# Patient Record
Sex: Female | Born: 1950 | Race: White | Hispanic: No | Marital: Single | Smoking: Never smoker
Health system: Southern US, Community
[De-identification: ages and names within clinical notes are randomized; demographics above are authoritative.]

## PROBLEM LIST (undated history)

## (undated) DIAGNOSIS — G4451 Hemicrania continua: Secondary | ICD-10-CM

## (undated) DIAGNOSIS — N189 Chronic kidney disease, unspecified: Secondary | ICD-10-CM

## (undated) DIAGNOSIS — H269 Unspecified cataract: Secondary | ICD-10-CM

## (undated) DIAGNOSIS — E785 Hyperlipidemia, unspecified: Secondary | ICD-10-CM

## (undated) DIAGNOSIS — E119 Type 2 diabetes mellitus without complications: Secondary | ICD-10-CM

## (undated) DIAGNOSIS — E039 Hypothyroidism, unspecified: Secondary | ICD-10-CM

## (undated) DIAGNOSIS — M1990 Unspecified osteoarthritis, unspecified site: Secondary | ICD-10-CM

## (undated) DIAGNOSIS — Z9889 Other specified postprocedural states: Secondary | ICD-10-CM

## (undated) DIAGNOSIS — H3530 Unspecified macular degeneration: Secondary | ICD-10-CM

## (undated) DIAGNOSIS — R112 Nausea with vomiting, unspecified: Secondary | ICD-10-CM

## (undated) DIAGNOSIS — I509 Heart failure, unspecified: Secondary | ICD-10-CM

## (undated) DIAGNOSIS — E079 Disorder of thyroid, unspecified: Secondary | ICD-10-CM

## (undated) DIAGNOSIS — M4810 Ankylosing hyperostosis [Forestier], site unspecified: Secondary | ICD-10-CM

## (undated) DIAGNOSIS — K219 Gastro-esophageal reflux disease without esophagitis: Secondary | ICD-10-CM

## (undated) DIAGNOSIS — I10 Essential (primary) hypertension: Secondary | ICD-10-CM

## (undated) DIAGNOSIS — S42209A Unspecified fracture of upper end of unspecified humerus, initial encounter for closed fracture: Secondary | ICD-10-CM

## (undated) HISTORY — DX: Disorder of thyroid, unspecified: E07.9

## (undated) HISTORY — DX: Unspecified cataract: H26.9

## (undated) HISTORY — DX: Ankylosing hyperostosis (forestier), site unspecified: M48.10

## (undated) HISTORY — PX: OTHER SURGICAL HISTORY: SHX169

## (undated) HISTORY — PX: FRACTURE SURGERY: SHX138

## (undated) HISTORY — PX: APPENDECTOMY: SHX54

## (undated) HISTORY — PX: CHOLECYSTECTOMY: SHX55

## (undated) HISTORY — DX: Chronic kidney disease, unspecified: N18.9

## (undated) HISTORY — DX: Heart failure, unspecified: I50.9

## (undated) HISTORY — PX: BREAST BIOPSY: SHX20

## (undated) HISTORY — PX: BREAST SURGERY: SHX581

## (undated) HISTORY — DX: Hyperlipidemia, unspecified: E78.5

## (undated) HISTORY — PX: JOINT REPLACEMENT: SHX530

---

## 1993-12-16 DIAGNOSIS — K819 Cholecystitis, unspecified: Secondary | ICD-10-CM | POA: Insufficient documentation

## 1998-03-17 DIAGNOSIS — G4451 Hemicrania continua: Secondary | ICD-10-CM | POA: Insufficient documentation

## 2009-09-01 HISTORY — PX: COLONOSCOPY: SHX174

## 2010-08-01 HISTORY — PX: COLONOSCOPY: SHX174

## 2014-12-17 DIAGNOSIS — E669 Obesity, unspecified: Secondary | ICD-10-CM | POA: Insufficient documentation

## 2014-12-17 DIAGNOSIS — E1169 Type 2 diabetes mellitus with other specified complication: Secondary | ICD-10-CM | POA: Insufficient documentation

## 2015-09-02 HISTORY — PX: COLONOSCOPY: SHX174

## 2015-09-18 DIAGNOSIS — Z9889 Other specified postprocedural states: Secondary | ICD-10-CM | POA: Insufficient documentation

## 2015-12-17 DIAGNOSIS — K37 Unspecified appendicitis: Secondary | ICD-10-CM | POA: Insufficient documentation

## 2017-01-13 DIAGNOSIS — E782 Mixed hyperlipidemia: Secondary | ICD-10-CM | POA: Diagnosis not present

## 2017-01-13 DIAGNOSIS — N816 Rectocele: Secondary | ICD-10-CM | POA: Diagnosis not present

## 2017-01-13 DIAGNOSIS — N649 Disorder of breast, unspecified: Secondary | ICD-10-CM | POA: Diagnosis not present

## 2017-01-13 DIAGNOSIS — Z6839 Body mass index (BMI) 39.0-39.9, adult: Secondary | ICD-10-CM | POA: Diagnosis not present

## 2017-01-13 DIAGNOSIS — E119 Type 2 diabetes mellitus without complications: Secondary | ICD-10-CM | POA: Diagnosis not present

## 2017-01-13 DIAGNOSIS — G4451 Hemicrania continua: Secondary | ICD-10-CM | POA: Diagnosis not present

## 2017-01-13 DIAGNOSIS — E6609 Other obesity due to excess calories: Secondary | ICD-10-CM | POA: Diagnosis not present

## 2017-01-13 DIAGNOSIS — E039 Hypothyroidism, unspecified: Secondary | ICD-10-CM | POA: Diagnosis not present

## 2017-01-13 DIAGNOSIS — H3530 Unspecified macular degeneration: Secondary | ICD-10-CM | POA: Diagnosis not present

## 2017-01-13 DIAGNOSIS — K219 Gastro-esophageal reflux disease without esophagitis: Secondary | ICD-10-CM | POA: Diagnosis not present

## 2017-01-30 DIAGNOSIS — E782 Mixed hyperlipidemia: Secondary | ICD-10-CM | POA: Diagnosis not present

## 2017-01-30 DIAGNOSIS — E039 Hypothyroidism, unspecified: Secondary | ICD-10-CM | POA: Diagnosis not present

## 2017-01-30 DIAGNOSIS — E119 Type 2 diabetes mellitus without complications: Secondary | ICD-10-CM | POA: Diagnosis not present

## 2017-02-03 DIAGNOSIS — E039 Hypothyroidism, unspecified: Secondary | ICD-10-CM | POA: Diagnosis not present

## 2017-02-03 DIAGNOSIS — G4451 Hemicrania continua: Secondary | ICD-10-CM | POA: Diagnosis not present

## 2017-02-03 DIAGNOSIS — H3530 Unspecified macular degeneration: Secondary | ICD-10-CM | POA: Diagnosis not present

## 2017-02-03 DIAGNOSIS — N816 Rectocele: Secondary | ICD-10-CM | POA: Diagnosis not present

## 2017-02-03 DIAGNOSIS — E6609 Other obesity due to excess calories: Secondary | ICD-10-CM | POA: Diagnosis not present

## 2017-02-03 DIAGNOSIS — I10 Essential (primary) hypertension: Secondary | ICD-10-CM | POA: Diagnosis not present

## 2017-02-03 DIAGNOSIS — E782 Mixed hyperlipidemia: Secondary | ICD-10-CM | POA: Diagnosis not present

## 2017-02-03 DIAGNOSIS — K219 Gastro-esophageal reflux disease without esophagitis: Secondary | ICD-10-CM | POA: Diagnosis not present

## 2017-02-03 DIAGNOSIS — Z681 Body mass index (BMI) 19 or less, adult: Secondary | ICD-10-CM | POA: Diagnosis not present

## 2017-02-03 DIAGNOSIS — E119 Type 2 diabetes mellitus without complications: Secondary | ICD-10-CM | POA: Diagnosis not present

## 2017-04-18 DIAGNOSIS — M4810 Ankylosing hyperostosis [Forestier], site unspecified: Secondary | ICD-10-CM | POA: Insufficient documentation

## 2017-05-08 DIAGNOSIS — E039 Hypothyroidism, unspecified: Secondary | ICD-10-CM | POA: Diagnosis not present

## 2017-05-08 DIAGNOSIS — E119 Type 2 diabetes mellitus without complications: Secondary | ICD-10-CM | POA: Diagnosis not present

## 2017-05-12 DIAGNOSIS — G4451 Hemicrania continua: Secondary | ICD-10-CM | POA: Diagnosis not present

## 2017-05-12 DIAGNOSIS — K219 Gastro-esophageal reflux disease without esophagitis: Secondary | ICD-10-CM | POA: Diagnosis not present

## 2017-05-12 DIAGNOSIS — E039 Hypothyroidism, unspecified: Secondary | ICD-10-CM | POA: Diagnosis not present

## 2017-05-12 DIAGNOSIS — E119 Type 2 diabetes mellitus without complications: Secondary | ICD-10-CM | POA: Diagnosis not present

## 2017-05-12 DIAGNOSIS — E782 Mixed hyperlipidemia: Secondary | ICD-10-CM | POA: Diagnosis not present

## 2017-05-12 DIAGNOSIS — Z6841 Body Mass Index (BMI) 40.0 and over, adult: Secondary | ICD-10-CM | POA: Diagnosis not present

## 2017-05-12 DIAGNOSIS — I10 Essential (primary) hypertension: Secondary | ICD-10-CM | POA: Diagnosis not present

## 2017-05-12 DIAGNOSIS — N6489 Other specified disorders of breast: Secondary | ICD-10-CM | POA: Diagnosis not present

## 2017-05-12 DIAGNOSIS — N816 Rectocele: Secondary | ICD-10-CM | POA: Diagnosis not present

## 2017-05-12 DIAGNOSIS — Z23 Encounter for immunization: Secondary | ICD-10-CM | POA: Diagnosis not present

## 2017-05-12 DIAGNOSIS — E6609 Other obesity due to excess calories: Secondary | ICD-10-CM | POA: Diagnosis not present

## 2017-05-12 DIAGNOSIS — H3530 Unspecified macular degeneration: Secondary | ICD-10-CM | POA: Diagnosis not present

## 2017-08-28 ENCOUNTER — Other Ambulatory Visit: Payer: Self-pay

## 2017-09-08 ENCOUNTER — Other Ambulatory Visit: Payer: Self-pay | Admitting: Internal Medicine

## 2017-09-08 DIAGNOSIS — Z1231 Encounter for screening mammogram for malignant neoplasm of breast: Secondary | ICD-10-CM

## 2017-10-01 ENCOUNTER — Ambulatory Visit
Admission: RE | Admit: 2017-10-01 | Discharge: 2017-10-01 | Disposition: A | Discharge status: Home / Self Care | Payer: Medicare Other | Source: Ambulatory Visit | Attending: Internal Medicine | Admitting: Internal Medicine

## 2017-10-01 DIAGNOSIS — Z1231 Encounter for screening mammogram for malignant neoplasm of breast: Secondary | ICD-10-CM | POA: Diagnosis not present

## 2017-10-09 DIAGNOSIS — E039 Hypothyroidism, unspecified: Secondary | ICD-10-CM | POA: Diagnosis not present

## 2017-10-09 DIAGNOSIS — E119 Type 2 diabetes mellitus without complications: Secondary | ICD-10-CM | POA: Diagnosis not present

## 2017-10-09 DIAGNOSIS — E782 Mixed hyperlipidemia: Secondary | ICD-10-CM | POA: Diagnosis not present

## 2017-10-13 DIAGNOSIS — G4451 Hemicrania continua: Secondary | ICD-10-CM | POA: Diagnosis not present

## 2017-10-13 DIAGNOSIS — Z0001 Encounter for general adult medical examination with abnormal findings: Secondary | ICD-10-CM | POA: Diagnosis not present

## 2017-10-13 DIAGNOSIS — K219 Gastro-esophageal reflux disease without esophagitis: Secondary | ICD-10-CM | POA: Diagnosis not present

## 2017-10-13 DIAGNOSIS — E039 Hypothyroidism, unspecified: Secondary | ICD-10-CM | POA: Diagnosis not present

## 2017-10-13 DIAGNOSIS — M79605 Pain in left leg: Secondary | ICD-10-CM | POA: Diagnosis not present

## 2017-10-13 DIAGNOSIS — H3530 Unspecified macular degeneration: Secondary | ICD-10-CM | POA: Diagnosis not present

## 2017-10-13 DIAGNOSIS — E782 Mixed hyperlipidemia: Secondary | ICD-10-CM | POA: Diagnosis not present

## 2017-10-13 DIAGNOSIS — I10 Essential (primary) hypertension: Secondary | ICD-10-CM | POA: Diagnosis not present

## 2017-10-13 DIAGNOSIS — E119 Type 2 diabetes mellitus without complications: Secondary | ICD-10-CM | POA: Diagnosis not present

## 2017-10-23 DIAGNOSIS — I10 Essential (primary) hypertension: Secondary | ICD-10-CM | POA: Diagnosis not present

## 2017-11-06 DIAGNOSIS — H353112 Nonexudative age-related macular degeneration, right eye, intermediate dry stage: Secondary | ICD-10-CM | POA: Diagnosis not present

## 2017-11-24 DIAGNOSIS — M79671 Pain in right foot: Secondary | ICD-10-CM | POA: Diagnosis not present

## 2017-11-24 DIAGNOSIS — E118 Type 2 diabetes mellitus with unspecified complications: Secondary | ICD-10-CM | POA: Diagnosis not present

## 2017-11-24 DIAGNOSIS — L851 Acquired keratosis [keratoderma] palmaris et plantaris: Secondary | ICD-10-CM | POA: Diagnosis not present

## 2017-11-24 DIAGNOSIS — Q6689 Other  specified congenital deformities of feet: Secondary | ICD-10-CM | POA: Diagnosis not present

## 2017-12-15 DIAGNOSIS — E118 Type 2 diabetes mellitus with unspecified complications: Secondary | ICD-10-CM | POA: Diagnosis not present

## 2017-12-15 DIAGNOSIS — M79671 Pain in right foot: Secondary | ICD-10-CM | POA: Diagnosis not present

## 2017-12-15 DIAGNOSIS — Q6689 Other  specified congenital deformities of feet: Secondary | ICD-10-CM | POA: Diagnosis not present

## 2018-03-29 ENCOUNTER — Encounter (HOSPITAL_COMMUNITY): Payer: Self-pay

## 2018-03-29 ENCOUNTER — Emergency Department (HOSPITAL_COMMUNITY)
Admission: EM | Admit: 2018-03-29 | Discharge: 2018-03-29 | Disposition: A | Discharge status: Home / Self Care | Payer: Medicare Other | Attending: Emergency Medicine | Admitting: Emergency Medicine

## 2018-03-29 ENCOUNTER — Emergency Department (HOSPITAL_COMMUNITY): Payer: Medicare Other

## 2018-03-29 DIAGNOSIS — S42352A Displaced comminuted fracture of shaft of humerus, left arm, initial encounter for closed fracture: Secondary | ICD-10-CM | POA: Diagnosis not present

## 2018-03-29 DIAGNOSIS — M25519 Pain in unspecified shoulder: Secondary | ICD-10-CM | POA: Diagnosis not present

## 2018-03-29 DIAGNOSIS — Y999 Unspecified external cause status: Secondary | ICD-10-CM | POA: Insufficient documentation

## 2018-03-29 DIAGNOSIS — W108XXA Fall (on) (from) other stairs and steps, initial encounter: Secondary | ICD-10-CM | POA: Diagnosis not present

## 2018-03-29 DIAGNOSIS — Y939 Activity, unspecified: Secondary | ICD-10-CM | POA: Insufficient documentation

## 2018-03-29 DIAGNOSIS — S49092A Other physeal fracture of upper end of humerus, left arm, initial encounter for closed fracture: Secondary | ICD-10-CM | POA: Insufficient documentation

## 2018-03-29 DIAGNOSIS — I10 Essential (primary) hypertension: Secondary | ICD-10-CM | POA: Insufficient documentation

## 2018-03-29 DIAGNOSIS — R609 Edema, unspecified: Secondary | ICD-10-CM | POA: Diagnosis not present

## 2018-03-29 DIAGNOSIS — Y929 Unspecified place or not applicable: Secondary | ICD-10-CM | POA: Diagnosis not present

## 2018-03-29 DIAGNOSIS — E119 Type 2 diabetes mellitus without complications: Secondary | ICD-10-CM | POA: Diagnosis not present

## 2018-03-29 DIAGNOSIS — R531 Weakness: Secondary | ICD-10-CM | POA: Diagnosis not present

## 2018-03-29 DIAGNOSIS — S42202A Unspecified fracture of upper end of left humerus, initial encounter for closed fracture: Secondary | ICD-10-CM | POA: Diagnosis not present

## 2018-03-29 DIAGNOSIS — S4292XA Fracture of left shoulder girdle, part unspecified, initial encounter for closed fracture: Secondary | ICD-10-CM

## 2018-03-29 DIAGNOSIS — S4992XA Unspecified injury of left shoulder and upper arm, initial encounter: Secondary | ICD-10-CM | POA: Diagnosis present

## 2018-03-29 HISTORY — DX: Hemicrania continua: G44.51

## 2018-03-29 HISTORY — DX: Essential (primary) hypertension: I10

## 2018-03-29 HISTORY — DX: Unspecified macular degeneration: H35.30

## 2018-03-29 HISTORY — DX: Type 2 diabetes mellitus without complications: E11.9

## 2018-03-29 HISTORY — DX: Gastro-esophageal reflux disease without esophagitis: K21.9

## 2018-03-29 HISTORY — DX: Unspecified osteoarthritis, unspecified site: M19.90

## 2018-03-29 MED ORDER — BACITRACIN ZINC 500 UNIT/GM EX OINT
1.0000 "application " | TOPICAL_OINTMENT | Freq: Two times a day (BID) | CUTANEOUS | Status: DC
  Administered 2018-03-29: 1 via TOPICAL
  Filled 2018-03-29: qty 0.9

## 2018-03-29 MED ORDER — HYDROMORPHONE HCL 1 MG/ML IJ SOLN
0.5000 mg | Freq: Once | INTRAMUSCULAR | Status: AC
Start: 2018-03-29 — End: 2018-03-29
  Administered 2018-03-29: 0.5 mg via INTRAMUSCULAR
  Filled 2018-03-29: qty 1

## 2018-03-29 MED ORDER — HYDROMORPHONE HCL 1 MG/ML IJ SOLN
1.0000 mg | Freq: Once | INTRAMUSCULAR | Status: AC
  Administered 2018-03-29: 1 mg via INTRAMUSCULAR
  Filled 2018-03-29: qty 1

## 2018-03-29 MED ORDER — OXYCODONE-ACETAMINOPHEN 5-325 MG PO TABS: 1.0000 | ORAL_TABLET | ORAL | 0 refills | Status: DC | PRN

## 2018-03-29 MED ORDER — ONDANSETRON 8 MG PO TBDP
8.0000 mg | ORAL_TABLET | Freq: Once | ORAL | Status: AC
  Administered 2018-03-29: 8 mg via ORAL
  Filled 2018-03-29: qty 1

## 2018-03-29 MED ORDER — OXYCODONE-ACETAMINOPHEN 5-325 MG PO TABS
ORAL_TABLET | ORAL | Status: AC
  Administered 2018-03-29: 1 via ORAL
  Filled 2018-03-29: qty 1

## 2018-03-29 MED ORDER — ONDANSETRON HCL 4 MG PO TABS: 4.0000 mg | ORAL_TABLET | Freq: Four times a day (QID) | ORAL | 0 refills | Status: DC

## 2018-03-29 MED ORDER — OXYCODONE-ACETAMINOPHEN 5-325 MG PO TABS
1.0000 | ORAL_TABLET | ORAL | Status: AC | PRN
  Administered 2018-03-29 (×2): 1 via ORAL

## 2018-03-29 NOTE — Discharge Instructions (Addendum)
I have spoken with Dr. [name] and he will see you in his office on Wednesday.  You can call his office tomorrow to arrange an appt time.  Continue to apply ice packs on/off.  Keep your arm in the sling.  Return here for any worsening symptoms

## 2018-03-29 NOTE — ED Provider Notes (Signed)
[facility] EMERGENCY DEPARTMENT Provider Note   CSN: [id number] Arrival date & time: 03/29/18  1537     History   Chief Complaint Chief Complaint  Patient presents with  . Fall  . Shoulder Pain    HPI Julie Bean is a 67 y.o. female.  HPI   Julie Bean is a 67 y.o. female who presents to the Emergency Department complaining of left shoulder pain after a mechanical fall down several brick steps, landing on her left arm.  She complains of severe pain to her left shoulder with movement.  She also complains of abrasions to her left knee and elbow.  She denies chest or rib pain, shortness of breath, low back or neck pain, head injury or LOC.  She denies numbness of the extremity.  Last TD is up-to-date.  She denies taking anticoagulants.   Past Medical History:  Diagnosis Date  . Arthritis    knees, shoulder, neck and hip  . Diabetes mellitus without complication ([place])   . GERD (gastroesophageal reflux disease)   . Headache, hemicrania continua   . Hypertension   . Macular degeneration     There are no active problems to display for this patient.   Past Surgical History:  Procedure Laterality Date  . APPENDECTOMY    . BREAST BIOPSY Left      OB History   None      Home Medications    Prior to Admission medications   Not on File    Family History No family history on file.  Social History Social History   Tobacco Use  . Smoking status: Never Smoker  Substance Use Topics  . Alcohol use: Never    Frequency: Never  . Drug use: Never     Allergies   Patient has no known allergies.   Review of Systems Review of Systems  Constitutional: Negative for chills and fever.  Eyes: Negative for visual disturbance.  Respiratory: Negative for shortness of breath.   Cardiovascular: Negative for chest pain.  Gastrointestinal: Negative for abdominal pain, nausea and vomiting.  Genitourinary: Negative for dysuria.  Musculoskeletal: Positive for  arthralgias (Left shoulder pain). Negative for back pain, joint swelling and neck pain.  Skin: Positive for wound. Negative for color change.       Abrasions left elbow and knee  Neurological: Negative for dizziness, syncope, weakness, numbness and headaches.  All other systems reviewed and are negative.    Physical Exam Updated Vital Signs BP 119/62 (BP Location: Right Arm)   Pulse 81   Temp 98.2 F (36.8 C) (Oral)   Resp 18   Ht 5\' 9"  (1.753 m)   Wt 124.7 kg (275 lb)   SpO2 94%   BMI 40.61 kg/m   Physical Exam  Constitutional: She is oriented to person, place, and time. She appears well-developed. No distress.  HENT:  Head: Atraumatic.  Mouth/Throat: Oropharynx is clear and moist.  Eyes: Pupils are equal, round, and reactive to light. Conjunctivae and EOM are normal.  Neck: Normal range of motion, full passive range of motion without pain and phonation normal. Neck supple. No spinous process tenderness and no muscular tenderness present. Normal range of motion present.  Cardiovascular: Normal rate, regular rhythm and intact distal pulses.  Pulmonary/Chest: Effort normal and breath sounds normal. No respiratory distress. She exhibits no tenderness.  Abdominal: Soft. She exhibits no distension. There is no tenderness.  Musculoskeletal: She exhibits tenderness. She exhibits no edema or deformity.  diffuse ttp of the left  anterior and lateral shoulder.  No bony deformity.  No edema.  No open wounds.  Left wrist and elbow are non-tender.  Compartments are soft.  No spinal tenderness on exam  Neurological: She is alert and oriented to person, place, and time. She has normal strength. No sensory deficit.  CN II-XII grossly intact.  Speech clear.    Skin: Skin is warm. Capillary refill takes less than 2 seconds.  Abrasions to the left anterior knee and lateral left elbow.  No hematoma, bleeding or edema.    Psychiatric: She has a normal mood and affect.  Nursing note and vitals  reviewed.    ED Treatments / Results  Labs (all labs ordered are listed, but only abnormal results are displayed) Labs Reviewed - No data to display  EKG None  Radiology Dg Shoulder Left  Result Date: 03/29/2018 CLINICAL DATA:  67 year old female s/p fall landing on shoulder. Severe pain. EXAM: LEFT SHOULDER - 2+ VIEW COMPARISON:  None. FINDINGS: Severely comminuted and impacted proximal left humerus fracture involving the head and neck. Butterfly fragments of the head appear distracted from the left glenohumeral joint. The fractured neck appears mildly impacted on the inferior glenoid. The left scapula and clavicle appear to remain intact. IMPRESSION: Severely comminuted and impacted proximal left humerus fracture. Electronically Signed   By: Genevie Ann M.D.   On: 03/29/2018 16:18    Procedures Procedures (including critical care time)  Medications Ordered in ED Medications  HYDROmorphone (DILAUDID) injection 1 mg (has no administration in time range)  ondansetron (ZOFRAN-ODT) disintegrating tablet 8 mg (has no administration in time range)  bacitracin ointment 1 application (has no administration in time range)  oxyCODONE-acetaminophen (PERCOCET/ROXICET) 5-325 MG per tablet 1 tablet (1 tablet Oral Given by Other 03/29/18 1545)     Initial Impression / Assessment and Plan / ED Course  I have reviewed the triage vital signs and the nursing notes.  Pertinent labs & imaging results that were available during my care of the patient were reviewed by me and considered in my medical decision making (see chart for details).    Patient with closed fracture of the left proximal humerus secondary to mechanical fall.  Neurovascularly intact.  Compartments are soft.  Left wrist and elbow are nontender.  Abrasions cleaned and bandaged.   [name]. Julie Bean.  Recommended sling and will see pt in office on Wednesday of this week.  Patient agrees to plan and verbalized  understanding    Final Clinical Impressions(s) / ED Diagnoses   Final diagnoses:  Closed fracture of left shoulder, initial encounter    ED Discharge Orders    None       Bufford Lope 03/30/18 0117    Dorie Rank, MD 03/30/18 2224

## 2018-03-29 NOTE — ED Triage Notes (Signed)
Pt brought in by EMS. Pt reports falling down 4 brick steps landing on left shoulder and side. Complaining  Of left shoulder pain and abraison to left knee

## 2018-03-30 ENCOUNTER — Other Ambulatory Visit: Payer: Self-pay | Admitting: Orthopaedic Surgery

## 2018-03-30 DIAGNOSIS — M25512 Pain in left shoulder: Principal | ICD-10-CM

## 2018-03-30 DIAGNOSIS — G8929 Other chronic pain: Secondary | ICD-10-CM

## 2018-03-30 DIAGNOSIS — Z6841 Body Mass Index (BMI) 40.0 and over, adult: Secondary | ICD-10-CM | POA: Diagnosis not present

## 2018-03-30 DIAGNOSIS — S42295A Other nondisplaced fracture of upper end of left humerus, initial encounter for closed fracture: Secondary | ICD-10-CM | POA: Diagnosis not present

## 2018-04-01 ENCOUNTER — Other Ambulatory Visit: Payer: Self-pay

## 2018-04-01 NOTE — Pre-Procedure Instructions (Signed)
Julie Bean  04/01/2018      [address], [place] [address] Phone: [phone] Fax: [phone]    Your procedure is scheduled on Wed., Aug. 7, 2019 from 3:28PM-5:56PM  Report to [facility] Admitting Entrance "A" at 1:25PM  Call this number if you have problems the morning of surgery:  [phone]   Remember:  Do not eat or drink after midnight on Aug. 6th    Take these medicines the morning of surgery with A SIP OF WATER: Divalproex (DEPAKOTE), Levothyroxine (SYNTHROID, LEVOTHROID), Omeprazole (PRILOSEC), and Ondansetron (ZOFRAN)   If needed OxyCODONE-acetaminophen (PERCOCET/ROXICET)   As of today, stop taking all Other Aspirin Products, Vitamins, Fish oils, and Herbal medications. Also stop all NSAIDS i.e. Advil, Ibuprofen, Motrin, Aleve, Anaprox, Naproxen, BC, Goody Powders, and all Supplements.  How to Manage Your Diabetes Before and After Surgery  Why is it important to control my blood sugar before and after surgery? . Improving blood sugar levels before and after surgery helps healing and can limit problems. . A way of improving blood sugar control is eating a healthy diet by: o  Eating less sugar and carbohydrates o  Increasing activity/exercise o  Talking with your doctor about reaching your blood sugar goals . High blood sugars (greater than 180 mg/dL) can raise your risk of infections and slow your recovery, so you will need to focus on controlling your diabetes during the weeks before surgery. . Make sure that the doctor who takes care of your diabetes knows about your planned surgery including the date and location.  How do I manage my blood sugar before surgery? . Check your blood sugar at least 4 times a day, starting 2 days before surgery, to make sure that the level is not too high or low. o Check your blood sugar the morning of your surgery when you wake up and every 2 hours  until you get to the Short Stay unit. . If your blood sugar is less than 70 mg/dL, you will need to treat for low blood sugar: o Do not take insulin. o Treat a low blood sugar (less than 70 mg/dL) with  cup of clear juice (cranberry or apple), 4 glucose tablets, OR glucose gel. Recheck blood sugar in 15 minutes after treatment (to make sure it is greater than 70 mg/dL). If your blood sugar is not greater than 70 mg/dL on recheck, call [phone] o  for further instructions. . Report your blood sugar to the short stay nurse when you get to Short Stay. .  If your CBG is greater than 220 mg/dL, call the number above for further instructions.  . If you are admitted to the hospital after surgery: o Your blood sugar will be checked by the staff and you will probably be given insulin after surgery (instead of oral diabetes medicines) to make sure you have good blood sugar levels. o The goal for blood sugar control after surgery is 80-180 mg/dL.  WHAT DO I DO ABOUT MY DIABETES MEDICATION?  Marland Kitchen Do not take MetFORMIN (GLUCOPHAGE) the morning of surgery.  Reviewed and Endorsed by [facility] Patient Education Committee, August 2015   Do not wear jewelry, make-up or nail polish.  Do not wear lotions, powders, or perfumes, or deodorant.  Do not shave 48 hours prior to surgery.    Do not bring valuables to the hospital.  [facility] is not responsible for any  belongings or valuables.  Contacts, dentures or bridgework may not be worn into surgery.  Leave your suitcase in the car.  After surgery it may be brought to your room.  For patients admitted to the hospital, discharge time will be determined by your treatment team.  Patients discharged the day of surgery will not be allowed to drive home.   Special instructions:   [facility]- Preparing For Surgery  Before surgery, you can play an important role. Because skin is not sterile, your skin needs to be as free of germs as possible. You can  reduce the number of germs on your skin by washing with CHG (chlorahexidine gluconate) Soap before surgery.  CHG is an antiseptic cleaner which kills germs and bonds with the skin to continue killing germs even after washing.    Oral Hygiene is also important to reduce your risk of infection.  Remember - BRUSH YOUR TEETH THE MORNING OF SURGERY WITH YOUR REGULAR TOOTHPASTE  Please do not use if you have an allergy to CHG or antibacterial soaps. If your skin becomes reddened/irritated stop using the CHG.  Do not shave (including legs and underarms) for at least 48 hours prior to first CHG shower. It is OK to shave your face.  Please follow these instructions carefully.   1. Shower the NIGHT BEFORE SURGERY and the MORNING OF SURGERY with CHG.   2. If you chose to wash your hair, wash your hair first as usual with your normal shampoo.  3. After you shampoo, rinse your hair and body thoroughly to remove the shampoo.  4. Use CHG as you would any other liquid soap. You can apply CHG directly to the skin and wash gently with a scrungie or a clean washcloth.   5. Apply the CHG Soap to your body ONLY FROM THE NECK DOWN.  Do not use on open wounds or open sores. Avoid contact with your eyes, ears, mouth and genitals (private parts). Wash Face and genitals (private parts)  with your normal soap.  6. Wash thoroughly, paying special attention to the area where your surgery will be performed.  7. Thoroughly rinse your body with warm water from the neck down.  8. DO NOT shower/wash with your normal soap after using and rinsing off the CHG Soap.  9. Pat yourself dry with a CLEAN TOWEL.  10. Wear CLEAN PAJAMAS to bed the night before surgery, wear comfortable clothes the morning of surgery  11. Place CLEAN SHEETS on your bed the night of your first shower and DO NOT SLEEP WITH PETS.  Day of Surgery:  Do not apply any deodorants/lotions.  Please wear clean clothes to the hospital/surgery center.    Remember to brush your teeth WITH YOUR REGULAR TOOTHPASTE.  Please read over the following fact sheets that you were given. Pain Booklet, Coughing and Deep Breathing, MRSA Information and Surgical Site Infection Prevention

## 2018-04-02 ENCOUNTER — Other Ambulatory Visit: Payer: Self-pay

## 2018-04-02 ENCOUNTER — Encounter (HOSPITAL_COMMUNITY)
Admission: RE | Admit: 2018-04-02 | Discharge: 2018-04-02 | Disposition: A | Discharge status: Home / Self Care | Payer: Medicare Other | Source: Ambulatory Visit | Attending: Orthopaedic Surgery | Admitting: Orthopaedic Surgery

## 2018-04-02 ENCOUNTER — Encounter (HOSPITAL_COMMUNITY): Payer: Self-pay

## 2018-04-02 DIAGNOSIS — R9431 Abnormal electrocardiogram [ECG] [EKG]: Secondary | ICD-10-CM | POA: Diagnosis not present

## 2018-04-02 DIAGNOSIS — Z01818 Encounter for other preprocedural examination: Secondary | ICD-10-CM | POA: Diagnosis not present

## 2018-04-02 DIAGNOSIS — S4292XA Fracture of left shoulder girdle, part unspecified, initial encounter for closed fracture: Secondary | ICD-10-CM | POA: Insufficient documentation

## 2018-04-02 DIAGNOSIS — Z01810 Encounter for preprocedural cardiovascular examination: Secondary | ICD-10-CM | POA: Diagnosis not present

## 2018-04-02 DIAGNOSIS — X58XXXA Exposure to other specified factors, initial encounter: Secondary | ICD-10-CM | POA: Insufficient documentation

## 2018-04-02 HISTORY — DX: Other specified postprocedural states: Z98.890

## 2018-04-02 HISTORY — DX: Unspecified fracture of upper end of unspecified humerus, initial encounter for closed fracture: S42.209A

## 2018-04-02 HISTORY — DX: Hypothyroidism, unspecified: E03.9

## 2018-04-02 HISTORY — DX: Nausea with vomiting, unspecified: R11.2

## 2018-04-02 LAB — BASIC METABOLIC PANEL
Anion gap: 15 (ref 5–15)
BUN: 36 mg/dL — ABNORMAL HIGH (ref 8–23)
CO2: 23 mmol/L (ref 22–32)
Calcium: 8.5 mg/dL — ABNORMAL LOW (ref 8.9–10.3)
Chloride: 94 mmol/L — ABNORMAL LOW (ref 98–111)
Creatinine, Ser: 1.01 mg/dL — ABNORMAL HIGH (ref 0.44–1.00)
GFR calc Af Amer: >60 mL/min (ref >60)
GFR calc non Af Amer: 57 mL/min — ABNORMAL LOW (ref >60)
Glucose, Bld: 147 mg/dL — ABNORMAL HIGH (ref 70–99)
Potassium: 4.7 mmol/L (ref 3.5–5.1)
Sodium: 132 mmol/L — ABNORMAL LOW (ref 135–145)

## 2018-04-02 LAB — CBC
HCT: 29.5 % — ABNORMAL LOW (ref 36.0–46.0)
Hemoglobin: 9.3 g/dL — ABNORMAL LOW (ref 12.0–15.0)
MCH: 29.8 pg (ref 26.0–34.0)
MCHC: 31.5 g/dL (ref 30.0–36.0)
MCV: 94.6 fL (ref 78.0–100.0)
Platelets: 240 10*3/uL (ref 150–400)
RBC: 3.12 MIL/uL — ABNORMAL LOW (ref 3.87–5.11)
RDW: 14.3 % (ref 11.5–15.5)
WBC: 9.7 10*3/uL (ref 4.0–10.5)

## 2018-04-02 LAB — SURGICAL PCR SCREEN
MRSA, PCR: NEGATIVE
Staphylococcus aureus: NEGATIVE

## 2018-04-02 LAB — GLUCOSE, CAPILLARY: Glucose-Capillary: 138 mg/dL — ABNORMAL HIGH (ref 70–99)

## 2018-04-02 LAB — HEMOGLOBIN A1C
Hgb A1c MFr Bld: 7.1 % — ABNORMAL HIGH (ref 4.8–5.6)
Mean Plasma Glucose: 157.07 mg/dL

## 2018-04-02 NOTE — Progress Notes (Signed)
PCP - Dr. Meade Maw  Cardiologist - Denies  Chest x-ray - Denies  EKG - 04/02/18  Stress Test - 20 yrs- Neg  ECHO - Denies  Cardiac Cath - Denies  Sleep Study - Denies CPAP - None  LABS- 04/02/18: CBC, BMP, PCR  ASA- Denies  HA1C- 04/02/18 Fasting Blood Sugar - 115-130, Today, 138 Checks Blood Sugar __1-2___ times a week  Anesthesia- No  Pt denies having chest pain, sob, or fever at this time. All instructions explained to the pt, with a verbal understanding of the material. Pt agrees to go over the instructions while at home for a better understanding. The opportunity to ask questions was provided.

## 2018-04-06 ENCOUNTER — Ambulatory Visit
Admission: RE | Admit: 2018-04-06 | Discharge: 2018-04-06 | Disposition: A | Discharge status: Home / Self Care | Payer: Medicare Other | Source: Ambulatory Visit | Attending: Orthopaedic Surgery | Admitting: Orthopaedic Surgery

## 2018-04-06 DIAGNOSIS — G8929 Other chronic pain: Secondary | ICD-10-CM

## 2018-04-06 DIAGNOSIS — M25512 Pain in left shoulder: Principal | ICD-10-CM

## 2018-04-06 DIAGNOSIS — S42212A Unspecified displaced fracture of surgical neck of left humerus, initial encounter for closed fracture: Secondary | ICD-10-CM | POA: Diagnosis not present

## 2018-04-06 MED ORDER — DEXTROSE 5 % IV SOLN
3.0000 g | INTRAVENOUS | Status: AC
  Administered 2018-04-07: 3 g via INTRAVENOUS
  Filled 2018-04-06: qty 3

## 2018-04-06 MED ORDER — TRANEXAMIC ACID 1000 MG/10ML IV SOLN
1000.0000 mg | INTRAVENOUS | Status: AC
  Administered 2018-04-07: 1000 mg via INTRAVENOUS
  Filled 2018-04-06: qty 1100

## 2018-04-07 ENCOUNTER — Other Ambulatory Visit: Payer: Self-pay

## 2018-04-07 ENCOUNTER — Encounter (HOSPITAL_COMMUNITY): Payer: Self-pay | Admitting: General Practice

## 2018-04-07 ENCOUNTER — Inpatient Hospital Stay (HOSPITAL_COMMUNITY)
Admission: RE | Admit: 2018-04-07 | Discharge: 2018-04-08 | DRG: 483 | Disposition: A | Discharge status: Home / Self Care | Payer: Medicare Other | Source: Ambulatory Visit | Attending: Orthopaedic Surgery | Admitting: Orthopaedic Surgery

## 2018-04-07 ENCOUNTER — Inpatient Hospital Stay (HOSPITAL_COMMUNITY): Payer: Medicare Other

## 2018-04-07 ENCOUNTER — Inpatient Hospital Stay (HOSPITAL_COMMUNITY): Payer: Medicare Other | Admitting: Anesthesiology

## 2018-04-07 ENCOUNTER — Encounter (HOSPITAL_COMMUNITY)
Admission: RE | Disposition: A | Discharge status: Home / Self Care | Payer: Self-pay | Source: Ambulatory Visit | Attending: Orthopaedic Surgery

## 2018-04-07 DIAGNOSIS — Z79890 Hormone replacement therapy (postmenopausal): Secondary | ICD-10-CM

## 2018-04-07 DIAGNOSIS — Z79899 Other long term (current) drug therapy: Secondary | ICD-10-CM

## 2018-04-07 DIAGNOSIS — S42202A Unspecified fracture of upper end of left humerus, initial encounter for closed fracture: Secondary | ICD-10-CM | POA: Diagnosis not present

## 2018-04-07 DIAGNOSIS — K219 Gastro-esophageal reflux disease without esophagitis: Secondary | ICD-10-CM | POA: Diagnosis not present

## 2018-04-07 DIAGNOSIS — Z9049 Acquired absence of other specified parts of digestive tract: Secondary | ICD-10-CM | POA: Diagnosis not present

## 2018-04-07 DIAGNOSIS — Z96612 Presence of left artificial shoulder joint: Secondary | ICD-10-CM | POA: Diagnosis not present

## 2018-04-07 DIAGNOSIS — E119 Type 2 diabetes mellitus without complications: Secondary | ICD-10-CM | POA: Diagnosis not present

## 2018-04-07 DIAGNOSIS — M65812 Other synovitis and tenosynovitis, left shoulder: Secondary | ICD-10-CM | POA: Diagnosis not present

## 2018-04-07 DIAGNOSIS — S42292A Other displaced fracture of upper end of left humerus, initial encounter for closed fracture: Secondary | ICD-10-CM | POA: Diagnosis not present

## 2018-04-07 DIAGNOSIS — G8918 Other acute postprocedural pain: Secondary | ICD-10-CM | POA: Diagnosis not present

## 2018-04-07 DIAGNOSIS — E039 Hypothyroidism, unspecified: Secondary | ICD-10-CM | POA: Diagnosis present

## 2018-04-07 DIAGNOSIS — H3530 Unspecified macular degeneration: Secondary | ICD-10-CM | POA: Diagnosis not present

## 2018-04-07 DIAGNOSIS — Z7984 Long term (current) use of oral hypoglycemic drugs: Secondary | ICD-10-CM

## 2018-04-07 DIAGNOSIS — Z419 Encounter for procedure for purposes other than remedying health state, unspecified: Secondary | ICD-10-CM

## 2018-04-07 DIAGNOSIS — I10 Essential (primary) hypertension: Secondary | ICD-10-CM | POA: Diagnosis not present

## 2018-04-07 DIAGNOSIS — Z471 Aftercare following joint replacement surgery: Secondary | ICD-10-CM | POA: Diagnosis not present

## 2018-04-07 DIAGNOSIS — W19XXXA Unspecified fall, initial encounter: Secondary | ICD-10-CM | POA: Diagnosis present

## 2018-04-07 DIAGNOSIS — Z09 Encounter for follow-up examination after completed treatment for conditions other than malignant neoplasm: Secondary | ICD-10-CM

## 2018-04-07 HISTORY — PX: REVERSE SHOULDER ARTHROPLASTY: SHX5054

## 2018-04-07 HISTORY — DX: Unspecified fracture of upper end of left humerus, initial encounter for closed fracture: S42.202A

## 2018-04-07 LAB — GLUCOSE, CAPILLARY
Glucose-Capillary: 114 mg/dL — ABNORMAL HIGH (ref 70–99)
Glucose-Capillary: 114 mg/dL — ABNORMAL HIGH (ref 70–99)
Glucose-Capillary: 143 mg/dL — ABNORMAL HIGH (ref 70–99)

## 2018-04-07 SURGERY — ARTHROPLASTY, SHOULDER, TOTAL, REVERSE
Anesthesia: Regional | Site: Shoulder | Laterality: Left

## 2018-04-07 MED ORDER — ROCURONIUM BROMIDE 10 MG/ML (PF) SYRINGE
PREFILLED_SYRINGE | INTRAVENOUS | Status: DC | PRN
  Administered 2018-04-07: 60 mg via INTRAVENOUS
  Administered 2018-04-07: 20 mg via INTRAVENOUS

## 2018-04-07 MED ORDER — DEXAMETHASONE SODIUM PHOSPHATE 10 MG/ML IJ SOLN
INTRAMUSCULAR | Status: AC
  Filled 2018-04-07: qty 1

## 2018-04-07 MED ORDER — HYDROMORPHONE HCL 1 MG/ML IJ SOLN: 0.5000 mg | INTRAMUSCULAR | Status: DC | PRN

## 2018-04-07 MED ORDER — HYDROCODONE-ACETAMINOPHEN 7.5-325 MG PO TABS
1.0000 | ORAL_TABLET | Freq: Once | ORAL | Status: DC | PRN
Start: 2018-04-07 — End: 2018-04-07

## 2018-04-07 MED ORDER — GLYCOPYRROLATE PF 0.2 MG/ML IJ SOSY
PREFILLED_SYRINGE | INTRAMUSCULAR | Status: AC
  Filled 2018-04-07: qty 1

## 2018-04-07 MED ORDER — BUPIVACAINE HCL (PF) 0.25 % IJ SOLN
INTRAMUSCULAR | Status: AC
  Filled 2018-04-07: qty 30

## 2018-04-07 MED ORDER — ONDANSETRON HCL 4 MG/2ML IJ SOLN
4.0000 mg | Freq: Four times a day (QID) | INTRAMUSCULAR | Status: DC | PRN
  Administered 2018-04-08: 4 mg via INTRAVENOUS
  Filled 2018-04-07: qty 2

## 2018-04-07 MED ORDER — LIDOCAINE 2% (20 MG/ML) 5 ML SYRINGE
INTRAMUSCULAR | Status: AC
  Filled 2018-04-07: qty 5

## 2018-04-07 MED ORDER — LEVOTHYROXINE SODIUM 75 MCG PO TABS
150.0000 ug | ORAL_TABLET | Freq: Every day | ORAL | Status: DC
  Administered 2018-04-08: 150 ug via ORAL
  Filled 2018-04-07: qty 2

## 2018-04-07 MED ORDER — CLONIDINE HCL (ANALGESIA) 100 MCG/ML EP SOLN
EPIDURAL | Status: DC | PRN
  Administered 2018-04-07: 50 ug

## 2018-04-07 MED ORDER — HYDROMORPHONE HCL 1 MG/ML IJ SOLN: 0.2500 mg | INTRAMUSCULAR | Status: DC | PRN

## 2018-04-07 MED ORDER — ESMOLOL HCL 100 MG/10ML IV SOLN
INTRAVENOUS | Status: DC | PRN
  Administered 2018-04-07: 40 mg via INTRAVENOUS

## 2018-04-07 MED ORDER — MIDAZOLAM HCL 2 MG/2ML IJ SOLN
1.0000 mg | Freq: Once | INTRAMUSCULAR | Status: AC
  Administered 2018-04-07: 1 mg via INTRAVENOUS

## 2018-04-07 MED ORDER — ROPIVACAINE HCL 5 MG/ML IJ SOLN
INTRAMUSCULAR | Status: DC | PRN
  Administered 2018-04-07: 30 mL via PERINEURAL

## 2018-04-07 MED ORDER — ZOLPIDEM TARTRATE 5 MG PO TABS
5.0000 mg | ORAL_TABLET | Freq: Every evening | ORAL | Status: DC | PRN
  Administered 2018-04-07: 5 mg via ORAL
  Filled 2018-04-07: qty 1

## 2018-04-07 MED ORDER — FENTANYL CITRATE (PF) 250 MCG/5ML IJ SOLN
INTRAMUSCULAR | Status: AC
  Filled 2018-04-07: qty 5

## 2018-04-07 MED ORDER — ACETAMINOPHEN 10 MG/ML IV SOLN: 1000.0000 mg | Freq: Once | INTRAVENOUS | Status: DC | PRN

## 2018-04-07 MED ORDER — SUGAMMADEX SODIUM 200 MG/2ML IV SOLN
INTRAVENOUS | Status: DC | PRN
  Administered 2018-04-07: 250 mg via INTRAVENOUS

## 2018-04-07 MED ORDER — DIPHENHYDRAMINE HCL 12.5 MG/5ML PO ELIX: 12.5000 mg | ORAL_SOLUTION | ORAL | Status: DC | PRN

## 2018-04-07 MED ORDER — PROPOFOL 10 MG/ML IV BOLUS
INTRAVENOUS | Status: AC
  Filled 2018-04-07: qty 20

## 2018-04-07 MED ORDER — CHLORHEXIDINE GLUCONATE 4 % EX LIQD: 60.0000 mL | Freq: Once | CUTANEOUS | Status: DC

## 2018-04-07 MED ORDER — PHENYLEPHRINE 40 MCG/ML (10ML) SYRINGE FOR IV PUSH (FOR BLOOD PRESSURE SUPPORT)
PREFILLED_SYRINGE | INTRAVENOUS | Status: AC
  Filled 2018-04-07: qty 10

## 2018-04-07 MED ORDER — PANTOPRAZOLE SODIUM 40 MG PO TBEC
80.0000 mg | DELAYED_RELEASE_TABLET | Freq: Every day | ORAL | Status: DC
  Administered 2018-04-07 – 2018-04-08 (×2): 80 mg via ORAL
  Filled 2018-04-07 (×2): qty 2

## 2018-04-07 MED ORDER — SODIUM CHLORIDE 0.9 % IV SOLN
INTRAVENOUS | Status: DC | PRN
  Administered 2018-04-07: 40 ug/min via INTRAVENOUS

## 2018-04-07 MED ORDER — PRAVASTATIN SODIUM 40 MG PO TABS
40.0000 mg | ORAL_TABLET | Freq: Every day | ORAL | Status: DC
  Administered 2018-04-07 – 2018-04-08 (×2): 40 mg via ORAL
  Filled 2018-04-07 (×2): qty 1

## 2018-04-07 MED ORDER — ONDANSETRON HCL 4 MG/2ML IJ SOLN
4.0000 mg | Freq: Once | INTRAMUSCULAR | Status: DC | PRN
Start: 2018-04-07 — End: 2018-04-07

## 2018-04-07 MED ORDER — EPHEDRINE SULFATE-NACL 50-0.9 MG/10ML-% IV SOSY
PREFILLED_SYRINGE | INTRAVENOUS | Status: DC | PRN
  Administered 2018-04-07 (×3): 10 mg via INTRAVENOUS

## 2018-04-07 MED ORDER — SUGAMMADEX SODIUM 500 MG/5ML IV SOLN
INTRAVENOUS | Status: AC
  Filled 2018-04-07: qty 5

## 2018-04-07 MED ORDER — IRBESARTAN 150 MG PO TABS
150.0000 mg | ORAL_TABLET | Freq: Every day | ORAL | Status: DC
  Administered 2018-04-07 – 2018-04-08 (×2): 150 mg via ORAL
  Filled 2018-04-07 (×2): qty 1

## 2018-04-07 MED ORDER — ACETAMINOPHEN 500 MG PO TABS
1000.0000 mg | ORAL_TABLET | Freq: Three times a day (TID) | ORAL | Status: DC
  Administered 2018-04-07 – 2018-04-08 (×2): 1000 mg via ORAL
  Filled 2018-04-07 (×2): qty 2

## 2018-04-07 MED ORDER — METFORMIN HCL 500 MG PO TABS
500.0000 mg | ORAL_TABLET | Freq: Two times a day (BID) | ORAL | Status: DC
  Administered 2018-04-08: 500 mg via ORAL
  Filled 2018-04-07: qty 1

## 2018-04-07 MED ORDER — DIVALPROEX SODIUM 500 MG PO DR TAB
500.0000 mg | DELAYED_RELEASE_TABLET | Freq: Two times a day (BID) | ORAL | Status: DC
  Administered 2018-04-07 – 2018-04-08 (×2): 500 mg via ORAL
  Filled 2018-04-07 (×2): qty 1

## 2018-04-07 MED ORDER — FENTANYL CITRATE (PF) 100 MCG/2ML IJ SOLN
INTRAMUSCULAR | Status: AC
  Administered 2018-04-07: 50 ug via INTRAVENOUS
  Filled 2018-04-07: qty 2

## 2018-04-07 MED ORDER — PROPOFOL 10 MG/ML IV BOLUS
INTRAVENOUS | Status: DC | PRN
  Administered 2018-04-07: 60 mg via INTRAVENOUS
  Administered 2018-04-07: 600 mg via INTRAVENOUS
  Administered 2018-04-07: 140 mg via INTRAVENOUS

## 2018-04-07 MED ORDER — OXYCODONE HCL 5 MG PO TABS
5.0000 mg | ORAL_TABLET | ORAL | Status: DC | PRN
  Administered 2018-04-08: 10 mg via ORAL
  Administered 2018-04-08: 5 mg via ORAL
  Administered 2018-04-08: 10 mg via ORAL
  Filled 2018-04-07 (×2): qty 2

## 2018-04-07 MED ORDER — ROCURONIUM BROMIDE 10 MG/ML (PF) SYRINGE
PREFILLED_SYRINGE | INTRAVENOUS | Status: AC
  Filled 2018-04-07: qty 10

## 2018-04-07 MED ORDER — DEXAMETHASONE SODIUM PHOSPHATE 10 MG/ML IJ SOLN
INTRAMUSCULAR | Status: DC | PRN
  Administered 2018-04-07: 5 mg via INTRAVENOUS

## 2018-04-07 MED ORDER — EPHEDRINE 5 MG/ML INJ
INTRAVENOUS | Status: AC
  Filled 2018-04-07: qty 10

## 2018-04-07 MED ORDER — BUPIVACAINE HCL (PF) 0.25 % IJ SOLN
INTRAMUSCULAR | Status: DC | PRN
  Administered 2018-04-07: 10 mL

## 2018-04-07 MED ORDER — HYDROCHLOROTHIAZIDE 25 MG PO TABS
12.5000 mg | ORAL_TABLET | Freq: Every day | ORAL | Status: DC
  Administered 2018-04-07: 12.5 mg via ORAL
  Filled 2018-04-07: qty 1

## 2018-04-07 MED ORDER — ONDANSETRON HCL 4 MG PO TABS: 4.0000 mg | ORAL_TABLET | Freq: Four times a day (QID) | ORAL | Status: DC | PRN

## 2018-04-07 MED ORDER — LACTATED RINGERS IV SOLN
INTRAVENOUS | Status: DC
  Administered 2018-04-07 (×2): via INTRAVENOUS

## 2018-04-07 MED ORDER — ONDANSETRON HCL 4 MG/2ML IJ SOLN
INTRAMUSCULAR | Status: AC
  Filled 2018-04-07: qty 2

## 2018-04-07 MED ORDER — METOCLOPRAMIDE HCL 5 MG/ML IJ SOLN
5.0000 mg | Freq: Three times a day (TID) | INTRAMUSCULAR | Status: DC | PRN
  Administered 2018-04-08: 10 mg via INTRAVENOUS
  Filled 2018-04-07: qty 2

## 2018-04-07 MED ORDER — MIDAZOLAM HCL 2 MG/2ML IJ SOLN
INTRAMUSCULAR | Status: AC
  Administered 2018-04-07: 1 mg via INTRAVENOUS
  Filled 2018-04-07: qty 2

## 2018-04-07 MED ORDER — CEFAZOLIN SODIUM-DEXTROSE 1-4 GM/50ML-% IV SOLN
1.0000 g | Freq: Four times a day (QID) | INTRAVENOUS | Status: AC
  Administered 2018-04-07 – 2018-04-08 (×3): 1 g via INTRAVENOUS
  Filled 2018-04-07 (×3): qty 50

## 2018-04-07 MED ORDER — FENTANYL CITRATE (PF) 250 MCG/5ML IJ SOLN
INTRAMUSCULAR | Status: DC | PRN
  Administered 2018-04-07: 50 ug via INTRAVENOUS
  Administered 2018-04-07: 100 ug via INTRAVENOUS
  Administered 2018-04-07: 50 ug via INTRAVENOUS

## 2018-04-07 MED ORDER — FENTANYL CITRATE (PF) 100 MCG/2ML IJ SOLN
50.0000 ug | Freq: Once | INTRAMUSCULAR | Status: AC
  Administered 2018-04-07: 50 ug via INTRAVENOUS

## 2018-04-07 MED ORDER — 0.9 % SODIUM CHLORIDE (POUR BTL) OPTIME
TOPICAL | Status: DC | PRN
  Administered 2018-04-07: 1000 mL

## 2018-04-07 MED ORDER — ONDANSETRON HCL 4 MG/2ML IJ SOLN
INTRAMUSCULAR | Status: DC | PRN
  Administered 2018-04-07: 4 mg via INTRAVENOUS

## 2018-04-07 MED ORDER — LIDOCAINE 2% (20 MG/ML) 5 ML SYRINGE
INTRAMUSCULAR | Status: DC | PRN
  Administered 2018-04-07: 100 mg via INTRAVENOUS

## 2018-04-07 MED ORDER — PHENYLEPHRINE 40 MCG/ML (10ML) SYRINGE FOR IV PUSH (FOR BLOOD PRESSURE SUPPORT)
PREFILLED_SYRINGE | INTRAVENOUS | Status: DC | PRN
  Administered 2018-04-07: 120 ug via INTRAVENOUS
  Administered 2018-04-07: 80 ug via INTRAVENOUS
  Administered 2018-04-07 (×3): 120 ug via INTRAVENOUS

## 2018-04-07 MED ORDER — METOCLOPRAMIDE HCL 5 MG PO TABS: 5.0000 mg | ORAL_TABLET | Freq: Three times a day (TID) | ORAL | Status: DC | PRN

## 2018-04-07 MED ORDER — SODIUM CHLORIDE 0.9 % IR SOLN
Status: DC | PRN
  Administered 2018-04-07: 1000 mL

## 2018-04-07 MED ORDER — DOCUSATE SODIUM 100 MG PO CAPS
100.0000 mg | ORAL_CAPSULE | Freq: Two times a day (BID) | ORAL | Status: DC
  Administered 2018-04-08: 100 mg via ORAL
  Filled 2018-04-07 (×2): qty 1

## 2018-04-07 MED ORDER — INDOMETHACIN 25 MG PO CAPS
50.0000 mg | ORAL_CAPSULE | Freq: Two times a day (BID) | ORAL | Status: DC
  Administered 2018-04-08: 50 mg via ORAL
  Filled 2018-04-07 (×2): qty 2

## 2018-04-07 MED ORDER — MEPERIDINE HCL 50 MG/ML IJ SOLN: 6.2500 mg | INTRAMUSCULAR | Status: DC | PRN

## 2018-04-07 SURGICAL SUPPLY — 63 items
BASEPLATE GLENOSPHERE 25 STD (Miscellaneous) ×2 IMPLANT
BIT DRILL 3.2 PERIPHERAL SCREW (BIT) ×2 IMPLANT
BLADE SAW SAG 73X25 THK (BLADE) ×1
BLADE SAW SGTL 73X25 THK (BLADE) ×1 IMPLANT
BODY PROXIMAL PTC 13X132.5 (Joint) ×1 IMPLANT
CAP LOCKING COCR (Cap) ×2 IMPLANT
CHLORAPREP W/TINT 26ML (MISCELLANEOUS) ×4 IMPLANT
COVER SURGICAL LIGHT HANDLE (MISCELLANEOUS) ×2 IMPLANT
DRAPE C-ARM 42X72 X-RAY (DRAPES) IMPLANT
DRAPE HALF SHEET 40X57 (DRAPES) ×2 IMPLANT
DRAPE INCISE IOBAN 66X45 STRL (DRAPES) ×4 IMPLANT
DRAPE ORTHO SPLIT 77X108 STRL (DRAPES) ×2
DRAPE SURG ORHT 6 SPLT 77X108 (DRAPES) ×2 IMPLANT
DRAPE SWITCH (DRAPES) ×2 IMPLANT
DRAPE U-SHAPE 47X51 STRL (DRAPES) IMPLANT
DRSG AQUACEL AG ADV 3.5X 6 (GAUZE/BANDAGES/DRESSINGS) ×2 IMPLANT
ELECT REM PT RETURN 9FT ADLT (ELECTROSURGICAL) ×2
ELECTRODE REM PT RTRN 9FT ADLT (ELECTROSURGICAL) ×1 IMPLANT
GLENOSPHERE REV SHOULDER 36 (Joint) ×2 IMPLANT
GLOVE BIOGEL PI IND STRL 8 (GLOVE) ×1 IMPLANT
GLOVE BIOGEL PI INDICATOR 8 (GLOVE) ×1
GLOVE ECLIPSE 8.0 STRL XLNG CF (GLOVE) ×4 IMPLANT
GOWN STRL REUS W/ TWL LRG LVL3 (GOWN DISPOSABLE) ×1 IMPLANT
GOWN STRL REUS W/ TWL XL LVL3 (GOWN DISPOSABLE) ×1 IMPLANT
GOWN STRL REUS W/TWL LRG LVL3 (GOWN DISPOSABLE) ×1
GOWN STRL REUS W/TWL XL LVL3 (GOWN DISPOSABLE) ×1
GUIDEWIRE GLENOID 2.5X220 (WIRE) ×2 IMPLANT
HANDPIECE INTERPULSE COAX TIP (DISPOSABLE) ×1
IMPL REVERSE SHOULDER 0X3.5 (Shoulder) ×1 IMPLANT
IMPLANT REVERSE SHOULDER 0X3.5 (Shoulder) ×2 IMPLANT
INSERT HUMERAL 36X6MM 12.5DEG (Insert) ×2 IMPLANT
KIT BASIN OR (CUSTOM PROCEDURE TRAY) ×2 IMPLANT
KIT STABILIZATION SHOULDER (MISCELLANEOUS) ×2 IMPLANT
KIT TURNOVER KIT B (KITS) ×2 IMPLANT
MANIFOLD NEPTUNE II (INSTRUMENTS) ×2 IMPLANT
NEEDLE HYPO 25GX1X1/2 BEV (NEEDLE) IMPLANT
NEEDLE MAYO TROCAR (NEEDLE) IMPLANT
NS IRRIG 1000ML POUR BTL (IV SOLUTION) ×2 IMPLANT
PACK SHOULDER (CUSTOM PROCEDURE TRAY) ×2 IMPLANT
PAD ARMBOARD 7.5X6 YLW CONV (MISCELLANEOUS) ×4 IMPLANT
PROXIMAL BODY PTC 13X132.5 (Joint) ×2 IMPLANT
RESTRAINT HEAD UNIVERSAL NS (MISCELLANEOUS) ×2 IMPLANT
SCREW ASSEMBLY COCR TYPE 0 (Screw) ×2 IMPLANT
SCREW CENTRAL THREAD 6.5X45 (Screw) ×2 IMPLANT
SCREW PERIPHERAL 30 (Screw) ×2 IMPLANT
SCREW PERIPHERAL 5.0X46 (Screw) ×2 IMPLANT
SET HNDPC FAN SPRY TIP SCT (DISPOSABLE) ×1 IMPLANT
SLING ARM FOAM STRAP LRG (SOFTGOODS) ×2 IMPLANT
SPONGE LAP 18X18 X RAY DECT (DISPOSABLE) ×2 IMPLANT
SPRAY STEM-TI PLASMA 13X90 (Stem) ×2 IMPLANT
STRIP CLOSURE SKIN 1/2X4 (GAUZE/BANDAGES/DRESSINGS) ×2 IMPLANT
SUCTION FRAZIER HANDLE 10FR (MISCELLANEOUS)
SUCTION TUBE FRAZIER 10FR DISP (MISCELLANEOUS) IMPLANT
SUT ETHIBOND 2 V 37 (SUTURE) ×2 IMPLANT
SUT ETHIBOND NAB CT1 #1 30IN (SUTURE) ×2 IMPLANT
SUT FIBERWIRE #5 38 CONV NDL (SUTURE) ×8
SUT MNCRL AB 3-0 PS2 18 (SUTURE) ×2 IMPLANT
SUT VIC AB 2-0 CT1 27 (SUTURE) ×1
SUT VIC AB 2-0 CT1 TAPERPNT 27 (SUTURE) ×1 IMPLANT
SUTURE FIBERWR #5 38 CONV NDL (SUTURE) ×4 IMPLANT
TOWEL OR 17X26 10 PK STRL BLUE (TOWEL DISPOSABLE) ×2 IMPLANT
TRAY FOLEY W/BAG SLVR 14FR (SET/KITS/TRAYS/PACK) IMPLANT
WATER STERILE IRR 1000ML POUR (IV SOLUTION) ×2 IMPLANT

## 2018-04-07 NOTE — Anesthesia Procedure Notes (Addendum)
Anesthesia Regional Block: Interscalene brachial plexus block   Pre-Anesthetic Checklist: ,, timeout performed, Correct Patient, Correct Site, Correct Laterality, Correct Procedure, Correct Position, site marked, Risks and benefits discussed, at surgeon's request and post-op pain management  Laterality: Upper and Left  Prep: Betadine, chloraprep, alcohol swabs       Needles:  Injection technique: Single-shot  Needle Type: Stimulator Needle - 40      Needle Gauge: 22     Additional Needles:   Procedures:, nerve stimulator,,,,,,,  Narrative:  Start time: 04/07/2018 1:26 PM End time: 04/07/2018 1:40 PM  Performed by: Personally  Anesthesiologist: Barnet Glasgow, MD  Additional Notes: Block assessed prior to start of surgery

## 2018-04-07 NOTE — Anesthesia Procedure Notes (Signed)
Procedure Name: Intubation Date/Time: 04/07/2018 3:00 PM Performed by: Imagene Riches, CRNA Pre-anesthesia Checklist: Patient identified, Emergency Drugs available, Suction available and Patient being monitored Patient Re-evaluated:Patient Re-evaluated prior to induction Oxygen Delivery Method: Circle System Utilized Preoxygenation: Pre-oxygenation with 100% oxygen Induction Type: IV induction Ventilation: Two handed mask ventilation required and Oral airway inserted - appropriate to patient size Laryngoscope Size: Miller and 3 Grade View: Grade II Tube type: Oral Tube size: 7.0 mm Number of attempts: 1 Airway Equipment and Method: Stylet and Oral airway Placement Confirmation: ETT inserted through vocal cords under direct vision,  positive ETCO2 and breath sounds checked- equal and bilateral Secured at: 23 cm Tube secured with: Tape Dental Injury: Teeth and Oropharynx as per pre-operative assessment

## 2018-04-07 NOTE — Anesthesia Preprocedure Evaluation (Signed)
Anesthesia Evaluation  Patient identified by MRN, date of birth, ID band Patient awake    Reviewed: Allergy & Precautions, NPO status , Patient's Chart, lab work & pertinent test results  History of Anesthesia Complications (+) PONV  Airway Mallampati: III  TM Distance: <3 FB Neck ROM: Full   Comment: Short chin  Dental no notable dental hx. (+) Teeth Intact, Dental Advisory Given   Pulmonary neg pulmonary ROS,    Pulmonary exam normal breath sounds clear to auscultation       Cardiovascular hypertension, Pt. on medications Normal cardiovascular exam Rhythm:Regular Rate:Normal     Neuro/Psych  Headaches, negative psych ROS   GI/Hepatic GERD  ,  Endo/Other  diabetes, Well Controlled, Type 2Hypothyroidism Morbid obesity  Renal/GU      Musculoskeletal  (+) Arthritis ,   Abdominal (+) + obese,   Peds  Hematology   Anesthesia Other Findings   Reproductive/Obstetrics                             Lab Results  Component Value Date   WBC 9.7 04/02/2018   HGB 9.3 (L) 04/02/2018   HCT 29.5 (L) 04/02/2018   MCV 94.6 04/02/2018   PLT 240 04/02/2018     Anesthesia Physical Anesthesia Plan  ASA: III  Anesthesia Plan: General and Regional   Post-op Pain Management:  Regional for Post-op pain   Induction: Intravenous  PONV Risk Score and Plan: Treatment may vary due to age or medical condition, Ondansetron, Dexamethasone and Scopolamine patch - Pre-op  Airway Management Planned: Oral ETT  Additional Equipment:   Intra-op Plan:   Post-operative Plan: Extubation in OR  Informed Consent: I have reviewed the patients History and Physical, chart, labs and discussed the procedure including the risks, benefits and alternatives for the proposed anesthesia with the patient or authorized representative who has indicated his/her understanding and acceptance.   Dental advisory given  Plan  Discussed with: CRNA  Anesthesia Plan Comments: (w L interscalene block for post operative pain control)        Anesthesia Quick Evaluation

## 2018-04-07 NOTE — Transfer of Care (Signed)
Immediate Anesthesia Transfer of Care Note  Patient: Julie Bean  Procedure(s) Performed: REVERSE SHOULDER ARTHROPLASTY (Left Shoulder)  Patient Location: PACU  Anesthesia Type:GA combined with regional for post-op pain  Level of Consciousness: awake and alert   Airway & Oxygen Therapy: Patient Spontanous Breathing and Patient connected to nasal cannula oxygen  Post-op Assessment: Report given to RN and Post -op Vital signs reviewed and stable  Post vital signs: Reviewed and stable  Last Vitals:  Vitals Value Taken Time  BP 106/57 04/07/2018  5:41 PM  Temp    Pulse 87 04/07/2018  5:41 PM  Resp 20 04/07/2018  5:41 PM  SpO2 100 % 04/07/2018  5:41 PM  Vitals shown include unvalidated device data.  Last Pain:  Vitals:   04/07/18 1335  TempSrc:   PainSc: 3       Patients Stated Pain Goal: 3 (01/41/03 0131)  Complications: No apparent anesthesia complications

## 2018-04-07 NOTE — Anesthesia Postprocedure Evaluation (Signed)
Anesthesia Post Note  Patient: Julie Bean  Procedure(s) Performed: REVERSE SHOULDER ARTHROPLASTY (Left Shoulder)     Patient location during evaluation: PACU Anesthesia Type: Regional and General Level of consciousness: awake and alert Pain management: pain level controlled Vital Signs Assessment: post-procedure vital signs reviewed and stable Respiratory status: spontaneous breathing, nonlabored ventilation, respiratory function stable and patient connected to nasal cannula oxygen Cardiovascular status: blood pressure returned to baseline and stable Postop Assessment: no apparent nausea or vomiting Anesthetic complications: no    Last Vitals:  Vitals:   04/07/18 1325 04/07/18 1330  BP: (!) 88/54 (!) 112/57  Pulse: 80 76  Resp: 13 15  Temp:    SpO2: 96% 98%    Last Pain:  Vitals:   04/07/18 1335  TempSrc:   PainSc: 3                  Barnet Glasgow
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
## 2018-04-07 NOTE — Op Note (Signed)
Orthopaedic Surgery Operative Note (CSN: [id number])  Julie Bean  [birth year] Date of Surgery: 04/07/2018   Diagnoses:  left proximal humerus fracture with head split  Procedure: Left reverse total shoulder arthroplasty for fracture   Operative Finding Successful completion of planned procedure.  Head split fracture with multiple comminuted fragments of tuberosity noted.  Significant posterior shaft loss of bone precluded typical fixation and we require diaphyseal fixation with a long stem component.  There is a coracoid fracture that was minimally displaced and was left alone.  Significant capsular injury as well inferiorly.  Post-operative plan: The patient will be nonweightbearing and sling x4 weeks with delayed PT starting week 3-4.  The patient will be admitted overnight.  DVT prophylaxis indicated in the setting of upper extremity surgery and ambulatory patient.  Pain control with PRN pain medication preferring oral medicines.  Follow up plan will be scheduled in approximately 7 days for incision check and XR.  Post-Op Diagnosis: Same Surgeons:Primary: Hiram Gash, MD Assistants: Joya Gaskins, OPAC Location: [facility] OR ROOM [phone] Anesthesia: Choice Antibiotics: Ancef 2g preop Tourniquet time: * No tourniquets in log * Estimated Blood Loss: 993 Complications: None Specimens: None Implants: * No implants in log *  Indications for Surgery:   Julie Bean is a 67 y.o. female with fall resulting in a head split comminuted left proximal humerus fracture.  Talked about her options and she was not amenable based on her CT scan to ORIF.  Due to her age as well as her fracture pattern refill reverse social arthroplasty would be appropriate and nonoperative measures would predictably do poorly.  Benefits and risks of operative and nonoperative management were discussed prior to surgery with patient/guardian(s) and informed consent form was completed.  Specific risks including infection, need  for additional surgery, location axillary nerve injury and loss of motion.   Procedure:   The patient was identified in the preoperative holding area where the surgical site was marked. The patient was taken to the OR where a procedural timeout was called and the above noted anesthesia was induced.  The patient was positioned beachchair on [employer] table.  Preoperative antibiotics were dosed.  The patient's left shoulder was prepped and draped in the usual sterile fashion.  A second preoperative timeout was called.      Standard deltopectoral approach was performed with a #10 blade. We dissected down to the subcutaneous tissues and the cephalic vein was taken laterally with the deltoid. Clavipectoral fascia was incised in line with the incision. Deep retractors were placed.  Coracoid fracture was noted and will be treated nonoperatively.  The long of the biceps tendon was identified and there was significant tenosynovitis present.  Tenodesis was performed to the pectoralis tendon with #2 Ethibond. The remaining biceps was followed up into the rotator interval where it was released.    We used the bicipital groove as a landmark for the lesser and greater tuberosity fragments.  We were able to mobilize the lesser tuberosity fragment and placed stay sutures in the bone tendon junction to help with mobilization.  This point we were able to identify the  greater tuberosity fragment and 4 #5  FiberWire sutures were used to place into this for eventual repair of the tuberosities.  Once these were both mobilized we took care to identify the shaft fragment as well as the head fragment.  We carefully identified the head fragment were able to manually remove it.  There is a head split noted. At this point the  axillary nerve was found and palpated and with a tug test noted to be intact.  Protected throughout the remainder of the case with blunt retractors.    We then released the SGHL with bovie cautery prior to placing  a curved mayo at the junction of the anterior glenoid well above the axillary nerve and bluntly dissecting the subscapularis from the capsule.  We then carefully protected the axillary nerve as we gently released the inferior capsule to fully mobilize the subscapularis.  An anterior deltoid retractor was then placed as well as a small Hohmann retractor superiorly.   The glenoid was relatively preserved as we would expect in this fracture patient.  The remaining labrum was removed circumferentially taking great care not to disrupt the posterior capsule.    The glenoid drill guide was placed and used to drill a guide pin in the center, inferior position. The glenoid face was then reamed concentrically over the guide wire. The center hole was drilled over the guidepin in a near anatomic angle of version. Next the glenoid vault was drilled back to a depth of  85 mm.  We tapped and then placed a 25 mm size baseplate with 0 lateralization was selected with a 45 mm x 6.5 mm length central screw.  The base plate was screwed into the glenoid vault obtaining secure fixation. We next placed superior and inferior locking screws for additional fixation.  Next a 36 mm glenosphere was selected and impacted onto the baseplate. The center screw was tightened.   We then repositioned the arm to give access to the humeral shaft fragment.  Drill holes were placed and fiberwire sutures in the shaft for vertical fixation of the tuberosities.  We broached with the Revive stem implants  starting with a size 9 reamer and reaming up to 13 which obtained an appropriate fit.  The proximal body was sized separately and attached to trial and achieve a stable articulation.    We trialed with multiple size tray and polyethylene options and selected a 0 high which provided good stability and range of motion without excess soft tissue tension. The offset was dialed in to match the normal anatomy. The shoulder was trialed.  There was good ROM  in all planes and the shoulder was stable with no inferior translation.   We then mobilized her tuberosities again and placed the anterior deep limbs of the 4 #5 fiber wires around the stem.  1 of these was tied down fixing the greater tuberosity in place after bone graft harvest from the humeral head component was placed underneath.  A +0 high offset tray was selected and impacted onto the stem.   A 36+6 polyethylene liner was impacted onto the stem.  The joint was reduced and thoroughly irrigated with pulsatile lavage. The remaining sutures were then placed through the subscapularis and the bone tendon junction and the tuberosities were reduced after bone graft placed beneath as autograft at the subscap.  We horizontally secured the tuberosities before placing vertical fixation with the suture that was placed into the shaft.  Tuberosities moved as a unit were happy with her overall reduction.  This was checked on fluoroscopy confirming our position.  We irrigated copiously at this point.  Hemostasis was obtained. The deltopectoral interval was reapproximated with #1 Ethibond. The subcutaneous tissues were closed with 3-0 Vicryl and the skin was closed with running monocryl.     The wounds were cleaned and dried and an Aquacel dressing was placed. The drapes  taken down. The arm was placed into sling with abduction pillow. Patient was awakened, extubated, and transferred to the recovery room in stable condition. There were no intraoperative complications. The sponge, needle, and attention counts were correct at the end of the case.   Joya Gaskins, OPA-C, present and scrubbed throughout the case, critical for completion in a timely fashion, and for retraction, instrumentation, closure.

## 2018-04-08 ENCOUNTER — Other Ambulatory Visit: Payer: Self-pay

## 2018-04-08 MED ORDER — ONDANSETRON HCL 4 MG PO TABS: 4.0000 mg | ORAL_TABLET | Freq: Three times a day (TID) | ORAL | 1 refills | Status: AC | PRN

## 2018-04-08 MED ORDER — OXYCODONE HCL 5 MG PO TABS: ORAL_TABLET | ORAL | 0 refills | Status: AC

## 2018-04-08 MED ORDER — PROMETHAZINE HCL 50 MG PO TABS: 50.0000 mg | ORAL_TABLET | Freq: Four times a day (QID) | ORAL | 0 refills | Status: DC | PRN

## 2018-04-08 MED ORDER — ORAL CARE MOUTH RINSE
15.0000 mL | Freq: Two times a day (BID) | OROMUCOSAL | Status: DC
  Administered 2018-04-08: 15 mL via OROMUCOSAL

## 2018-04-08 MED ORDER — ACETAMINOPHEN 500 MG PO TABS: 1000.0000 mg | ORAL_TABLET | Freq: Three times a day (TID) | ORAL | 0 refills | Status: AC

## 2018-04-08 NOTE — Evaluation (Signed)
Occupational Therapy Evaluation Patient Details Name: Julie Bean MRN: [id number] DOB: 1950/11/18 Today's Date: 04/08/2018    History of Present Illness Pt is a 67 y/o female who sustained L proximal humerus fracture due to fall down stairs. Pt is now s/p reverse L TSA. PMHx includes DM, HTN   Clinical Impression   This 67 y/o female presents with the above. At baseline pt is independent with ADLs and functional mobility, was most recently receiving supervision/assist from sisters for ADLs and functional transfers given LUE functional limitations. Pt currently requiring modA for UB ADLs, min-modA for LB and toileting ADLs. Educated both pt and pt's sister regarding shoulder precautions, sling management, safety and compensatory strategies for completing ADLs and functional transfers while maintaining precautions with both pt/pt's sister verbalizing understanding. Pt plans to return home with available assist from her siblings for ADLs PRN. Questions answered throughout. Feel pt is safe to return home from OT standpoint once medically ready given available family assist and follow up as recommended per MD. No further acute OT needs identified at this time. Will sign off.     Follow Up Recommendations  Follow surgeon's recommendation for DC plan and follow-up therapies;Supervision/Assistance - 24 hour    Equipment Recommendations  None recommended by OT           Precautions / Restrictions Precautions Precautions: Shoulder Type of Shoulder Precautions: No ROM to operative shoulder; okay for AROM to e/w/h; NWB operative extremity  Shoulder Interventions: Shoulder sling/immobilizer;At all times;Off for dressing/bathing/exercises Precaution Booklet Issued: Yes (comment) Precaution Comments: issued and reviewed with pt/pt's sister  Required Braces or Orthoses: Sling Restrictions Weight Bearing Restrictions: Yes LUE Weight Bearing: Non weight bearing      Mobility Bed Mobility Overal  bed mobility: Needs Assistance Bed Mobility: Supine to Sit     Supine to sit: Supervision     General bed mobility comments: for general safety; pt able to bring LEs onto EOB without assist   Transfers Overall transfer level: Needs assistance Equipment used: None Transfers: Sit to/from [facility] Sit to Stand: Min guard Stand pivot transfers: Min guard       General transfer comment: minguard for safety, no physical assist required     Balance Overall balance assessment: Mild deficits observed, not formally tested                                         ADL either performed or assessed with clinical judgement   ADL Overall ADL's : Needs assistance/impaired Eating/Feeding: Independent;Sitting   Grooming: Set up;Sitting   Upper Body Bathing: Minimal assistance;Sitting   Lower Body Bathing: Minimal assistance;Sit to/from stand   Upper Body Dressing : Moderate assistance;Sitting Upper Body Dressing Details (indicate cue type and reason): reviewed strategies for UB dressing and sling management with pt/pt's sister  Lower Body Dressing: Minimal assistance;Moderate assistance;Sit to/from stand Lower Body Dressing Details (indicate cue type and reason): pt reports increased difficulty with advancing and pulling down underwear over hips Toilet Transfer: Min guard;Stand-pivot;BSC   Toileting- Clothing Manipulation and Hygiene: Moderate assistance;Sit to/from stand Toileting - Clothing Manipulation Details (indicate cue type and reason): assist for gown management and peri-care after voiding bladder    Tub/Shower Transfer Details (indicate cue type and reason): educated to have continued superivsion for transfers and during task completion  Functional mobility during ADLs: Min guard General ADL Comments: reviewed shoulder precautions, safety and  compensatory strategies for completing ADLs while maintaining precautions with pt/pt's sister  verbalizing understanding throughout; pt slow and guarded with room level ambulation, though no over LOB noted      Vision         Perception     Praxis      Pertinent Vitals/Pain Pain Assessment: Faces Faces Pain Scale: Hurts little more Pain Location: L UE  Pain Descriptors / Indicators: Guarding;Sore Pain Intervention(s): Monitored during session;Repositioned;Ice applied     Hand Dominance Right   Extremity/Trunk Assessment Upper Extremity Assessment Upper Extremity Assessment: LUE deficits/detail LUE Deficits / Details: s/p reverse L TSA, pt's UE is bruised and edemous  LUE: Unable to fully assess due to immobilization   Lower Extremity Assessment Lower Extremity Assessment: Overall WFL for tasks assessed       Communication Communication Communication: No difficulties   Cognition Arousal/Alertness: Awake/alert Behavior During Therapy: WFL for tasks assessed/performed Overall Cognitive Status: Within Functional Limits for tasks assessed                                     General Comments       Exercises Shoulder Exercises Elbow Flexion: AROM;10 reps;Left;Seated Elbow Extension: AROM;10 reps;Left;Seated Wrist Flexion: AROM;10 reps;Left;Seated Wrist Extension: AROM;10 reps;Left;Seated Digit Composite Flexion: AROM;10 reps;Seated;Left Composite Extension: AROM;10 reps;Left;Seated Neck Flexion: AROM;Seated Neck Extension: AROM;Seated Neck Lateral Flexion - Right: AROM;Seated Neck Lateral Flexion - Left: AROM;Seated Hand Exercises Forearm Supination: AROM;10 reps;Left;Seated Forearm Pronation: 10 reps;Left;AROM;Seated   Shoulder Instructions Shoulder Instructions Donning/doffing shirt without moving shoulder: Moderate assistance;Caregiver independent with task;Patient able to independently direct caregiver Method for sponge bathing under operated UE: Minimal assistance;Caregiver independent with task;Patient able to independently direct  caregiver Donning/doffing sling/immobilizer: Moderate assistance;Caregiver independent with task;Patient able to independently direct caregiver Correct positioning of sling/immobilizer: Moderate assistance;Caregiver independent with task;Patient able to independently direct caregiver ROM for elbow, wrist and digits of operated UE: Independent Sling wearing schedule (on at all times/off for ADL's): Independent Proper positioning of operated UE when showering: Independent Positioning of UE while sleeping: Minimal assistance;Caregiver independent with task;Patient able to independently direct caregiver    Home Living Family/patient expects to be discharged to:: Private residence Living Arrangements: Other relatives(sister ) Available Help at Discharge: Family;Available 24 hours/day Type of Home: House Home Access: Stairs to enter [employer] of Steps: 2-3 Entrance Stairs-Rails: Left Home Layout: One level     Bathroom Shower/Tub: Teacher, early years/pre: Standard     Home Equipment: Cane - single point          Prior Functioning/Environment Level of Independence: Independent        Comments: pt typically independent with ADLs and mobility; prior to pt's surgery (since fall) pt has been receiving assist for ADLs, for tub transfer and for supervision with stair navigation         OT Problem List: Decreased strength;Decreased range of motion;Impaired UE functional use;Pain;Decreased knowledge of precautions      OT Treatment/Interventions:      OT Goals(Current goals can be found in the care plan section) Acute Rehab OT Goals Patient Stated Goal: regain independence  OT Goal Formulation: With patient Time For Goal Achievement: 04/22/18 Potential to Achieve Goals: Good  OT Frequency:     Barriers to D/C:            Co-evaluation  AM-PAC PT "6 Clicks" Daily Activity     Outcome Measure Help from another person eating meals?:  None Help from another person taking care of personal grooming?: A Little Help from another person toileting, which includes using toliet, bedpan, or urinal?: A Little Help from another person bathing (including washing, rinsing, drying)?: A Little Help from another person to put on and taking off regular upper body clothing?: A Lot Help from another person to put on and taking off regular lower body clothing?: A Little 6 Click Score: 18   End of Session Equipment Utilized During Treatment: Other (comment)(sling ) Nurse Communication: Mobility status  Activity Tolerance: Patient tolerated treatment well Patient left: in bed;with call bell/phone within reach  OT Visit Diagnosis: Other abnormalities of gait and mobility (R26.89);Pain Pain - Right/Left: Left Pain - part of body: Shoulder                Time: 0677-0340 OT Time Calculation (min): 37 min Charges:  OT General Charges $OT Visit: 1 Visit OT Evaluation $OT Eval Low Complexity: 1 Low OT Treatments $Self Care/Home Management : 8-22 mins  Lou Cal, OT Pager [phone] 04/08/2018   Raymondo Band 04/08/2018, 1:00 PM

## 2018-04-08 NOTE — Discharge Summary (Signed)
Patient ID: Julie Bean MRN: [id number] DOB/AGE: 1951/03/26 67 y.o.  Admit date: 04/07/2018 Discharge date: 04/08/2018  Admission Diagnoses:L proximal humerus fracture  Discharge Diagnoses:  Active Problems:   Closed fracture of left proximal humerus   Past Medical History:  Diagnosis Date  . Arthritis    knees, shoulder, neck and hip  . Diabetes mellitus without complication ([place])    Type II  . GERD (gastroesophageal reflux disease)   . Headache, hemicrania continua   . Hypertension   . Hypothyroidism   . Macular degeneration   . PONV (postoperative nausea and vomiting)   . Proximal humerus fracture    Left     Procedures Performed: L reverse total shoulder arthroplasty  Discharged Condition: good  Hospital Course: Patient brought in as an outpatient for surgery.  Tolerated procedure well.  Was kept for monitoring overnight for pain control and medical monitoring postop and was found to be stable for DC home the morning after surgery.  Patient was instructed on specific activity restrictions and all questions were answered.   Consults: None  Significant Diagnostic Studies: No additional pertinent studies  Treatments: Surgery  Discharge Exam:  Dressing CDI and sling well fitting,  full and painless ROM throughout hand with DPC of 0. + Motor in  AIN, PIN, Ulnar distributions. Axillary nerve sensation preserved but somewhat altered with block still in place.  Sensation intact in medial, radial, and ulnar distributions. Well perfused digits.     Disposition: Discharge disposition: 01-Home or Self Care       Discharge Instructions    Call MD for:  persistant nausea and vomiting   Complete by:  As directed    Call MD for:  redness, tenderness, or signs of infection (pain, swelling, redness, odor or green/yellow discharge around incision site)   Complete by:  As directed    Call MD for:  severe uncontrolled pain   Complete by:  As directed    Diet - low  sodium heart healthy   Complete by:  As directed    Discharge instructions   Complete by:  As directed    Ophelia Charter MD, MPH [place]. [address], [address] [phone] (tel)   [phone] (fax)   Francis may leave the operative dressing in place until your follow-up appointment. KEEP THE INCISIONS CLEAN AND DRY. Use the Cryocuff, GameReady or Ice as often as possible for the first 3-4 days, then as needed for pain relief.  You may shower on Post-Op Day #2. The dressing is water resistant but do not scrub it as it may start to peel up.  You may remove the sling for showering, but keep a water resistant pillow under the arm to keep both the elbow and shoulder away from the body (mimicking the abduction sling). Gently pat the area dry. Do not soak the shoulder in water. Do not go swimming in the pool or ocean until your sutures are removed.  EXERCISES Wear the sling at all times except when doing your exercises. You may remove the sling for showering, but keep the arm across the chest or in a secondary sling.   Accidental/Purposeful External Rotation and shoulder flexion (reaching behind you) is to be avoided at all costs for the first month. Please perform the exercises:   Elbow / Hand / Wrist  Range of Motion Exercises POST-OP A multi-modal approach will be used to treat your pain.  Oxycodone - This is a strong narcotic, to be used only on an "as needed" basis for pain. Meloxicam- An anti-inflammatory medication Acetaminophen - A non-narcotic pain medicine.  Use 1000mg  three times a day for the first 14 days after surgery If you have any adverse effects with the medications, please call our office.  FOLLOW-UP If you develop a Fever (>101.5), Redness or Drainage from the surgical incision site, please call our office to arrange for an evaluation. Please call the office to schedule a  follow-up appointment for a wound check, 7-10 days post-operatively.    IF YOU HAVE ANY QUESTIONS, PLEASE FEEL FREE TO CALL OUR OFFICE.   HELPFUL INFORMATION  Your arm will be in a sling following surgery. You will be in this sling for the next 3-4 weeks.  I will let you know the exact duration at your follow-up visit.  You may be more comfortable sleeping in a semi-seated position the first few nights following surgery.  Keep a pillow propped under the elbow and forearm for comfort.  If you have a recliner type of chair it might be beneficial.  If not that is fine too, but it would be helpful to sleep propped up with pillows behind your operated shoulder as well under your elbow and forearm.  This will reduce pulling on the suture lines.  We suggest you use the pain medication the first night prior to going to bed, in order to ease any pain when the anesthesia wears off. You should avoid taking pain medications on an empty stomach as it will make you nauseous.  Do not drink alcoholic beverages or take illicit drugs when taking pain medications.  In most states it is against the law to drive while your arm is in a sling. And certainly against the law to drive while taking narcotics.  You may return to work/school in the next couple of days when you feel up to it. Desk work and typing in the sling is fine.  When dressing, put your operative arm in the sleeve first.  When getting undressed, take your operative arm out last.  Loose fitting, button-down shirts are recommended.  Pain medication may make you constipated.  Below are a few solutions to try in this order: Decrease the amount of pain medication if you aren't having pain. Drink lots of decaffeinated fluids. Drink prune juice and/or each dried prunes  If the first 3 don't work start with additional solutions Take Colace - an over-the-counter stool softener Take Senokot - an over-the-counter laxative Take Miralax - a stronger  over-the-counter laxative   Increase activity slowly   Complete by:  As directed      Allergies as of 04/08/2018   No Known Allergies     Medication List    STOP taking these medications   oxyCODONE-acetaminophen 5-325 MG tablet Commonly known as:  PERCOCET/ROXICET     TAKE these medications   acetaminophen 500 MG tablet Commonly known as:  TYLENOL Take 1,000 mg by mouth every 8 (eight) hours as needed for moderate pain. What changed:  Another medication with the same name was added. Make sure you understand how and when to take each.   acetaminophen 500 MG tablet Commonly known as:  TYLENOL Take 2 tablets (1,000 mg total) by mouth every 8 (eight) hours for 14 days. What changed:  You were already taking a medication with the same name, and this prescription was added. Make sure you understand how and when to take each.  divalproex 500 MG DR tablet Commonly known as:  DEPAKOTE Take 500 mg by mouth 2 (two) times daily.   hydrochlorothiazide 12.5 MG tablet Commonly known as:  HYDRODIURIL Take 12.5 mg by mouth daily.   indomethacin 50 MG capsule Commonly known as:  INDOCIN Take 50 mg by mouth 2 (two) times daily with a meal.   levothyroxine 150 MCG tablet Commonly known as:  SYNTHROID, LEVOTHROID Take 150 mcg by mouth daily before breakfast.   metFORMIN 500 MG tablet Commonly known as:  GLUCOPHAGE Take 500 mg by mouth 2 (two) times daily with a meal.   omeprazole 40 MG capsule Commonly known as:  PRILOSEC Take 40 mg by mouth daily.   ondansetron 4 MG tablet Commonly known as:  ZOFRAN Take 1 tablet (4 mg total) by mouth every 6 (six) hours. What changed:  Another medication with the same name was added. Make sure you understand how and when to take each.   ondansetron 4 MG tablet Commonly known as:  ZOFRAN Take 1 tablet (4 mg total) by mouth every 8 (eight) hours as needed for up to 7 days for nausea or vomiting. What changed:  You were already taking a  medication with the same name, and this prescription was added. Make sure you understand how and when to take each.   oxyCODONE 5 MG immediate release tablet Commonly known as:  Oxy IR/ROXICODONE Take 1-2 pills every 6 hrs as needed for pain   pravastatin 40 MG tablet Commonly known as:  PRAVACHOL Take 40 mg by mouth daily.   PRESERVISION AREDS 2+MULTI VIT PO Take 1 tablet by mouth 2 (two) times daily.   promethazine 50 MG tablet Commonly known as:  PHENERGAN Take 1 tablet (50 mg total) by mouth every 6 (six) hours as needed for nausea or vomiting.   telmisartan 40 MG tablet Commonly known as:  MICARDIS Take 40 mg by mouth daily.

## 2018-04-08 NOTE — Progress Notes (Signed)
Pt discharged to home in stable condition, all discharge instructions and Rx's reviewed with pt and sister.  Pt and belongings transported via wheelchair with sister and transporters X 1 at chairside.  [id number]

## 2018-04-08 NOTE — Plan of Care (Signed)
Patient with pain well controlled. No issues thus far. Will continue to monitor.

## 2018-04-12 ENCOUNTER — Encounter (HOSPITAL_COMMUNITY): Payer: Self-pay | Admitting: Orthopaedic Surgery

## 2018-04-13 DIAGNOSIS — S42202D Unspecified fracture of upper end of left humerus, subsequent encounter for fracture with routine healing: Secondary | ICD-10-CM | POA: Diagnosis not present

## 2018-04-23 ENCOUNTER — Encounter (HOSPITAL_COMMUNITY): Payer: Self-pay

## 2018-04-23 ENCOUNTER — Ambulatory Visit (HOSPITAL_COMMUNITY): Payer: Medicare Other | Attending: Orthopaedic Surgery

## 2018-04-23 ENCOUNTER — Other Ambulatory Visit: Payer: Self-pay

## 2018-04-23 DIAGNOSIS — M25612 Stiffness of left shoulder, not elsewhere classified: Secondary | ICD-10-CM | POA: Insufficient documentation

## 2018-04-23 DIAGNOSIS — M25512 Pain in left shoulder: Secondary | ICD-10-CM | POA: Insufficient documentation

## 2018-04-23 DIAGNOSIS — R29898 Other symptoms and signs involving the musculoskeletal system: Secondary | ICD-10-CM | POA: Insufficient documentation

## 2018-04-23 NOTE — Patient Instructions (Signed)
TOWEL SLIDES COMPLETE FOR 1-3 MINUTES, 3-5 TIMES PER DAY  SHOULDER: Flexion On Table   Place hands on table, elbows straight. Move hips away from body. Press hands down into table. Hold ___ seconds. ___ reps per set, ___ sets per day, ___ days per week  Abduction (Passive)   With arm out to side, resting on table, lower head toward arm, keeping trunk away from table. Hold ____ seconds. Repeat ____ times. Do ____ sessions per day.   AROM: Wrist Extension   With right palm down, bend wrist up. Repeat 10____ times per set. Do ____ sets per session. Do __3__ sessions per day.  Copyright  VHI. All rights reserved.   AROM: Wrist Flexion   With right palm up, bend wrist up. Repeat ___10_ times per set. Do ____ sets per session. Do __3__ sessions per day.  Copyright  VHI. All rights reserved.   AROM: Forearm Pronation / Supination   With right arm in handshake position, slowly rotate palm down until stretch is felt. Relax. Then rotate palm up until stretch is felt. Repeat __10__ times per set. Do ____ sets per session. Do __3__ sessions per day.  Copyright  VHI. All rights reserved.   AFlexion (Passive)   Use other hand to bend elbow, with thumb toward same shoulder. Do NOT force this motion. Hold ____ seconds. Repeat ____ times. Do ____ sessions per day.
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
## 2018-04-28 ENCOUNTER — Encounter (HOSPITAL_COMMUNITY): Payer: Self-pay | Admitting: Occupational Therapy

## 2018-04-28 ENCOUNTER — Ambulatory Visit (HOSPITAL_COMMUNITY): Payer: Medicare Other | Admitting: Occupational Therapy

## 2018-04-28 DIAGNOSIS — M25612 Stiffness of left shoulder, not elsewhere classified: Secondary | ICD-10-CM

## 2018-04-28 DIAGNOSIS — R29898 Other symptoms and signs involving the musculoskeletal system: Secondary | ICD-10-CM | POA: Diagnosis not present

## 2018-04-28 DIAGNOSIS — M25512 Pain in left shoulder: Secondary | ICD-10-CM | POA: Diagnosis not present
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
## 2018-04-29 ENCOUNTER — Encounter

## 2018-04-30 ENCOUNTER — Ambulatory Visit (HOSPITAL_COMMUNITY): Payer: Medicare Other | Admitting: Occupational Therapy

## 2018-04-30 ENCOUNTER — Encounter (HOSPITAL_COMMUNITY): Payer: Self-pay | Admitting: Occupational Therapy

## 2018-04-30 DIAGNOSIS — M25512 Pain in left shoulder: Secondary | ICD-10-CM

## 2018-04-30 DIAGNOSIS — M25612 Stiffness of left shoulder, not elsewhere classified: Secondary | ICD-10-CM | POA: Diagnosis not present

## 2018-04-30 DIAGNOSIS — R29898 Other symptoms and signs involving the musculoskeletal system: Secondary | ICD-10-CM
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
## 2018-05-04 ENCOUNTER — Encounter (HOSPITAL_COMMUNITY): Payer: Self-pay

## 2018-05-04 ENCOUNTER — Ambulatory Visit (HOSPITAL_COMMUNITY): Payer: Medicare Other | Attending: Orthopaedic Surgery

## 2018-05-04 DIAGNOSIS — R29898 Other symptoms and signs involving the musculoskeletal system: Secondary | ICD-10-CM | POA: Diagnosis not present

## 2018-05-04 DIAGNOSIS — M25612 Stiffness of left shoulder, not elsewhere classified: Secondary | ICD-10-CM

## 2018-05-04 DIAGNOSIS — S42202D Unspecified fracture of upper end of left humerus, subsequent encounter for fracture with routine healing: Secondary | ICD-10-CM | POA: Diagnosis not present

## 2018-05-04 DIAGNOSIS — M25512 Pain in left shoulder: Secondary | ICD-10-CM
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
## 2018-05-06 ENCOUNTER — Ambulatory Visit (HOSPITAL_COMMUNITY): Payer: Medicare Other

## 2018-05-06 ENCOUNTER — Encounter (HOSPITAL_COMMUNITY): Payer: Self-pay

## 2018-05-06 DIAGNOSIS — M25512 Pain in left shoulder: Secondary | ICD-10-CM | POA: Diagnosis not present

## 2018-05-06 DIAGNOSIS — R29898 Other symptoms and signs involving the musculoskeletal system: Secondary | ICD-10-CM | POA: Diagnosis not present

## 2018-05-06 DIAGNOSIS — M25612 Stiffness of left shoulder, not elsewhere classified: Secondary | ICD-10-CM | POA: Diagnosis not present
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
## 2018-05-07 DIAGNOSIS — G4451 Hemicrania continua: Secondary | ICD-10-CM | POA: Diagnosis not present

## 2018-05-07 DIAGNOSIS — H3530 Unspecified macular degeneration: Secondary | ICD-10-CM | POA: Diagnosis not present

## 2018-05-07 DIAGNOSIS — E119 Type 2 diabetes mellitus without complications: Secondary | ICD-10-CM | POA: Diagnosis not present

## 2018-05-07 DIAGNOSIS — I10 Essential (primary) hypertension: Secondary | ICD-10-CM | POA: Diagnosis not present

## 2018-05-07 DIAGNOSIS — E782 Mixed hyperlipidemia: Secondary | ICD-10-CM | POA: Diagnosis not present

## 2018-05-07 DIAGNOSIS — K219 Gastro-esophageal reflux disease without esophagitis: Secondary | ICD-10-CM | POA: Diagnosis not present

## 2018-05-07 DIAGNOSIS — Z0001 Encounter for general adult medical examination with abnormal findings: Secondary | ICD-10-CM | POA: Diagnosis not present

## 2018-05-07 DIAGNOSIS — E039 Hypothyroidism, unspecified: Secondary | ICD-10-CM | POA: Diagnosis not present

## 2018-05-07 DIAGNOSIS — M79605 Pain in left leg: Secondary | ICD-10-CM | POA: Diagnosis not present

## 2018-05-11 DIAGNOSIS — Z6841 Body Mass Index (BMI) 40.0 and over, adult: Secondary | ICD-10-CM | POA: Diagnosis not present

## 2018-05-11 DIAGNOSIS — I10 Essential (primary) hypertension: Secondary | ICD-10-CM | POA: Diagnosis not present

## 2018-05-11 DIAGNOSIS — G4451 Hemicrania continua: Secondary | ICD-10-CM | POA: Diagnosis not present

## 2018-05-11 DIAGNOSIS — E782 Mixed hyperlipidemia: Secondary | ICD-10-CM | POA: Diagnosis not present

## 2018-05-11 DIAGNOSIS — K219 Gastro-esophageal reflux disease without esophagitis: Secondary | ICD-10-CM | POA: Diagnosis not present

## 2018-05-11 DIAGNOSIS — M25512 Pain in left shoulder: Secondary | ICD-10-CM | POA: Diagnosis not present

## 2018-05-11 DIAGNOSIS — D508 Other iron deficiency anemias: Secondary | ICD-10-CM | POA: Diagnosis not present

## 2018-05-11 DIAGNOSIS — E119 Type 2 diabetes mellitus without complications: Secondary | ICD-10-CM | POA: Diagnosis not present

## 2018-05-11 DIAGNOSIS — E039 Hypothyroidism, unspecified: Secondary | ICD-10-CM | POA: Diagnosis not present

## 2018-05-11 DIAGNOSIS — H3530 Unspecified macular degeneration: Secondary | ICD-10-CM | POA: Diagnosis not present

## 2018-05-12 ENCOUNTER — Ambulatory Visit (HOSPITAL_COMMUNITY): Payer: Medicare Other

## 2018-05-12 ENCOUNTER — Encounter (HOSPITAL_COMMUNITY): Payer: Self-pay

## 2018-05-12 DIAGNOSIS — R29898 Other symptoms and signs involving the musculoskeletal system: Secondary | ICD-10-CM

## 2018-05-12 DIAGNOSIS — M25512 Pain in left shoulder: Secondary | ICD-10-CM | POA: Diagnosis not present

## 2018-05-12 DIAGNOSIS — M25612 Stiffness of left shoulder, not elsewhere classified: Secondary | ICD-10-CM
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
## 2018-05-14 ENCOUNTER — Encounter (HOSPITAL_COMMUNITY): Payer: Self-pay

## 2018-05-14 ENCOUNTER — Ambulatory Visit (HOSPITAL_COMMUNITY): Payer: Medicare Other

## 2018-05-14 DIAGNOSIS — M25512 Pain in left shoulder: Secondary | ICD-10-CM | POA: Diagnosis not present

## 2018-05-14 DIAGNOSIS — R29898 Other symptoms and signs involving the musculoskeletal system: Secondary | ICD-10-CM

## 2018-05-14 DIAGNOSIS — M25612 Stiffness of left shoulder, not elsewhere classified: Secondary | ICD-10-CM | POA: Diagnosis not present
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
## 2018-05-14 NOTE — Patient Instructions (Addendum)
Home Exercises Program Theraputty Exercises  Do the following exercises 3-5 times a day using your affected hand.  PUTTY GRIP  Place putty in your hand and squeeze it firmly and slowly. Reshape it and repeat. 15-20 times.   PUTTY [place]  Roll up some putty to create a small tubular section. Next, pinch the putty and repeat down the section.   15-20 times    Lateral Pinch Resistance Putty Exercise   Squeeze therpautty between thumb and side of index finger. The positon of the hand should be in neutral as if you were turning a key. Squeeze until putty is thin between thumb and index finger.   15-20 times

## 2018-05-18 ENCOUNTER — Encounter (HOSPITAL_COMMUNITY): Payer: Self-pay

## 2018-05-18 ENCOUNTER — Ambulatory Visit (HOSPITAL_COMMUNITY): Payer: Medicare Other

## 2018-05-18 DIAGNOSIS — M25612 Stiffness of left shoulder, not elsewhere classified: Secondary | ICD-10-CM

## 2018-05-18 DIAGNOSIS — R29898 Other symptoms and signs involving the musculoskeletal system: Secondary | ICD-10-CM | POA: Diagnosis not present

## 2018-05-18 DIAGNOSIS — M25512 Pain in left shoulder: Secondary | ICD-10-CM | POA: Diagnosis not present
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
## 2018-05-21 ENCOUNTER — Ambulatory Visit (HOSPITAL_COMMUNITY): Payer: Medicare Other

## 2018-05-21 DIAGNOSIS — M25612 Stiffness of left shoulder, not elsewhere classified: Secondary | ICD-10-CM

## 2018-05-21 DIAGNOSIS — M25512 Pain in left shoulder: Secondary | ICD-10-CM

## 2018-05-21 DIAGNOSIS — R29898 Other symptoms and signs involving the musculoskeletal system: Secondary | ICD-10-CM
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
## 2018-05-25 ENCOUNTER — Encounter (HOSPITAL_COMMUNITY): Payer: Self-pay

## 2018-05-25 ENCOUNTER — Ambulatory Visit (HOSPITAL_COMMUNITY): Payer: Medicare Other

## 2018-05-25 DIAGNOSIS — M25612 Stiffness of left shoulder, not elsewhere classified: Secondary | ICD-10-CM | POA: Diagnosis not present

## 2018-05-25 DIAGNOSIS — R29898 Other symptoms and signs involving the musculoskeletal system: Secondary | ICD-10-CM | POA: Diagnosis not present

## 2018-05-25 DIAGNOSIS — M25512 Pain in left shoulder: Secondary | ICD-10-CM
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
## 2018-05-27 ENCOUNTER — Encounter (HOSPITAL_COMMUNITY): Payer: Self-pay

## 2018-05-27 ENCOUNTER — Ambulatory Visit (HOSPITAL_COMMUNITY): Payer: Medicare Other

## 2018-05-27 DIAGNOSIS — M25612 Stiffness of left shoulder, not elsewhere classified: Secondary | ICD-10-CM | POA: Diagnosis not present

## 2018-05-27 DIAGNOSIS — R29898 Other symptoms and signs involving the musculoskeletal system: Secondary | ICD-10-CM | POA: Diagnosis not present

## 2018-05-27 DIAGNOSIS — M25512 Pain in left shoulder: Secondary | ICD-10-CM | POA: Diagnosis not present
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
## 2018-05-27 NOTE — Patient Instructions (Signed)
Perform each exercise ____10____ reps. 2-3x days. -Laying down  Protraction -  Start by holding a wand or cane at chest height.  Next, slowly push the wand outwards in front of your body so that your elbows become fully straightened. Then, return to the original position.     Shoulder FLEXION  - PALMS down  In the standing position, hold a wand/cane with both arms, palms down on both sides. Raise up the wand/cane allowing your unaffected arm to perform most of the effort. Your affected arm should be partially relaxed.      Internal/External ROTATION   In the standing position, hold a wand/cane with both hands keeping your elbows bent. Move your arms and wand/cane to one side.  Your affected arm should be partially relaxed while your unaffected arm performs most of the effort.

## 2018-06-01 ENCOUNTER — Encounter (HOSPITAL_COMMUNITY): Payer: Self-pay

## 2018-06-01 ENCOUNTER — Ambulatory Visit (HOSPITAL_COMMUNITY): Payer: Medicare Other | Attending: Orthopaedic Surgery

## 2018-06-01 DIAGNOSIS — R29898 Other symptoms and signs involving the musculoskeletal system: Secondary | ICD-10-CM | POA: Insufficient documentation

## 2018-06-01 DIAGNOSIS — M25512 Pain in left shoulder: Secondary | ICD-10-CM | POA: Insufficient documentation

## 2018-06-01 DIAGNOSIS — M25612 Stiffness of left shoulder, not elsewhere classified: Secondary | ICD-10-CM | POA: Insufficient documentation
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
## 2018-06-03 ENCOUNTER — Ambulatory Visit (HOSPITAL_COMMUNITY): Payer: Medicare Other

## 2018-06-03 ENCOUNTER — Encounter (HOSPITAL_COMMUNITY): Payer: Self-pay

## 2018-06-03 DIAGNOSIS — M25612 Stiffness of left shoulder, not elsewhere classified: Secondary | ICD-10-CM

## 2018-06-03 DIAGNOSIS — M25512 Pain in left shoulder: Secondary | ICD-10-CM

## 2018-06-03 DIAGNOSIS — R29898 Other symptoms and signs involving the musculoskeletal system: Secondary | ICD-10-CM
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
## 2018-06-08 ENCOUNTER — Encounter (HOSPITAL_COMMUNITY): Payer: Self-pay

## 2018-06-08 ENCOUNTER — Ambulatory Visit (HOSPITAL_COMMUNITY): Payer: Medicare Other

## 2018-06-08 DIAGNOSIS — M25512 Pain in left shoulder: Secondary | ICD-10-CM | POA: Diagnosis not present

## 2018-06-08 DIAGNOSIS — R29898 Other symptoms and signs involving the musculoskeletal system: Secondary | ICD-10-CM | POA: Diagnosis not present

## 2018-06-08 DIAGNOSIS — M25612 Stiffness of left shoulder, not elsewhere classified: Secondary | ICD-10-CM
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
## 2018-06-10 ENCOUNTER — Ambulatory Visit (HOSPITAL_COMMUNITY): Payer: Medicare Other

## 2018-06-10 ENCOUNTER — Encounter (HOSPITAL_COMMUNITY): Payer: Self-pay

## 2018-06-10 DIAGNOSIS — M25612 Stiffness of left shoulder, not elsewhere classified: Secondary | ICD-10-CM | POA: Diagnosis not present

## 2018-06-10 DIAGNOSIS — M25512 Pain in left shoulder: Secondary | ICD-10-CM

## 2018-06-10 DIAGNOSIS — R29898 Other symptoms and signs involving the musculoskeletal system: Secondary | ICD-10-CM

## 2018-06-10 NOTE — Patient Instructions (Signed)
Perform each exercise ___10-12_____ reps. 2-3x days.   Protraction - STANDING  Start by holding a wand or cane at chest height.  Next, slowly push the wand outwards in front of your body so that your elbows become fully straightened. Then, return to the original position.     Shoulder FLEXION - STANDING - PALMS UP  In the standing position, hold a wand/cane with both arms, palms up on both sides. Raise up the wand/cane allowing your unaffected arm to perform most of the effort. Your affected arm should be partially relaxed.      Internal/External ROTATION - STANDING  In the standing position, hold a wand/cane with both hands keeping your elbows bent. Move your arms and wand/cane to one side.  Your affected arm should be partially relaxed while your unaffected arm performs most of the effort.       Shoulder ABDUCTION - STANDING  While holding a wand/cane palm face up on the injured side and palm face down on the uninjured side, slowly raise up your injured arm to the side.                     Horizontal Abduction/Adduction      Straight arms holding cane at shoulder height, bring cane to right, center, left. Repeat starting to left.   Copyright  VHI. All rights reserved.       
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
## 2018-06-22 ENCOUNTER — Encounter (HOSPITAL_COMMUNITY): Payer: Self-pay

## 2018-06-22 ENCOUNTER — Ambulatory Visit (HOSPITAL_COMMUNITY): Payer: Medicare Other

## 2018-06-22 DIAGNOSIS — M25512 Pain in left shoulder: Secondary | ICD-10-CM

## 2018-06-22 DIAGNOSIS — M25612 Stiffness of left shoulder, not elsewhere classified: Secondary | ICD-10-CM

## 2018-06-22 DIAGNOSIS — R29898 Other symptoms and signs involving the musculoskeletal system: Secondary | ICD-10-CM

## 2018-06-22 DIAGNOSIS — Z23 Encounter for immunization: Secondary | ICD-10-CM | POA: Diagnosis not present

## 2018-06-23 ENCOUNTER — Encounter (HOSPITAL_COMMUNITY): Payer: Self-pay
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
## 2018-06-24 ENCOUNTER — Ambulatory Visit (HOSPITAL_COMMUNITY): Payer: Medicare Other

## 2018-06-24 ENCOUNTER — Encounter (HOSPITAL_COMMUNITY): Payer: Self-pay

## 2018-06-24 DIAGNOSIS — R29898 Other symptoms and signs involving the musculoskeletal system: Secondary | ICD-10-CM | POA: Diagnosis not present

## 2018-06-24 DIAGNOSIS — M25612 Stiffness of left shoulder, not elsewhere classified: Secondary | ICD-10-CM | POA: Diagnosis not present

## 2018-06-24 DIAGNOSIS — M25512 Pain in left shoulder: Secondary | ICD-10-CM
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
## 2018-06-24 NOTE — Patient Instructions (Signed)
   Don't apply any weight into arm. Hold stretch for 30 seconds. Complete 2 times. Work up to a minute hold.

## 2018-06-29 ENCOUNTER — Encounter (HOSPITAL_COMMUNITY): Payer: Self-pay

## 2018-06-29 ENCOUNTER — Ambulatory Visit (HOSPITAL_COMMUNITY): Payer: Medicare Other

## 2018-06-29 DIAGNOSIS — M25612 Stiffness of left shoulder, not elsewhere classified: Secondary | ICD-10-CM

## 2018-06-29 DIAGNOSIS — R29898 Other symptoms and signs involving the musculoskeletal system: Secondary | ICD-10-CM

## 2018-06-29 DIAGNOSIS — E118 Type 2 diabetes mellitus with unspecified complications: Secondary | ICD-10-CM | POA: Diagnosis not present

## 2018-06-29 DIAGNOSIS — L851 Acquired keratosis [keratoderma] palmaris et plantaris: Secondary | ICD-10-CM | POA: Diagnosis not present

## 2018-06-29 DIAGNOSIS — M25512 Pain in left shoulder: Secondary | ICD-10-CM | POA: Diagnosis not present

## 2018-06-29 DIAGNOSIS — Q6689 Other  specified congenital deformities of feet: Secondary | ICD-10-CM | POA: Diagnosis not present
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
## 2018-06-30 DIAGNOSIS — H353112 Nonexudative age-related macular degeneration, right eye, intermediate dry stage: Secondary | ICD-10-CM | POA: Diagnosis not present

## 2018-07-01 ENCOUNTER — Encounter (HOSPITAL_COMMUNITY): Payer: Self-pay

## 2018-07-01 ENCOUNTER — Ambulatory Visit (HOSPITAL_COMMUNITY): Payer: Medicare Other

## 2018-07-01 DIAGNOSIS — M25612 Stiffness of left shoulder, not elsewhere classified: Secondary | ICD-10-CM | POA: Diagnosis not present

## 2018-07-01 DIAGNOSIS — R29898 Other symptoms and signs involving the musculoskeletal system: Secondary | ICD-10-CM

## 2018-07-01 DIAGNOSIS — M25512 Pain in left shoulder: Secondary | ICD-10-CM
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
## 2018-07-06 ENCOUNTER — Ambulatory Visit (HOSPITAL_COMMUNITY): Payer: Medicare Other | Attending: Orthopaedic Surgery

## 2018-07-06 DIAGNOSIS — M25512 Pain in left shoulder: Secondary | ICD-10-CM | POA: Diagnosis not present

## 2018-07-06 DIAGNOSIS — M25612 Stiffness of left shoulder, not elsewhere classified: Secondary | ICD-10-CM | POA: Diagnosis not present

## 2018-07-06 DIAGNOSIS — S42202D Unspecified fracture of upper end of left humerus, subsequent encounter for fracture with routine healing: Secondary | ICD-10-CM | POA: Diagnosis not present

## 2018-07-06 DIAGNOSIS — R29898 Other symptoms and signs involving the musculoskeletal system: Secondary | ICD-10-CM | POA: Diagnosis not present

## 2018-07-06 NOTE — Patient Instructions (Signed)
Shoulder Flexion Wall Stretch  Standing in front of the wall, walk your fingers up the wall until your arm is raised above your hand and you feel a slight stretch in the shoulder.  Use both hands to help slide up the wall. Hold for 15 seconds. Complete 2 times.    Seated Passive Shoulder Flexion  Place both arms up on table/desk/counter with elbows straight and thumbs up as pictured. While seated on rolling stool or office chair, scoot back away from the table, allowing your shoulder to passively move into flexion.   Go to a point where you feel a good comfortable stretch, do not stretch to the point of pain.  Stand or sit. Use chair or table/counter. Hold for 15 seconds. Complete 2 times.
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
## 2018-07-08 ENCOUNTER — Ambulatory Visit (HOSPITAL_COMMUNITY): Payer: Medicare Other

## 2018-07-08 ENCOUNTER — Encounter (HOSPITAL_COMMUNITY): Payer: Self-pay

## 2018-07-08 DIAGNOSIS — M25612 Stiffness of left shoulder, not elsewhere classified: Secondary | ICD-10-CM | POA: Diagnosis not present

## 2018-07-08 DIAGNOSIS — R29898 Other symptoms and signs involving the musculoskeletal system: Secondary | ICD-10-CM

## 2018-07-08 DIAGNOSIS — M25512 Pain in left shoulder: Secondary | ICD-10-CM
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
## 2018-07-13 ENCOUNTER — Ambulatory Visit (HOSPITAL_COMMUNITY): Payer: Medicare Other

## 2018-07-13 DIAGNOSIS — R29898 Other symptoms and signs involving the musculoskeletal system: Secondary | ICD-10-CM | POA: Diagnosis not present

## 2018-07-13 DIAGNOSIS — M25512 Pain in left shoulder: Secondary | ICD-10-CM

## 2018-07-13 DIAGNOSIS — M25612 Stiffness of left shoulder, not elsewhere classified: Secondary | ICD-10-CM | POA: Diagnosis not present
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
## 2018-07-15 ENCOUNTER — Encounter (HOSPITAL_COMMUNITY): Payer: Self-pay

## 2018-07-15 ENCOUNTER — Ambulatory Visit (HOSPITAL_COMMUNITY): Payer: Medicare Other

## 2018-07-15 DIAGNOSIS — M25512 Pain in left shoulder: Secondary | ICD-10-CM | POA: Diagnosis not present

## 2018-07-15 DIAGNOSIS — M25612 Stiffness of left shoulder, not elsewhere classified: Secondary | ICD-10-CM | POA: Diagnosis not present

## 2018-07-15 DIAGNOSIS — R29898 Other symptoms and signs involving the musculoskeletal system: Secondary | ICD-10-CM | POA: Diagnosis not present
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
## 2018-07-20 ENCOUNTER — Encounter (HOSPITAL_COMMUNITY): Payer: Self-pay

## 2018-07-20 ENCOUNTER — Ambulatory Visit (HOSPITAL_COMMUNITY): Payer: Medicare Other

## 2018-07-20 DIAGNOSIS — R29898 Other symptoms and signs involving the musculoskeletal system: Secondary | ICD-10-CM | POA: Diagnosis not present

## 2018-07-20 DIAGNOSIS — M25512 Pain in left shoulder: Secondary | ICD-10-CM

## 2018-07-20 DIAGNOSIS — M25612 Stiffness of left shoulder, not elsewhere classified: Secondary | ICD-10-CM | POA: Diagnosis not present

## 2018-07-21 ENCOUNTER — Encounter (HOSPITAL_COMMUNITY): Payer: Self-pay
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
## 2018-07-22 ENCOUNTER — Ambulatory Visit (HOSPITAL_COMMUNITY): Payer: Medicare Other

## 2018-07-22 ENCOUNTER — Encounter (HOSPITAL_COMMUNITY): Payer: Self-pay

## 2018-07-22 DIAGNOSIS — M25612 Stiffness of left shoulder, not elsewhere classified: Secondary | ICD-10-CM | POA: Diagnosis not present

## 2018-07-22 DIAGNOSIS — R29898 Other symptoms and signs involving the musculoskeletal system: Secondary | ICD-10-CM

## 2018-07-22 DIAGNOSIS — M25512 Pain in left shoulder: Secondary | ICD-10-CM | POA: Diagnosis not present

## 2018-07-22 NOTE — Patient Instructions (Signed)
Complete the following exercises 2-3 times a day.  Doorway Stretch  Place each hand opposite each other on the doorway. (You can change where you feel the stretch by moving arms higher or lower.) Step through with one foot and bend front knee until a stretch is felt and hold. Step through with the opposite foot on the next rep. Hold for __15-30___ seconds. Repeat __2__times.      Internal Rotation Across Back  Grab the end of a towel with your affected side, palm facing backwards. Grab the towel with your unaffected side and pull your affected hand across your back until you feel a stretch in the front of your shoulder. If you feel pain, pull just to the pain, do not pull through the pain. Hold. Return your affected arm to your side. Try to keep your hand/arm close to your body during the entire movement.     Hold for 15-30 seconds. Complete 2 times.        Posterior Capsule Stretch   Stand or sit, one arm across body so hand rests over opposite shoulder. Gently push on crossed elbow with other hand until stretch is felt in shoulder of crossed arm. Hold _10-15__ seconds.  Repeat _2__ times per session. Do ___ sessions per day.   Slide your arm up the wall or door frame until a stretch is felt in your shoulder . Hold for 15-30 seconds. Complete 2 times
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
## 2018-07-27 ENCOUNTER — Ambulatory Visit (HOSPITAL_COMMUNITY): Payer: Medicare Other

## 2018-07-27 ENCOUNTER — Encounter (HOSPITAL_COMMUNITY): Payer: Self-pay

## 2018-07-27 DIAGNOSIS — R29898 Other symptoms and signs involving the musculoskeletal system: Secondary | ICD-10-CM

## 2018-07-27 DIAGNOSIS — M25612 Stiffness of left shoulder, not elsewhere classified: Secondary | ICD-10-CM | POA: Diagnosis not present

## 2018-07-27 DIAGNOSIS — M25512 Pain in left shoulder: Secondary | ICD-10-CM
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
## 2018-07-27 NOTE — Patient Instructions (Signed)

## 2018-08-03 ENCOUNTER — Ambulatory Visit (HOSPITAL_COMMUNITY): Payer: Medicare Other | Attending: Orthopaedic Surgery

## 2018-08-03 DIAGNOSIS — R29898 Other symptoms and signs involving the musculoskeletal system: Secondary | ICD-10-CM | POA: Diagnosis not present

## 2018-08-03 DIAGNOSIS — M25612 Stiffness of left shoulder, not elsewhere classified: Secondary | ICD-10-CM | POA: Insufficient documentation

## 2018-08-03 DIAGNOSIS — M25512 Pain in left shoulder: Secondary | ICD-10-CM | POA: Insufficient documentation
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
## 2018-08-05 ENCOUNTER — Ambulatory Visit (HOSPITAL_COMMUNITY): Payer: Medicare Other

## 2018-08-05 ENCOUNTER — Encounter (HOSPITAL_COMMUNITY): Payer: Self-pay

## 2018-08-05 DIAGNOSIS — M25612 Stiffness of left shoulder, not elsewhere classified: Secondary | ICD-10-CM | POA: Diagnosis not present

## 2018-08-05 DIAGNOSIS — M25512 Pain in left shoulder: Secondary | ICD-10-CM | POA: Diagnosis not present

## 2018-08-05 DIAGNOSIS — R29898 Other symptoms and signs involving the musculoskeletal system: Secondary | ICD-10-CM | POA: Diagnosis not present
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
## 2018-08-10 ENCOUNTER — Encounter (HOSPITAL_COMMUNITY): Payer: Self-pay

## 2018-08-10 ENCOUNTER — Ambulatory Visit (HOSPITAL_COMMUNITY): Payer: Medicare Other

## 2018-08-10 DIAGNOSIS — R29898 Other symptoms and signs involving the musculoskeletal system: Secondary | ICD-10-CM

## 2018-08-10 DIAGNOSIS — M25612 Stiffness of left shoulder, not elsewhere classified: Secondary | ICD-10-CM | POA: Diagnosis not present

## 2018-08-10 DIAGNOSIS — M25512 Pain in left shoulder: Secondary | ICD-10-CM | POA: Diagnosis not present
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
## 2018-08-12 ENCOUNTER — Encounter (HOSPITAL_COMMUNITY): Payer: Self-pay

## 2018-08-12 ENCOUNTER — Ambulatory Visit (HOSPITAL_COMMUNITY): Payer: Medicare Other

## 2018-08-12 DIAGNOSIS — R29898 Other symptoms and signs involving the musculoskeletal system: Secondary | ICD-10-CM | POA: Diagnosis not present

## 2018-08-12 DIAGNOSIS — M25512 Pain in left shoulder: Secondary | ICD-10-CM | POA: Diagnosis not present

## 2018-08-12 DIAGNOSIS — M25612 Stiffness of left shoulder, not elsewhere classified: Secondary | ICD-10-CM | POA: Diagnosis not present
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
## 2018-08-17 ENCOUNTER — Ambulatory Visit (HOSPITAL_COMMUNITY): Payer: Medicare Other

## 2018-08-17 ENCOUNTER — Encounter (HOSPITAL_COMMUNITY): Payer: Self-pay

## 2018-08-17 DIAGNOSIS — R29898 Other symptoms and signs involving the musculoskeletal system: Secondary | ICD-10-CM

## 2018-08-17 DIAGNOSIS — M25612 Stiffness of left shoulder, not elsewhere classified: Secondary | ICD-10-CM | POA: Diagnosis not present

## 2018-08-17 DIAGNOSIS — M25512 Pain in left shoulder: Secondary | ICD-10-CM
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
## 2018-08-19 ENCOUNTER — Ambulatory Visit (HOSPITAL_COMMUNITY): Payer: Medicare Other

## 2018-08-19 ENCOUNTER — Encounter (HOSPITAL_COMMUNITY): Payer: Self-pay

## 2018-08-19 DIAGNOSIS — M25612 Stiffness of left shoulder, not elsewhere classified: Secondary | ICD-10-CM

## 2018-08-19 DIAGNOSIS — R29898 Other symptoms and signs involving the musculoskeletal system: Secondary | ICD-10-CM | POA: Diagnosis not present

## 2018-08-19 DIAGNOSIS — M25512 Pain in left shoulder: Secondary | ICD-10-CM | POA: Diagnosis not present
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
## 2018-08-24 ENCOUNTER — Ambulatory Visit (HOSPITAL_COMMUNITY): Payer: Medicare Other

## 2018-08-24 ENCOUNTER — Encounter (HOSPITAL_COMMUNITY): Payer: Self-pay

## 2018-08-24 DIAGNOSIS — M25512 Pain in left shoulder: Secondary | ICD-10-CM

## 2018-08-24 DIAGNOSIS — R29898 Other symptoms and signs involving the musculoskeletal system: Secondary | ICD-10-CM | POA: Diagnosis not present

## 2018-08-24 DIAGNOSIS — M25612 Stiffness of left shoulder, not elsewhere classified: Secondary | ICD-10-CM
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
## 2018-08-26 ENCOUNTER — Encounter (HOSPITAL_COMMUNITY): Payer: Self-pay

## 2018-08-26 ENCOUNTER — Ambulatory Visit (HOSPITAL_COMMUNITY): Payer: Medicare Other

## 2018-08-26 DIAGNOSIS — M25612 Stiffness of left shoulder, not elsewhere classified: Secondary | ICD-10-CM | POA: Diagnosis not present

## 2018-08-26 DIAGNOSIS — R29898 Other symptoms and signs involving the musculoskeletal system: Secondary | ICD-10-CM

## 2018-08-26 DIAGNOSIS — M25512 Pain in left shoulder: Secondary | ICD-10-CM | POA: Diagnosis not present
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
## 2018-08-31 ENCOUNTER — Ambulatory Visit (HOSPITAL_COMMUNITY): Payer: Medicare Other

## 2018-08-31 DIAGNOSIS — M25612 Stiffness of left shoulder, not elsewhere classified: Secondary | ICD-10-CM | POA: Diagnosis not present

## 2018-08-31 DIAGNOSIS — M25512 Pain in left shoulder: Secondary | ICD-10-CM | POA: Diagnosis not present

## 2018-08-31 DIAGNOSIS — R29898 Other symptoms and signs involving the musculoskeletal system: Secondary | ICD-10-CM | POA: Diagnosis not present
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
## 2018-09-02 ENCOUNTER — Encounter (HOSPITAL_COMMUNITY): Payer: Self-pay

## 2018-09-02 ENCOUNTER — Ambulatory Visit (HOSPITAL_COMMUNITY): Payer: Medicare Other | Attending: Orthopaedic Surgery

## 2018-09-02 DIAGNOSIS — M25512 Pain in left shoulder: Secondary | ICD-10-CM | POA: Insufficient documentation

## 2018-09-02 DIAGNOSIS — M25612 Stiffness of left shoulder, not elsewhere classified: Secondary | ICD-10-CM | POA: Diagnosis not present

## 2018-09-02 DIAGNOSIS — R29898 Other symptoms and signs involving the musculoskeletal system: Secondary | ICD-10-CM | POA: Insufficient documentation

## 2018-09-02 NOTE — Patient Instructions (Signed)
Repeat all exercises 10 times, 1-2 times per day. Use 1lb hand weight, or water bottle, or can of soup.  1) Shoulder Protraction    Begin with elbows by your side, slowly "punch" straight out in front of you.      2) Shoulder Flexion (raise to shoulder level)  Standing:         Begin with arms at your side with thumbs pointed up, slowly raise both arms up and forward towards overhead.           3) Horizontal abduction/adduction (try 1lb or only use arm weight only)    Standing:           Begin with arms straight out in front of you, bring out to the side in at "T" shape. Keep arms straight entire time.          4) Internal & External Rotation    *No band* -Stand with elbows at the side and elbows bent 90 degrees. Move your forearms away from your body, then bring back inward toward the body.     5) Shoulder Abduction  Standing: (try to get to shoulder level)      Begin with your arms flat on the table next to your side. Slowly move your arms out to the side so that they go overhead, in a jumping jack or snow angel movement.
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
## 2018-09-07 ENCOUNTER — Encounter (HOSPITAL_COMMUNITY): Payer: Self-pay

## 2018-09-07 ENCOUNTER — Ambulatory Visit (HOSPITAL_COMMUNITY): Payer: Medicare Other

## 2018-09-07 DIAGNOSIS — M25512 Pain in left shoulder: Secondary | ICD-10-CM

## 2018-09-07 DIAGNOSIS — M25612 Stiffness of left shoulder, not elsewhere classified: Secondary | ICD-10-CM

## 2018-09-07 DIAGNOSIS — R29898 Other symptoms and signs involving the musculoskeletal system: Secondary | ICD-10-CM
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
## 2018-09-09 ENCOUNTER — Ambulatory Visit (HOSPITAL_COMMUNITY): Payer: Medicare Other

## 2018-09-09 ENCOUNTER — Encounter (HOSPITAL_COMMUNITY): Payer: Self-pay

## 2018-09-09 DIAGNOSIS — R29898 Other symptoms and signs involving the musculoskeletal system: Secondary | ICD-10-CM

## 2018-09-09 DIAGNOSIS — M25512 Pain in left shoulder: Secondary | ICD-10-CM | POA: Diagnosis not present

## 2018-09-09 DIAGNOSIS — M25612 Stiffness of left shoulder, not elsewhere classified: Secondary | ICD-10-CM | POA: Diagnosis not present
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
## 2018-09-14 ENCOUNTER — Encounter (HOSPITAL_COMMUNITY): Payer: Self-pay

## 2018-09-14 ENCOUNTER — Ambulatory Visit (HOSPITAL_COMMUNITY): Payer: Medicare Other

## 2018-09-14 DIAGNOSIS — R29898 Other symptoms and signs involving the musculoskeletal system: Secondary | ICD-10-CM | POA: Diagnosis not present

## 2018-09-14 DIAGNOSIS — M25512 Pain in left shoulder: Secondary | ICD-10-CM | POA: Diagnosis not present

## 2018-09-14 DIAGNOSIS — M25612 Stiffness of left shoulder, not elsewhere classified: Secondary | ICD-10-CM
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
## 2018-09-16 ENCOUNTER — Ambulatory Visit (HOSPITAL_COMMUNITY): Payer: Medicare Other

## 2018-09-16 ENCOUNTER — Encounter (HOSPITAL_COMMUNITY): Payer: Self-pay

## 2018-09-16 DIAGNOSIS — E782 Mixed hyperlipidemia: Secondary | ICD-10-CM | POA: Diagnosis not present

## 2018-09-16 DIAGNOSIS — M25512 Pain in left shoulder: Secondary | ICD-10-CM | POA: Diagnosis not present

## 2018-09-16 DIAGNOSIS — E039 Hypothyroidism, unspecified: Secondary | ICD-10-CM | POA: Diagnosis not present

## 2018-09-16 DIAGNOSIS — I10 Essential (primary) hypertension: Secondary | ICD-10-CM | POA: Diagnosis not present

## 2018-09-16 DIAGNOSIS — Z6841 Body Mass Index (BMI) 40.0 and over, adult: Secondary | ICD-10-CM | POA: Diagnosis not present

## 2018-09-16 DIAGNOSIS — R29898 Other symptoms and signs involving the musculoskeletal system: Secondary | ICD-10-CM

## 2018-09-16 DIAGNOSIS — M25612 Stiffness of left shoulder, not elsewhere classified: Secondary | ICD-10-CM | POA: Diagnosis not present

## 2018-09-16 DIAGNOSIS — D508 Other iron deficiency anemias: Secondary | ICD-10-CM | POA: Diagnosis not present

## 2018-09-16 DIAGNOSIS — E119 Type 2 diabetes mellitus without complications: Secondary | ICD-10-CM | POA: Diagnosis not present
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
## 2018-09-17 DIAGNOSIS — I152 Hypertension secondary to endocrine disorders: Secondary | ICD-10-CM | POA: Insufficient documentation

## 2018-09-17 DIAGNOSIS — I10 Essential (primary) hypertension: Secondary | ICD-10-CM | POA: Insufficient documentation

## 2018-09-21 ENCOUNTER — Encounter (HOSPITAL_COMMUNITY): Payer: Self-pay | Admitting: Occupational Therapy

## 2018-09-21 ENCOUNTER — Ambulatory Visit (HOSPITAL_COMMUNITY): Payer: Medicare Other | Admitting: Occupational Therapy

## 2018-09-21 DIAGNOSIS — M25612 Stiffness of left shoulder, not elsewhere classified: Secondary | ICD-10-CM

## 2018-09-21 DIAGNOSIS — M25512 Pain in left shoulder: Secondary | ICD-10-CM

## 2018-09-21 DIAGNOSIS — E782 Mixed hyperlipidemia: Secondary | ICD-10-CM | POA: Diagnosis not present

## 2018-09-21 DIAGNOSIS — I10 Essential (primary) hypertension: Secondary | ICD-10-CM | POA: Diagnosis not present

## 2018-09-21 DIAGNOSIS — R29898 Other symptoms and signs involving the musculoskeletal system: Secondary | ICD-10-CM

## 2018-09-21 DIAGNOSIS — E119 Type 2 diabetes mellitus without complications: Secondary | ICD-10-CM | POA: Diagnosis not present

## 2018-09-21 DIAGNOSIS — H353131 Nonexudative age-related macular degeneration, bilateral, early dry stage: Secondary | ICD-10-CM | POA: Diagnosis not present

## 2018-09-21 DIAGNOSIS — E039 Hypothyroidism, unspecified: Secondary | ICD-10-CM | POA: Diagnosis not present

## 2018-09-21 DIAGNOSIS — D649 Anemia, unspecified: Secondary | ICD-10-CM | POA: Diagnosis not present

## 2018-09-21 DIAGNOSIS — K219 Gastro-esophageal reflux disease without esophagitis: Secondary | ICD-10-CM | POA: Diagnosis not present
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
## 2018-09-23 ENCOUNTER — Ambulatory Visit (HOSPITAL_COMMUNITY): Payer: Medicare Other

## 2018-09-23 DIAGNOSIS — M25612 Stiffness of left shoulder, not elsewhere classified: Secondary | ICD-10-CM

## 2018-09-23 DIAGNOSIS — M25512 Pain in left shoulder: Secondary | ICD-10-CM

## 2018-09-23 DIAGNOSIS — R29898 Other symptoms and signs involving the musculoskeletal system: Secondary | ICD-10-CM
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
## 2018-09-28 ENCOUNTER — Ambulatory Visit (HOSPITAL_COMMUNITY): Payer: Medicare Other

## 2018-09-28 ENCOUNTER — Encounter (HOSPITAL_COMMUNITY): Payer: Self-pay

## 2018-09-28 DIAGNOSIS — M25612 Stiffness of left shoulder, not elsewhere classified: Secondary | ICD-10-CM

## 2018-09-28 DIAGNOSIS — R29898 Other symptoms and signs involving the musculoskeletal system: Secondary | ICD-10-CM

## 2018-09-28 DIAGNOSIS — M25512 Pain in left shoulder: Secondary | ICD-10-CM | POA: Diagnosis not present
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
## 2018-09-30 ENCOUNTER — Encounter (HOSPITAL_COMMUNITY): Payer: Self-pay | Admitting: Specialist

## 2018-09-30 ENCOUNTER — Ambulatory Visit (HOSPITAL_COMMUNITY): Payer: Medicare Other | Admitting: Specialist

## 2018-09-30 DIAGNOSIS — M25512 Pain in left shoulder: Secondary | ICD-10-CM

## 2018-09-30 DIAGNOSIS — M25612 Stiffness of left shoulder, not elsewhere classified: Secondary | ICD-10-CM | POA: Diagnosis not present

## 2018-09-30 DIAGNOSIS — R29898 Other symptoms and signs involving the musculoskeletal system: Secondary | ICD-10-CM | POA: Diagnosis not present
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
## 2018-10-07 DIAGNOSIS — S42202D Unspecified fracture of upper end of left humerus, subsequent encounter for fracture with routine healing: Secondary | ICD-10-CM | POA: Diagnosis not present

## 2018-10-07 DIAGNOSIS — Z6841 Body Mass Index (BMI) 40.0 and over, adult: Secondary | ICD-10-CM | POA: Diagnosis not present

## 2018-10-12 ENCOUNTER — Encounter (HOSPITAL_COMMUNITY): Payer: Medicare Other

## 2018-10-12 DIAGNOSIS — E119 Type 2 diabetes mellitus without complications: Secondary | ICD-10-CM | POA: Diagnosis not present

## 2018-10-12 DIAGNOSIS — E349 Endocrine disorder, unspecified: Secondary | ICD-10-CM | POA: Diagnosis not present

## 2018-10-12 DIAGNOSIS — Z780 Asymptomatic menopausal state: Secondary | ICD-10-CM | POA: Diagnosis not present

## 2018-10-12 DIAGNOSIS — M810 Age-related osteoporosis without current pathological fracture: Secondary | ICD-10-CM | POA: Diagnosis not present

## 2018-10-12 DIAGNOSIS — R29890 Loss of height: Secondary | ICD-10-CM | POA: Diagnosis not present

## 2018-10-12 DIAGNOSIS — Z8262 Family history of osteoporosis: Secondary | ICD-10-CM | POA: Diagnosis not present

## 2018-10-12 DIAGNOSIS — R5383 Other fatigue: Secondary | ICD-10-CM | POA: Diagnosis not present

## 2018-10-12 DIAGNOSIS — E559 Vitamin D deficiency, unspecified: Secondary | ICD-10-CM | POA: Diagnosis not present

## 2018-10-14 ENCOUNTER — Encounter (HOSPITAL_COMMUNITY): Payer: Medicare Other

## 2018-10-15 ENCOUNTER — Other Ambulatory Visit: Payer: Self-pay

## 2018-10-15 ENCOUNTER — Encounter (HOSPITAL_COMMUNITY): Payer: Self-pay

## 2018-10-15 ENCOUNTER — Ambulatory Visit (HOSPITAL_COMMUNITY): Payer: Medicare Other | Attending: Orthopaedic Surgery

## 2018-10-15 DIAGNOSIS — R29898 Other symptoms and signs involving the musculoskeletal system: Secondary | ICD-10-CM | POA: Diagnosis not present

## 2018-10-15 DIAGNOSIS — M62838 Other muscle spasm: Secondary | ICD-10-CM | POA: Diagnosis not present

## 2018-10-15 DIAGNOSIS — M25612 Stiffness of left shoulder, not elsewhere classified: Secondary | ICD-10-CM
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
## 2018-10-19 ENCOUNTER — Encounter (HOSPITAL_COMMUNITY): Payer: Self-pay

## 2018-10-19 ENCOUNTER — Ambulatory Visit (HOSPITAL_COMMUNITY): Payer: Medicare Other

## 2018-10-19 ENCOUNTER — Encounter (HOSPITAL_COMMUNITY): Payer: Medicare Other | Admitting: Occupational Therapy

## 2018-10-19 DIAGNOSIS — M25612 Stiffness of left shoulder, not elsewhere classified: Secondary | ICD-10-CM

## 2018-10-19 DIAGNOSIS — R29898 Other symptoms and signs involving the musculoskeletal system: Secondary | ICD-10-CM

## 2018-10-19 DIAGNOSIS — M62838 Other muscle spasm: Secondary | ICD-10-CM | POA: Diagnosis not present

## 2018-10-19 NOTE — Patient Instructions (Signed)
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
## 2018-10-21 ENCOUNTER — Encounter (HOSPITAL_COMMUNITY): Payer: Medicare Other

## 2018-10-21 DIAGNOSIS — M545 Low back pain: Secondary | ICD-10-CM | POA: Diagnosis not present

## 2018-10-21 DIAGNOSIS — M25551 Pain in right hip: Secondary | ICD-10-CM | POA: Diagnosis not present

## 2018-10-21 DIAGNOSIS — M546 Pain in thoracic spine: Secondary | ICD-10-CM | POA: Diagnosis not present

## 2018-10-21 DIAGNOSIS — R51 Headache: Secondary | ICD-10-CM | POA: Diagnosis not present

## 2018-10-22 ENCOUNTER — Encounter (HOSPITAL_COMMUNITY): Payer: Self-pay

## 2018-10-22 ENCOUNTER — Ambulatory Visit (HOSPITAL_COMMUNITY): Payer: Medicare Other

## 2018-10-22 DIAGNOSIS — M25612 Stiffness of left shoulder, not elsewhere classified: Secondary | ICD-10-CM | POA: Diagnosis not present

## 2018-10-22 DIAGNOSIS — M62838 Other muscle spasm: Secondary | ICD-10-CM | POA: Diagnosis not present

## 2018-10-22 DIAGNOSIS — R29898 Other symptoms and signs involving the musculoskeletal system: Secondary | ICD-10-CM
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
## 2018-10-26 ENCOUNTER — Ambulatory Visit (HOSPITAL_COMMUNITY): Payer: Medicare Other

## 2018-10-26 ENCOUNTER — Encounter (HOSPITAL_COMMUNITY): Payer: Self-pay

## 2018-10-26 ENCOUNTER — Encounter (HOSPITAL_COMMUNITY): Payer: Medicare Other | Admitting: Occupational Therapy

## 2018-10-26 DIAGNOSIS — R29898 Other symptoms and signs involving the musculoskeletal system: Secondary | ICD-10-CM

## 2018-10-26 DIAGNOSIS — M25612 Stiffness of left shoulder, not elsewhere classified: Secondary | ICD-10-CM

## 2018-10-26 DIAGNOSIS — M62838 Other muscle spasm: Secondary | ICD-10-CM
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
## 2018-10-28 ENCOUNTER — Ambulatory Visit (HOSPITAL_COMMUNITY): Payer: Medicare Other

## 2018-11-02 ENCOUNTER — Encounter (HOSPITAL_COMMUNITY): Payer: Self-pay

## 2018-11-02 ENCOUNTER — Ambulatory Visit (HOSPITAL_COMMUNITY): Payer: Medicare Other | Attending: Orthopaedic Surgery

## 2018-11-02 DIAGNOSIS — R29898 Other symptoms and signs involving the musculoskeletal system: Secondary | ICD-10-CM | POA: Diagnosis not present

## 2018-11-02 DIAGNOSIS — M62838 Other muscle spasm: Secondary | ICD-10-CM

## 2018-11-02 DIAGNOSIS — M25612 Stiffness of left shoulder, not elsewhere classified: Secondary | ICD-10-CM | POA: Insufficient documentation
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
## 2018-11-03 ENCOUNTER — Other Ambulatory Visit: Payer: Self-pay | Admitting: Internal Medicine

## 2018-11-03 DIAGNOSIS — Z1231 Encounter for screening mammogram for malignant neoplasm of breast: Secondary | ICD-10-CM

## 2018-11-04 ENCOUNTER — Encounter (HOSPITAL_COMMUNITY): Payer: Self-pay

## 2018-11-04 ENCOUNTER — Ambulatory Visit (HOSPITAL_COMMUNITY): Payer: Medicare Other

## 2018-11-04 DIAGNOSIS — R29898 Other symptoms and signs involving the musculoskeletal system: Secondary | ICD-10-CM

## 2018-11-04 DIAGNOSIS — M62838 Other muscle spasm: Secondary | ICD-10-CM | POA: Diagnosis not present

## 2018-11-04 DIAGNOSIS — M25612 Stiffness of left shoulder, not elsewhere classified: Secondary | ICD-10-CM | POA: Diagnosis not present
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
## 2018-11-19 ENCOUNTER — Encounter (HOSPITAL_COMMUNITY): Payer: Self-pay

## 2018-11-19 ENCOUNTER — Telehealth (HOSPITAL_COMMUNITY): Payer: Self-pay

## 2018-11-19 NOTE — Telephone Encounter (Signed)
Called and informed pt that our clinic would be closed for 2 weeks due to COVID-19. Asked if she wanted to reschedule her appt on 4/2 or be d/c and she wished to be d/c at this time due to being pleased with her functional status currently.   Geraldine Solar PT, DPT

## 2018-11-19 NOTE — Therapy (Signed)
[place], [place], [address] Phone: [phone]   Fax:  [phone]  Patient Details  Name: Julie Bean MRN: [id number] Date of Birth: [birth year] Referring Provider:  No ref. provider found  Encounter Date: 11/19/2018  PHYSICAL THERAPY DISCHARGE SUMMARY  Visits from Start of Care: 6  Current functional level related to goals / functional outcomes: See last note   Remaining deficits: See last note   Education / Equipment: n/a  Plan: Patient agrees to discharge.  Patient goals were partially met. Patient is being discharged due to being pleased with the current functional level.  ?????     Geraldine Solar PT, [place] [address] [place], [place], [address] Phone: [phone]   Fax:  [phone]

## 2018-12-01 ENCOUNTER — Ambulatory Visit: Payer: Medicare Other

## 2018-12-02 ENCOUNTER — Encounter (HOSPITAL_COMMUNITY): Payer: Medicare Other

## 2019-01-19 ENCOUNTER — Ambulatory Visit: Payer: Medicare Other

## 2019-02-01 DIAGNOSIS — E118 Type 2 diabetes mellitus with unspecified complications: Secondary | ICD-10-CM | POA: Diagnosis not present

## 2019-02-01 DIAGNOSIS — Q6689 Other  specified congenital deformities of feet: Secondary | ICD-10-CM | POA: Diagnosis not present

## 2019-02-01 DIAGNOSIS — L851 Acquired keratosis [keratoderma] palmaris et plantaris: Secondary | ICD-10-CM | POA: Diagnosis not present

## 2019-02-08 DIAGNOSIS — E782 Mixed hyperlipidemia: Secondary | ICD-10-CM | POA: Diagnosis not present

## 2019-02-08 DIAGNOSIS — D508 Other iron deficiency anemias: Secondary | ICD-10-CM | POA: Diagnosis not present

## 2019-02-08 DIAGNOSIS — I10 Essential (primary) hypertension: Secondary | ICD-10-CM | POA: Diagnosis not present

## 2019-02-08 DIAGNOSIS — E039 Hypothyroidism, unspecified: Secondary | ICD-10-CM | POA: Diagnosis not present

## 2019-02-08 DIAGNOSIS — E119 Type 2 diabetes mellitus without complications: Secondary | ICD-10-CM | POA: Diagnosis not present

## 2019-02-15 DIAGNOSIS — H3530 Unspecified macular degeneration: Secondary | ICD-10-CM | POA: Diagnosis not present

## 2019-02-15 DIAGNOSIS — E039 Hypothyroidism, unspecified: Secondary | ICD-10-CM | POA: Diagnosis not present

## 2019-02-15 DIAGNOSIS — E119 Type 2 diabetes mellitus without complications: Secondary | ICD-10-CM | POA: Diagnosis not present

## 2019-02-15 DIAGNOSIS — K219 Gastro-esophageal reflux disease without esophagitis: Secondary | ICD-10-CM | POA: Diagnosis not present

## 2019-02-15 DIAGNOSIS — E1165 Type 2 diabetes mellitus with hyperglycemia: Secondary | ICD-10-CM | POA: Diagnosis not present

## 2019-02-15 DIAGNOSIS — H353131 Nonexudative age-related macular degeneration, bilateral, early dry stage: Secondary | ICD-10-CM | POA: Diagnosis not present

## 2019-02-15 DIAGNOSIS — Z0000 Encounter for general adult medical examination without abnormal findings: Secondary | ICD-10-CM | POA: Diagnosis not present

## 2019-02-15 DIAGNOSIS — D649 Anemia, unspecified: Secondary | ICD-10-CM | POA: Diagnosis not present

## 2019-02-15 DIAGNOSIS — G4451 Hemicrania continua: Secondary | ICD-10-CM | POA: Diagnosis not present

## 2019-02-15 DIAGNOSIS — I10 Essential (primary) hypertension: Secondary | ICD-10-CM | POA: Diagnosis not present

## 2019-02-15 DIAGNOSIS — E782 Mixed hyperlipidemia: Secondary | ICD-10-CM | POA: Diagnosis not present

## 2019-02-15 DIAGNOSIS — Z124 Encounter for screening for malignant neoplasm of cervix: Secondary | ICD-10-CM | POA: Diagnosis not present

## 2019-02-15 DIAGNOSIS — G44001 Cluster headache syndrome, unspecified, intractable: Secondary | ICD-10-CM | POA: Diagnosis not present

## 2019-02-15 DIAGNOSIS — M79605 Pain in left leg: Secondary | ICD-10-CM | POA: Diagnosis not present

## 2019-02-23 DIAGNOSIS — M9902 Segmental and somatic dysfunction of thoracic region: Secondary | ICD-10-CM | POA: Diagnosis not present

## 2019-02-23 DIAGNOSIS — M9903 Segmental and somatic dysfunction of lumbar region: Secondary | ICD-10-CM | POA: Diagnosis not present

## 2019-02-23 DIAGNOSIS — M25551 Pain in right hip: Secondary | ICD-10-CM | POA: Diagnosis not present

## 2019-02-23 DIAGNOSIS — M545 Low back pain: Secondary | ICD-10-CM | POA: Diagnosis not present

## 2019-02-23 DIAGNOSIS — M9905 Segmental and somatic dysfunction of pelvic region: Secondary | ICD-10-CM | POA: Diagnosis not present

## 2019-02-25 DIAGNOSIS — M25551 Pain in right hip: Secondary | ICD-10-CM | POA: Diagnosis not present

## 2019-02-25 DIAGNOSIS — M9902 Segmental and somatic dysfunction of thoracic region: Secondary | ICD-10-CM | POA: Diagnosis not present

## 2019-02-25 DIAGNOSIS — M9903 Segmental and somatic dysfunction of lumbar region: Secondary | ICD-10-CM | POA: Diagnosis not present

## 2019-02-25 DIAGNOSIS — M545 Low back pain: Secondary | ICD-10-CM | POA: Diagnosis not present

## 2019-02-25 DIAGNOSIS — M9905 Segmental and somatic dysfunction of pelvic region: Secondary | ICD-10-CM | POA: Diagnosis not present

## 2019-02-28 DIAGNOSIS — M9903 Segmental and somatic dysfunction of lumbar region: Secondary | ICD-10-CM | POA: Diagnosis not present

## 2019-02-28 DIAGNOSIS — M25551 Pain in right hip: Secondary | ICD-10-CM | POA: Diagnosis not present

## 2019-02-28 DIAGNOSIS — M9902 Segmental and somatic dysfunction of thoracic region: Secondary | ICD-10-CM | POA: Diagnosis not present

## 2019-02-28 DIAGNOSIS — M9905 Segmental and somatic dysfunction of pelvic region: Secondary | ICD-10-CM | POA: Diagnosis not present

## 2019-02-28 DIAGNOSIS — M545 Low back pain: Secondary | ICD-10-CM | POA: Diagnosis not present

## 2019-03-02 ENCOUNTER — Other Ambulatory Visit: Payer: Self-pay

## 2019-03-02 ENCOUNTER — Ambulatory Visit
Admission: RE | Admit: 2019-03-02 | Discharge: 2019-03-02 | Disposition: A | Discharge status: Home / Self Care | Payer: Medicare Other | Source: Ambulatory Visit | Attending: Internal Medicine | Admitting: Internal Medicine

## 2019-03-02 DIAGNOSIS — Z1231 Encounter for screening mammogram for malignant neoplasm of breast: Secondary | ICD-10-CM | POA: Diagnosis not present

## 2019-03-03 DIAGNOSIS — M545 Low back pain: Secondary | ICD-10-CM | POA: Diagnosis not present

## 2019-03-03 DIAGNOSIS — M9905 Segmental and somatic dysfunction of pelvic region: Secondary | ICD-10-CM | POA: Diagnosis not present

## 2019-03-03 DIAGNOSIS — M9903 Segmental and somatic dysfunction of lumbar region: Secondary | ICD-10-CM | POA: Diagnosis not present

## 2019-03-03 DIAGNOSIS — M9902 Segmental and somatic dysfunction of thoracic region: Secondary | ICD-10-CM | POA: Diagnosis not present

## 2019-03-03 DIAGNOSIS — M25551 Pain in right hip: Secondary | ICD-10-CM | POA: Diagnosis not present

## 2019-03-07 DIAGNOSIS — M545 Low back pain: Secondary | ICD-10-CM | POA: Diagnosis not present

## 2019-03-07 DIAGNOSIS — M25551 Pain in right hip: Secondary | ICD-10-CM | POA: Diagnosis not present

## 2019-03-07 DIAGNOSIS — M9903 Segmental and somatic dysfunction of lumbar region: Secondary | ICD-10-CM | POA: Diagnosis not present

## 2019-03-07 DIAGNOSIS — M9905 Segmental and somatic dysfunction of pelvic region: Secondary | ICD-10-CM | POA: Diagnosis not present

## 2019-03-07 DIAGNOSIS — M9902 Segmental and somatic dysfunction of thoracic region: Secondary | ICD-10-CM | POA: Diagnosis not present

## 2019-03-11 DIAGNOSIS — M545 Low back pain: Secondary | ICD-10-CM | POA: Diagnosis not present

## 2019-03-11 DIAGNOSIS — M9903 Segmental and somatic dysfunction of lumbar region: Secondary | ICD-10-CM | POA: Diagnosis not present

## 2019-03-11 DIAGNOSIS — M9905 Segmental and somatic dysfunction of pelvic region: Secondary | ICD-10-CM | POA: Diagnosis not present

## 2019-03-11 DIAGNOSIS — M9902 Segmental and somatic dysfunction of thoracic region: Secondary | ICD-10-CM | POA: Diagnosis not present

## 2019-03-11 DIAGNOSIS — M25551 Pain in right hip: Secondary | ICD-10-CM | POA: Diagnosis not present

## 2019-03-14 DIAGNOSIS — M9902 Segmental and somatic dysfunction of thoracic region: Secondary | ICD-10-CM | POA: Diagnosis not present

## 2019-03-14 DIAGNOSIS — M9905 Segmental and somatic dysfunction of pelvic region: Secondary | ICD-10-CM | POA: Diagnosis not present

## 2019-03-14 DIAGNOSIS — M545 Low back pain: Secondary | ICD-10-CM | POA: Diagnosis not present

## 2019-03-14 DIAGNOSIS — M9903 Segmental and somatic dysfunction of lumbar region: Secondary | ICD-10-CM | POA: Diagnosis not present

## 2019-03-14 DIAGNOSIS — M25551 Pain in right hip: Secondary | ICD-10-CM | POA: Diagnosis not present

## 2019-03-16 DIAGNOSIS — M9903 Segmental and somatic dysfunction of lumbar region: Secondary | ICD-10-CM | POA: Diagnosis not present

## 2019-03-16 DIAGNOSIS — M9905 Segmental and somatic dysfunction of pelvic region: Secondary | ICD-10-CM | POA: Diagnosis not present

## 2019-03-16 DIAGNOSIS — M25551 Pain in right hip: Secondary | ICD-10-CM | POA: Diagnosis not present

## 2019-03-16 DIAGNOSIS — M545 Low back pain: Secondary | ICD-10-CM | POA: Diagnosis not present

## 2019-03-16 DIAGNOSIS — M9902 Segmental and somatic dysfunction of thoracic region: Secondary | ICD-10-CM | POA: Diagnosis not present

## 2019-03-21 DIAGNOSIS — M9905 Segmental and somatic dysfunction of pelvic region: Secondary | ICD-10-CM | POA: Diagnosis not present

## 2019-03-21 DIAGNOSIS — M545 Low back pain: Secondary | ICD-10-CM | POA: Diagnosis not present

## 2019-03-21 DIAGNOSIS — M9903 Segmental and somatic dysfunction of lumbar region: Secondary | ICD-10-CM | POA: Diagnosis not present

## 2019-03-21 DIAGNOSIS — M9902 Segmental and somatic dysfunction of thoracic region: Secondary | ICD-10-CM | POA: Diagnosis not present

## 2019-03-21 DIAGNOSIS — M25551 Pain in right hip: Secondary | ICD-10-CM | POA: Diagnosis not present

## 2019-03-23 DIAGNOSIS — M545 Low back pain: Secondary | ICD-10-CM | POA: Diagnosis not present

## 2019-03-23 DIAGNOSIS — M9903 Segmental and somatic dysfunction of lumbar region: Secondary | ICD-10-CM | POA: Diagnosis not present

## 2019-03-23 DIAGNOSIS — M9905 Segmental and somatic dysfunction of pelvic region: Secondary | ICD-10-CM | POA: Diagnosis not present

## 2019-03-23 DIAGNOSIS — M9902 Segmental and somatic dysfunction of thoracic region: Secondary | ICD-10-CM | POA: Diagnosis not present

## 2019-03-23 DIAGNOSIS — M25551 Pain in right hip: Secondary | ICD-10-CM | POA: Diagnosis not present

## 2019-03-28 DIAGNOSIS — M9905 Segmental and somatic dysfunction of pelvic region: Secondary | ICD-10-CM | POA: Diagnosis not present

## 2019-03-28 DIAGNOSIS — M9902 Segmental and somatic dysfunction of thoracic region: Secondary | ICD-10-CM | POA: Diagnosis not present

## 2019-03-28 DIAGNOSIS — M9903 Segmental and somatic dysfunction of lumbar region: Secondary | ICD-10-CM | POA: Diagnosis not present

## 2019-03-28 DIAGNOSIS — M25551 Pain in right hip: Secondary | ICD-10-CM | POA: Diagnosis not present

## 2019-03-28 DIAGNOSIS — M545 Low back pain: Secondary | ICD-10-CM | POA: Diagnosis not present

## 2019-03-30 DIAGNOSIS — M545 Low back pain: Secondary | ICD-10-CM | POA: Diagnosis not present

## 2019-03-30 DIAGNOSIS — M9905 Segmental and somatic dysfunction of pelvic region: Secondary | ICD-10-CM | POA: Diagnosis not present

## 2019-03-30 DIAGNOSIS — M25551 Pain in right hip: Secondary | ICD-10-CM | POA: Diagnosis not present

## 2019-03-30 DIAGNOSIS — M9902 Segmental and somatic dysfunction of thoracic region: Secondary | ICD-10-CM | POA: Diagnosis not present

## 2019-03-30 DIAGNOSIS — M9903 Segmental and somatic dysfunction of lumbar region: Secondary | ICD-10-CM | POA: Diagnosis not present

## 2019-04-01 ENCOUNTER — Other Ambulatory Visit: Payer: Self-pay

## 2019-04-04 DIAGNOSIS — M545 Low back pain: Secondary | ICD-10-CM | POA: Diagnosis not present

## 2019-04-04 DIAGNOSIS — M9905 Segmental and somatic dysfunction of pelvic region: Secondary | ICD-10-CM | POA: Diagnosis not present

## 2019-04-04 DIAGNOSIS — M9902 Segmental and somatic dysfunction of thoracic region: Secondary | ICD-10-CM | POA: Diagnosis not present

## 2019-04-04 DIAGNOSIS — M25551 Pain in right hip: Secondary | ICD-10-CM | POA: Diagnosis not present

## 2019-04-04 DIAGNOSIS — M9903 Segmental and somatic dysfunction of lumbar region: Secondary | ICD-10-CM | POA: Diagnosis not present

## 2019-04-05 DIAGNOSIS — M25512 Pain in left shoulder: Secondary | ICD-10-CM | POA: Diagnosis not present

## 2019-04-05 DIAGNOSIS — Z6841 Body Mass Index (BMI) 40.0 and over, adult: Secondary | ICD-10-CM | POA: Diagnosis not present

## 2019-04-06 DIAGNOSIS — M9903 Segmental and somatic dysfunction of lumbar region: Secondary | ICD-10-CM | POA: Diagnosis not present

## 2019-04-06 DIAGNOSIS — M9905 Segmental and somatic dysfunction of pelvic region: Secondary | ICD-10-CM | POA: Diagnosis not present

## 2019-04-06 DIAGNOSIS — M25551 Pain in right hip: Secondary | ICD-10-CM | POA: Diagnosis not present

## 2019-04-06 DIAGNOSIS — M545 Low back pain: Secondary | ICD-10-CM | POA: Diagnosis not present

## 2019-04-06 DIAGNOSIS — M9902 Segmental and somatic dysfunction of thoracic region: Secondary | ICD-10-CM | POA: Diagnosis not present

## 2019-04-13 DIAGNOSIS — M9903 Segmental and somatic dysfunction of lumbar region: Secondary | ICD-10-CM | POA: Diagnosis not present

## 2019-04-13 DIAGNOSIS — M9905 Segmental and somatic dysfunction of pelvic region: Secondary | ICD-10-CM | POA: Diagnosis not present

## 2019-04-13 DIAGNOSIS — M545 Low back pain: Secondary | ICD-10-CM | POA: Diagnosis not present

## 2019-04-13 DIAGNOSIS — M25551 Pain in right hip: Secondary | ICD-10-CM | POA: Diagnosis not present

## 2019-04-13 DIAGNOSIS — M9902 Segmental and somatic dysfunction of thoracic region: Secondary | ICD-10-CM | POA: Diagnosis not present

## 2019-04-20 DIAGNOSIS — M545 Low back pain: Secondary | ICD-10-CM | POA: Diagnosis not present

## 2019-04-20 DIAGNOSIS — M9905 Segmental and somatic dysfunction of pelvic region: Secondary | ICD-10-CM | POA: Diagnosis not present

## 2019-04-20 DIAGNOSIS — M25551 Pain in right hip: Secondary | ICD-10-CM | POA: Diagnosis not present

## 2019-04-20 DIAGNOSIS — M9902 Segmental and somatic dysfunction of thoracic region: Secondary | ICD-10-CM | POA: Diagnosis not present

## 2019-04-20 DIAGNOSIS — M9903 Segmental and somatic dysfunction of lumbar region: Secondary | ICD-10-CM | POA: Diagnosis not present

## 2019-05-04 DIAGNOSIS — M9902 Segmental and somatic dysfunction of thoracic region: Secondary | ICD-10-CM | POA: Diagnosis not present

## 2019-05-04 DIAGNOSIS — M25551 Pain in right hip: Secondary | ICD-10-CM | POA: Diagnosis not present

## 2019-05-04 DIAGNOSIS — M9903 Segmental and somatic dysfunction of lumbar region: Secondary | ICD-10-CM | POA: Diagnosis not present

## 2019-05-04 DIAGNOSIS — M9905 Segmental and somatic dysfunction of pelvic region: Secondary | ICD-10-CM | POA: Diagnosis not present

## 2019-05-04 DIAGNOSIS — M545 Low back pain: Secondary | ICD-10-CM | POA: Diagnosis not present

## 2019-05-20 DIAGNOSIS — E118 Type 2 diabetes mellitus with unspecified complications: Secondary | ICD-10-CM | POA: Diagnosis not present

## 2019-05-20 DIAGNOSIS — Q6689 Other  specified congenital deformities of feet: Secondary | ICD-10-CM | POA: Diagnosis not present

## 2019-05-20 DIAGNOSIS — L851 Acquired keratosis [keratoderma] palmaris et plantaris: Secondary | ICD-10-CM | POA: Diagnosis not present

## 2019-06-21 DIAGNOSIS — E782 Mixed hyperlipidemia: Secondary | ICD-10-CM | POA: Diagnosis not present

## 2019-06-21 DIAGNOSIS — E039 Hypothyroidism, unspecified: Secondary | ICD-10-CM | POA: Diagnosis not present

## 2019-06-21 DIAGNOSIS — E119 Type 2 diabetes mellitus without complications: Secondary | ICD-10-CM | POA: Diagnosis not present

## 2019-06-21 DIAGNOSIS — D649 Anemia, unspecified: Secondary | ICD-10-CM | POA: Diagnosis not present

## 2019-06-21 DIAGNOSIS — I10 Essential (primary) hypertension: Secondary | ICD-10-CM | POA: Diagnosis not present

## 2019-06-21 DIAGNOSIS — E1165 Type 2 diabetes mellitus with hyperglycemia: Secondary | ICD-10-CM | POA: Diagnosis not present

## 2019-06-24 DIAGNOSIS — Z23 Encounter for immunization: Secondary | ICD-10-CM | POA: Diagnosis not present

## 2019-06-28 DIAGNOSIS — I10 Essential (primary) hypertension: Secondary | ICD-10-CM | POA: Diagnosis not present

## 2019-06-28 DIAGNOSIS — H3530 Unspecified macular degeneration: Secondary | ICD-10-CM | POA: Diagnosis not present

## 2019-06-28 DIAGNOSIS — E039 Hypothyroidism, unspecified: Secondary | ICD-10-CM | POA: Diagnosis not present

## 2019-06-28 DIAGNOSIS — E1165 Type 2 diabetes mellitus with hyperglycemia: Secondary | ICD-10-CM | POA: Diagnosis not present

## 2019-06-28 DIAGNOSIS — E782 Mixed hyperlipidemia: Secondary | ICD-10-CM | POA: Diagnosis not present

## 2019-06-28 DIAGNOSIS — K219 Gastro-esophageal reflux disease without esophagitis: Secondary | ICD-10-CM | POA: Diagnosis not present

## 2019-06-28 DIAGNOSIS — G44001 Cluster headache syndrome, unspecified, intractable: Secondary | ICD-10-CM | POA: Diagnosis not present

## 2019-06-28 DIAGNOSIS — E871 Hypo-osmolality and hyponatremia: Secondary | ICD-10-CM | POA: Diagnosis not present

## 2019-07-05 DIAGNOSIS — R296 Repeated falls: Secondary | ICD-10-CM | POA: Diagnosis not present

## 2019-07-06 DIAGNOSIS — H353112 Nonexudative age-related macular degeneration, right eye, intermediate dry stage: Secondary | ICD-10-CM | POA: Diagnosis not present

## 2019-08-02 DIAGNOSIS — Q6689 Other  specified congenital deformities of feet: Secondary | ICD-10-CM | POA: Diagnosis not present

## 2019-08-02 DIAGNOSIS — L851 Acquired keratosis [keratoderma] palmaris et plantaris: Secondary | ICD-10-CM | POA: Diagnosis not present

## 2019-08-02 DIAGNOSIS — E118 Type 2 diabetes mellitus with unspecified complications: Secondary | ICD-10-CM | POA: Diagnosis not present

## 2019-08-11 DIAGNOSIS — H43813 Vitreous degeneration, bilateral: Secondary | ICD-10-CM | POA: Diagnosis not present

## 2019-09-14 DIAGNOSIS — M71341 Other bursal cyst, right hand: Secondary | ICD-10-CM | POA: Diagnosis not present

## 2019-09-16 DIAGNOSIS — M71341 Other bursal cyst, right hand: Secondary | ICD-10-CM | POA: Diagnosis not present

## 2019-10-07 DIAGNOSIS — Z23 Encounter for immunization: Secondary | ICD-10-CM | POA: Diagnosis not present

## 2019-11-01 DIAGNOSIS — Z124 Encounter for screening for malignant neoplasm of cervix: Secondary | ICD-10-CM | POA: Diagnosis not present

## 2019-11-01 DIAGNOSIS — K219 Gastro-esophageal reflux disease without esophagitis: Secondary | ICD-10-CM | POA: Diagnosis not present

## 2019-11-01 DIAGNOSIS — E871 Hypo-osmolality and hyponatremia: Secondary | ICD-10-CM | POA: Diagnosis not present

## 2019-11-01 DIAGNOSIS — I10 Essential (primary) hypertension: Secondary | ICD-10-CM | POA: Diagnosis not present

## 2019-11-01 DIAGNOSIS — G4451 Hemicrania continua: Secondary | ICD-10-CM | POA: Diagnosis not present

## 2019-11-01 DIAGNOSIS — H3530 Unspecified macular degeneration: Secondary | ICD-10-CM | POA: Diagnosis not present

## 2019-11-01 DIAGNOSIS — E782 Mixed hyperlipidemia: Secondary | ICD-10-CM | POA: Diagnosis not present

## 2019-11-01 DIAGNOSIS — R296 Repeated falls: Secondary | ICD-10-CM | POA: Diagnosis not present

## 2019-11-01 DIAGNOSIS — E039 Hypothyroidism, unspecified: Secondary | ICD-10-CM | POA: Diagnosis not present

## 2019-11-01 DIAGNOSIS — M79605 Pain in left leg: Secondary | ICD-10-CM | POA: Diagnosis not present

## 2019-11-01 DIAGNOSIS — E119 Type 2 diabetes mellitus without complications: Secondary | ICD-10-CM | POA: Diagnosis not present

## 2019-11-01 DIAGNOSIS — E1165 Type 2 diabetes mellitus with hyperglycemia: Secondary | ICD-10-CM | POA: Diagnosis not present

## 2019-11-05 DIAGNOSIS — Z23 Encounter for immunization: Secondary | ICD-10-CM | POA: Diagnosis not present

## 2019-11-09 DIAGNOSIS — H3530 Unspecified macular degeneration: Secondary | ICD-10-CM | POA: Diagnosis not present

## 2019-11-09 DIAGNOSIS — I10 Essential (primary) hypertension: Secondary | ICD-10-CM | POA: Diagnosis not present

## 2019-11-09 DIAGNOSIS — E782 Mixed hyperlipidemia: Secondary | ICD-10-CM | POA: Diagnosis not present

## 2019-11-09 DIAGNOSIS — E039 Hypothyroidism, unspecified: Secondary | ICD-10-CM | POA: Diagnosis not present

## 2019-11-09 DIAGNOSIS — E1165 Type 2 diabetes mellitus with hyperglycemia: Secondary | ICD-10-CM | POA: Diagnosis not present

## 2019-11-09 DIAGNOSIS — M71341 Other bursal cyst, right hand: Secondary | ICD-10-CM | POA: Diagnosis not present

## 2019-11-09 DIAGNOSIS — E871 Hypo-osmolality and hyponatremia: Secondary | ICD-10-CM | POA: Diagnosis not present

## 2019-11-09 DIAGNOSIS — K219 Gastro-esophageal reflux disease without esophagitis: Secondary | ICD-10-CM | POA: Diagnosis not present

## 2019-11-09 DIAGNOSIS — G44001 Cluster headache syndrome, unspecified, intractable: Secondary | ICD-10-CM | POA: Diagnosis not present

## 2019-11-15 DIAGNOSIS — M67441 Ganglion, right hand: Secondary | ICD-10-CM | POA: Diagnosis not present

## 2019-12-09 DIAGNOSIS — M67441 Ganglion, right hand: Secondary | ICD-10-CM | POA: Diagnosis not present

## 2019-12-20 DIAGNOSIS — M67441 Ganglion, right hand: Secondary | ICD-10-CM | POA: Diagnosis not present

## 2020-02-07 DIAGNOSIS — E118 Type 2 diabetes mellitus with unspecified complications: Secondary | ICD-10-CM | POA: Diagnosis not present

## 2020-02-07 DIAGNOSIS — Q6689 Other  specified congenital deformities of feet: Secondary | ICD-10-CM | POA: Diagnosis not present

## 2020-02-07 DIAGNOSIS — L851 Acquired keratosis [keratoderma] palmaris et plantaris: Secondary | ICD-10-CM | POA: Diagnosis not present

## 2020-02-16 ENCOUNTER — Other Ambulatory Visit (HOSPITAL_COMMUNITY): Payer: Self-pay | Admitting: Internal Medicine

## 2020-02-16 DIAGNOSIS — Z1231 Encounter for screening mammogram for malignant neoplasm of breast: Secondary | ICD-10-CM

## 2020-03-07 ENCOUNTER — Ambulatory Visit (HOSPITAL_COMMUNITY)
Admission: RE | Admit: 2020-03-07 | Discharge: 2020-03-07 | Disposition: A | Discharge status: Home / Self Care | Payer: Medicare Other | Source: Ambulatory Visit | Attending: Internal Medicine | Admitting: Internal Medicine

## 2020-03-07 DIAGNOSIS — Z1231 Encounter for screening mammogram for malignant neoplasm of breast: Secondary | ICD-10-CM | POA: Diagnosis not present

## 2020-03-28 DIAGNOSIS — I10 Essential (primary) hypertension: Secondary | ICD-10-CM | POA: Diagnosis not present

## 2020-04-03 DIAGNOSIS — M67441 Ganglion, right hand: Secondary | ICD-10-CM | POA: Diagnosis not present

## 2020-05-21 DIAGNOSIS — R2240 Localized swelling, mass and lump, unspecified lower limb: Secondary | ICD-10-CM | POA: Diagnosis not present

## 2020-06-08 IMAGING — CT CT SHOULDER*L* W/O CM
2 of 5 series · 12 of 27 positions shown, 14 images · non-contrast
Comparison: None.

CLINICAL DATA: Left shoulder injury.  Chronic left shoulder pain.

EXAM:
CT OF THE UPPER LEFT EXTREMITY WITHOUT CONTRAST
TECHNIQUE: Multidetector CT imaging of the upper left extremity was performed
according to the standard protocol.

[Series 16: shoulder 0.60 br60 s3 thins · axial · 0.57mm/px · z∈[-1057,-897]mm · 7 of 357 slices shown, 9 images]
[im 45/357  soft-tissue]
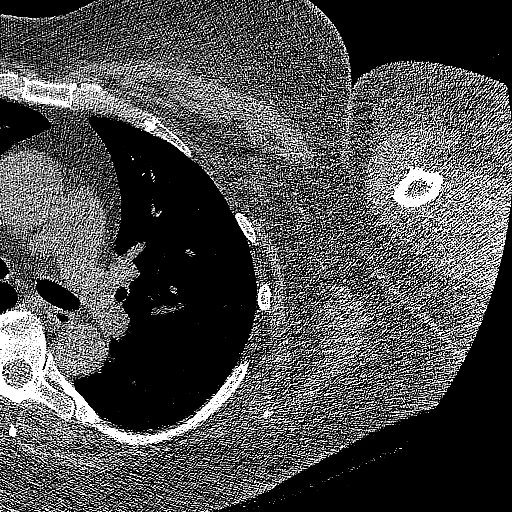
[im 45/357  bone]
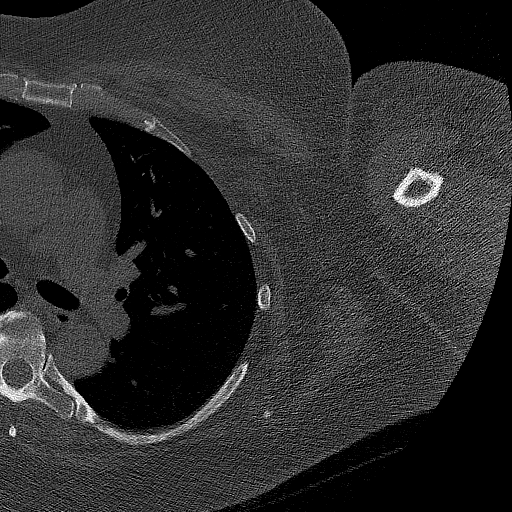
[im 90/357  bone]
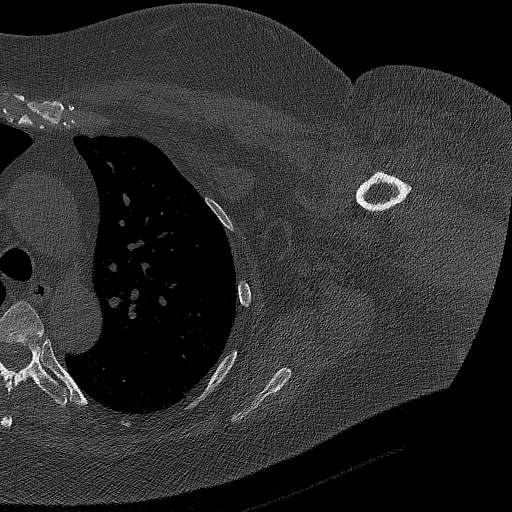
[im 134/357  bone]
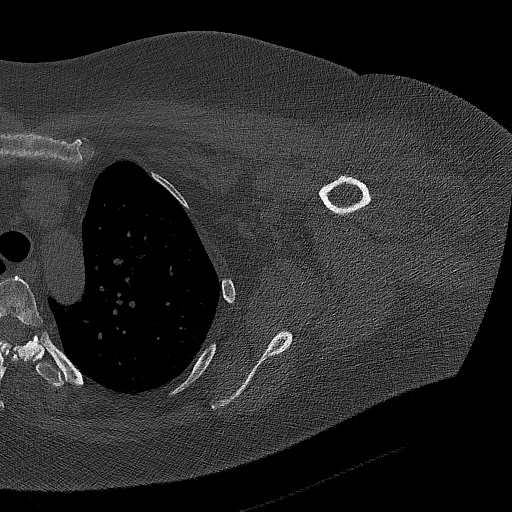
[im 179/357  bone]
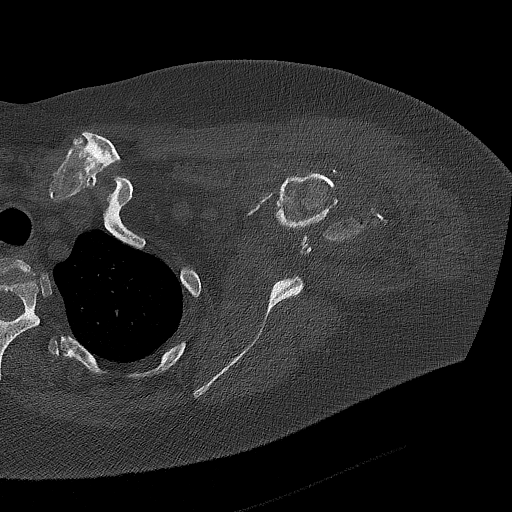
[im 223/357  soft-tissue]
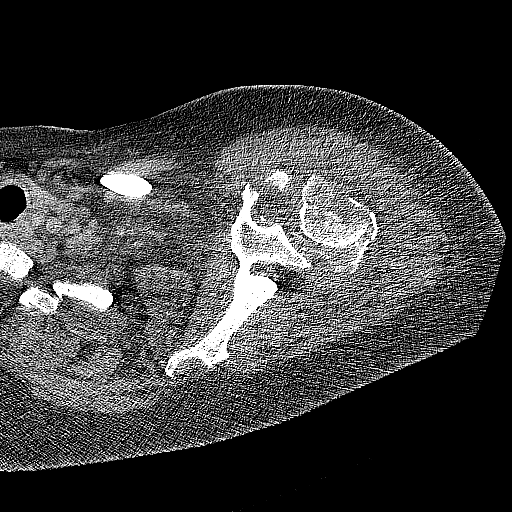
[im 223/357  bone]
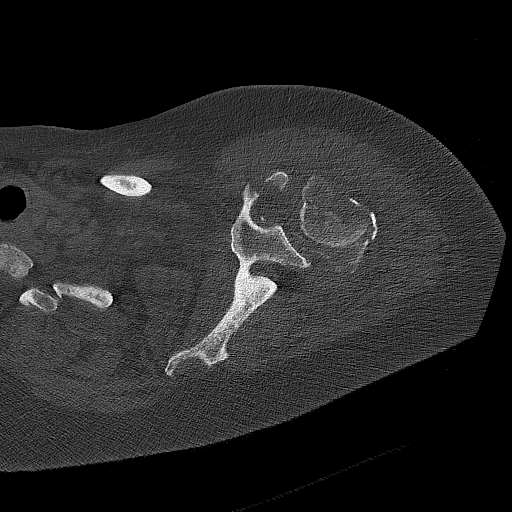
[im 268/357  bone]
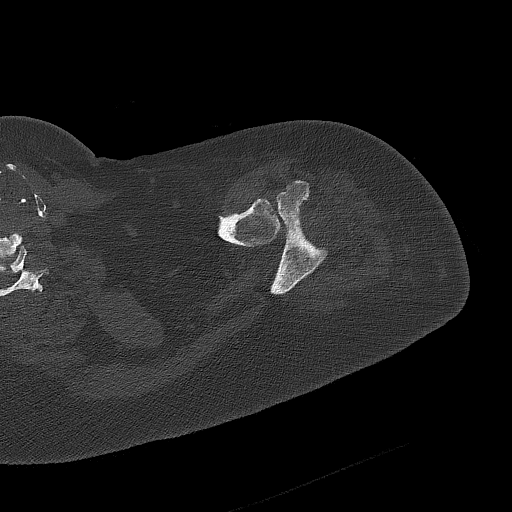
[im 312/357  bone]
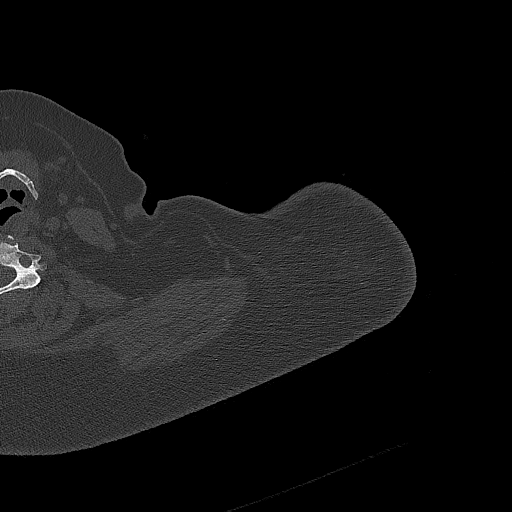

[Series 17: shoulder 2.00 br40 s3 sag · sagittal · 0.42mm/px · 5 of 125 slices shown]
[im 21/125  bone]
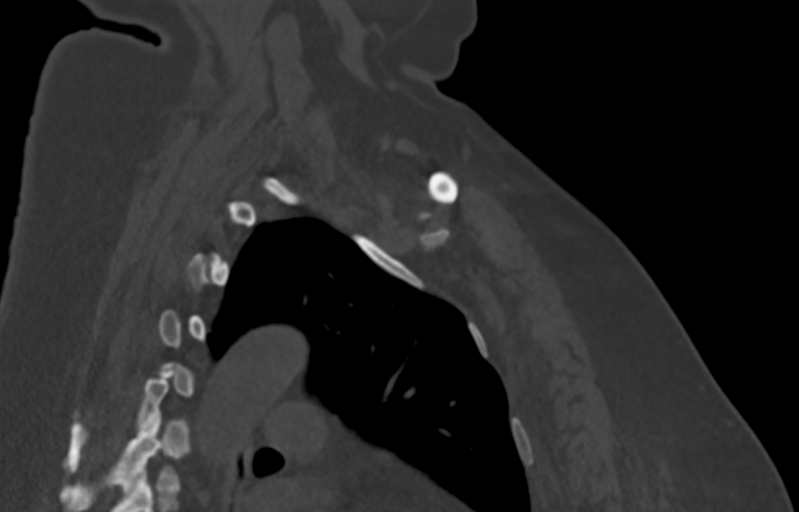
[im 42/125  bone]
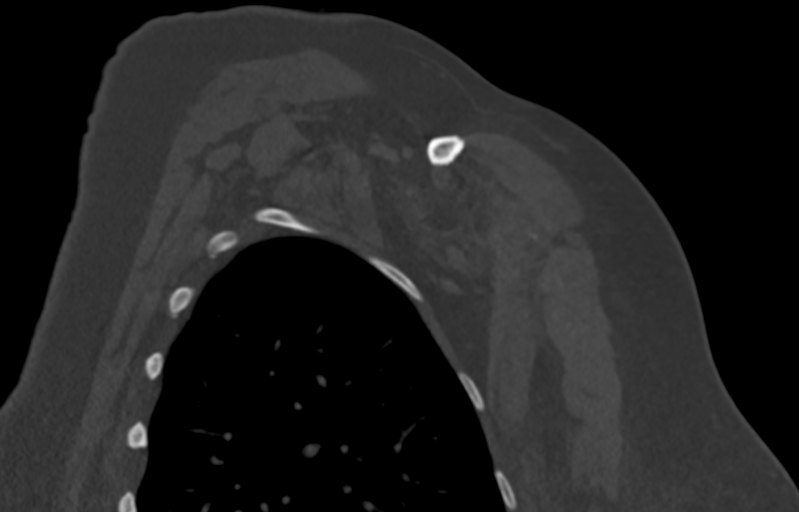
[im 63/125  bone]
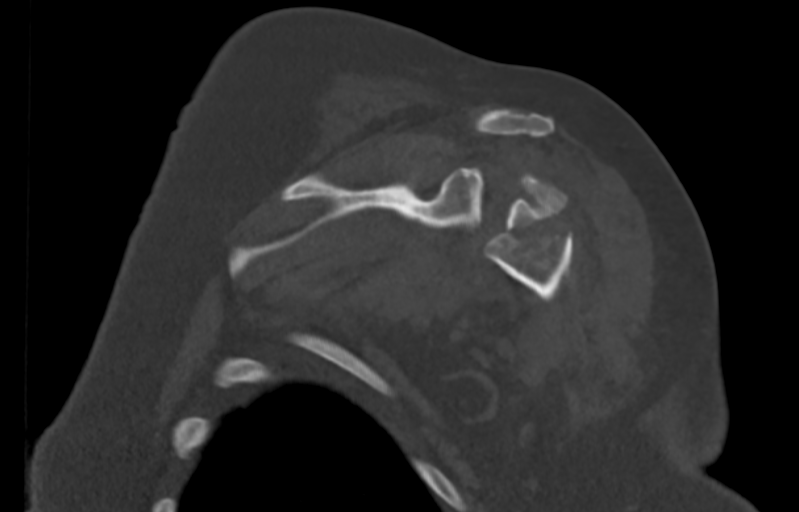
[im 83/125  bone]
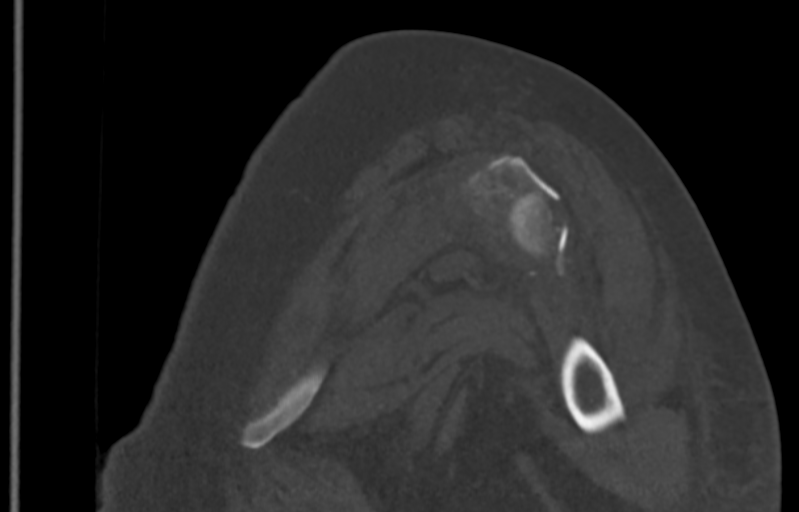
[im 104/125  bone]
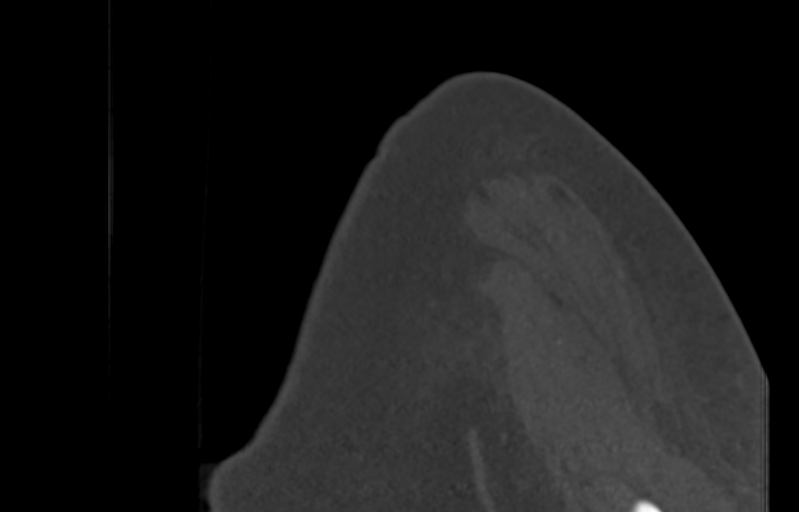

[12 of 27 positions shown; findings below may reference images not displayed]

FINDINGS: Bones/Joint/Cartilage

Severely comminuted fracture of the surgical neck of the left
proximal humerus with 2.3 cm of medial displacement and 1.6 cm of
anterior displacement. Fracture involves the greater tuberosity
which is displaced posteriorly by 2.3 cm. Small fracture fragments
in the glenohumeral joint with the largest measuring 9 mm.

Fracture of the coracoid process with 12 mm of displacement.

Mild arthropathy of the acromioclavicular joint. Small joint
effusion.

Ligaments

Ligaments are suboptimally evaluated by CT.

Muscles and Tendons
Muscles are normal.  No muscle atrophy.

Soft tissue
No fluid collection or hematoma. No soft tissue mass. Visualized
left lung is clear.
IMPRESSION: 1. Severely comminuted fracture of the surgical neck of the left
proximal humerus with 2.3 cm of medial displacement. Fracture
involves the greater tuberosity which is displaced posteriorly by
2.3 cm. Small fracture fragments in the glenohumeral joint with the
largest measuring 9 mm.
2. Fracture of the coracoid process with 12 mm of displacement.

## 2020-06-09 IMAGING — DX DG SHOULDER 1V*L*
1 series · 1 of 1 positions shown · non-contrast
Comparison: Intraoperative LEFT shoulder x-ray earlier same day at
[DATE] p.m.. LEFT shoulder x-rays 03/29/2018.

CLINICAL DATA: Postop day 0 LEFT shoulder arthroplasty for repair
of a severely comminuted LEFT humeral head and neck fracture.

EXAM:
Portable LEFT SHOULDER - 1 VIEW [DATE] p.m.:

[shoulder ap]
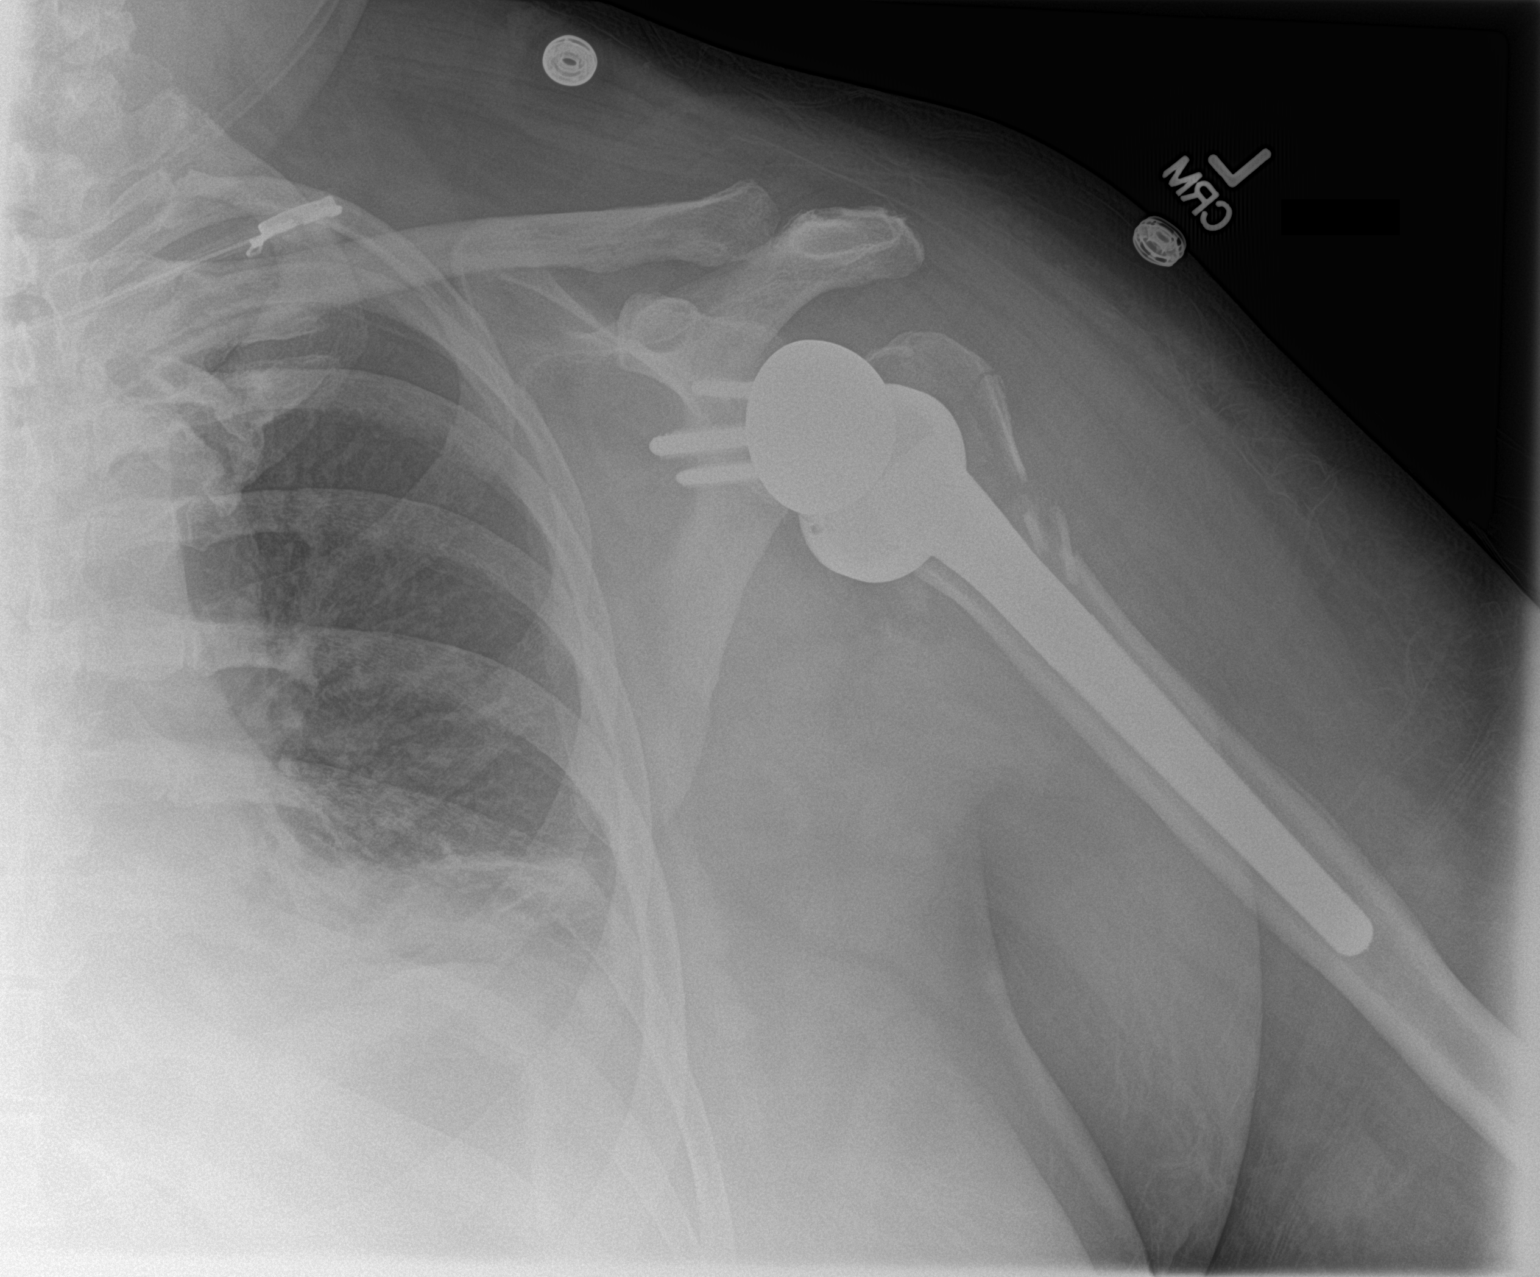

[1 of 1 positions shown; findings below may reference images not displayed]

FINDINGS: Anatomic alignment post reverse LEFT shoulder arthroplasty. No
complicating features. Fracture fragments from the prior severely
comminuted humeral head and neck fracture are present adjacent to
the articulating portion of the prosthesis, though the stem extends
well below these fracture fragments.
IMPRESSION: Anatomic alignment post reverse LEFT shoulder arthroplasty without
acute complicating features.

## 2020-06-18 DIAGNOSIS — I10 Essential (primary) hypertension: Secondary | ICD-10-CM | POA: Diagnosis not present

## 2020-06-18 DIAGNOSIS — R2240 Localized swelling, mass and lump, unspecified lower limb: Secondary | ICD-10-CM | POA: Diagnosis not present

## 2020-06-28 DIAGNOSIS — Z23 Encounter for immunization: Secondary | ICD-10-CM | POA: Diagnosis not present

## 2020-07-06 DIAGNOSIS — H353112 Nonexudative age-related macular degeneration, right eye, intermediate dry stage: Secondary | ICD-10-CM | POA: Diagnosis not present

## 2020-07-06 DIAGNOSIS — M25551 Pain in right hip: Secondary | ICD-10-CM | POA: Diagnosis not present

## 2020-07-06 DIAGNOSIS — H353121 Nonexudative age-related macular degeneration, left eye, early dry stage: Secondary | ICD-10-CM | POA: Diagnosis not present

## 2020-07-06 DIAGNOSIS — H2513 Age-related nuclear cataract, bilateral: Secondary | ICD-10-CM | POA: Diagnosis not present

## 2020-07-06 DIAGNOSIS — M545 Low back pain, unspecified: Secondary | ICD-10-CM | POA: Diagnosis not present

## 2020-07-06 DIAGNOSIS — H04123 Dry eye syndrome of bilateral lacrimal glands: Secondary | ICD-10-CM | POA: Diagnosis not present

## 2020-07-06 DIAGNOSIS — L989 Disorder of the skin and subcutaneous tissue, unspecified: Secondary | ICD-10-CM | POA: Diagnosis not present

## 2020-07-06 DIAGNOSIS — M25552 Pain in left hip: Secondary | ICD-10-CM | POA: Diagnosis not present

## 2020-07-11 DIAGNOSIS — M2550 Pain in unspecified joint: Secondary | ICD-10-CM | POA: Diagnosis not present

## 2020-07-11 DIAGNOSIS — R5383 Other fatigue: Secondary | ICD-10-CM | POA: Diagnosis not present

## 2020-07-11 DIAGNOSIS — M8580 Other specified disorders of bone density and structure, unspecified site: Secondary | ICD-10-CM | POA: Diagnosis not present

## 2020-07-11 DIAGNOSIS — M545 Low back pain, unspecified: Secondary | ICD-10-CM | POA: Diagnosis not present

## 2020-07-11 DIAGNOSIS — E559 Vitamin D deficiency, unspecified: Secondary | ICD-10-CM | POA: Diagnosis not present

## 2020-07-11 DIAGNOSIS — M1611 Unilateral primary osteoarthritis, right hip: Secondary | ICD-10-CM | POA: Diagnosis not present

## 2020-07-17 DIAGNOSIS — M1611 Unilateral primary osteoarthritis, right hip: Secondary | ICD-10-CM | POA: Diagnosis not present

## 2020-07-18 DIAGNOSIS — L821 Other seborrheic keratosis: Secondary | ICD-10-CM | POA: Diagnosis not present

## 2020-07-18 DIAGNOSIS — D225 Melanocytic nevi of trunk: Secondary | ICD-10-CM | POA: Diagnosis not present

## 2020-08-01 DIAGNOSIS — H3530 Unspecified macular degeneration: Secondary | ICD-10-CM | POA: Diagnosis not present

## 2020-08-01 DIAGNOSIS — R296 Repeated falls: Secondary | ICD-10-CM | POA: Diagnosis not present

## 2020-08-01 DIAGNOSIS — I10 Essential (primary) hypertension: Secondary | ICD-10-CM | POA: Diagnosis not present

## 2020-08-01 DIAGNOSIS — M79605 Pain in left leg: Secondary | ICD-10-CM | POA: Diagnosis not present

## 2020-08-01 DIAGNOSIS — Z124 Encounter for screening for malignant neoplasm of cervix: Secondary | ICD-10-CM | POA: Diagnosis not present

## 2020-08-01 DIAGNOSIS — E782 Mixed hyperlipidemia: Secondary | ICD-10-CM | POA: Diagnosis not present

## 2020-08-01 DIAGNOSIS — E039 Hypothyroidism, unspecified: Secondary | ICD-10-CM | POA: Diagnosis not present

## 2020-08-01 DIAGNOSIS — E871 Hypo-osmolality and hyponatremia: Secondary | ICD-10-CM | POA: Diagnosis not present

## 2020-08-01 DIAGNOSIS — E119 Type 2 diabetes mellitus without complications: Secondary | ICD-10-CM | POA: Diagnosis not present

## 2020-08-01 DIAGNOSIS — E1165 Type 2 diabetes mellitus with hyperglycemia: Secondary | ICD-10-CM | POA: Diagnosis not present

## 2020-08-01 DIAGNOSIS — K219 Gastro-esophageal reflux disease without esophagitis: Secondary | ICD-10-CM | POA: Diagnosis not present

## 2020-08-01 DIAGNOSIS — G4451 Hemicrania continua: Secondary | ICD-10-CM | POA: Diagnosis not present

## 2020-08-06 DIAGNOSIS — G44001 Cluster headache syndrome, unspecified, intractable: Secondary | ICD-10-CM | POA: Diagnosis not present

## 2020-08-06 DIAGNOSIS — H3530 Unspecified macular degeneration: Secondary | ICD-10-CM | POA: Diagnosis not present

## 2020-08-06 DIAGNOSIS — E871 Hypo-osmolality and hyponatremia: Secondary | ICD-10-CM | POA: Diagnosis not present

## 2020-08-06 DIAGNOSIS — E039 Hypothyroidism, unspecified: Secondary | ICD-10-CM | POA: Diagnosis not present

## 2020-08-06 DIAGNOSIS — I10 Essential (primary) hypertension: Secondary | ICD-10-CM | POA: Diagnosis not present

## 2020-08-06 DIAGNOSIS — R760 Raised antibody titer: Secondary | ICD-10-CM | POA: Diagnosis not present

## 2020-08-06 DIAGNOSIS — D649 Anemia, unspecified: Secondary | ICD-10-CM | POA: Diagnosis not present

## 2020-08-06 DIAGNOSIS — Z6841 Body Mass Index (BMI) 40.0 and over, adult: Secondary | ICD-10-CM | POA: Diagnosis not present

## 2020-08-06 DIAGNOSIS — K219 Gastro-esophageal reflux disease without esophagitis: Secondary | ICD-10-CM | POA: Diagnosis not present

## 2020-08-06 DIAGNOSIS — E1165 Type 2 diabetes mellitus with hyperglycemia: Secondary | ICD-10-CM | POA: Diagnosis not present

## 2020-08-06 DIAGNOSIS — E782 Mixed hyperlipidemia: Secondary | ICD-10-CM | POA: Diagnosis not present

## 2020-08-07 DIAGNOSIS — E118 Type 2 diabetes mellitus with unspecified complications: Secondary | ICD-10-CM | POA: Diagnosis not present

## 2020-08-07 DIAGNOSIS — Q6689 Other  specified congenital deformities of feet: Secondary | ICD-10-CM | POA: Diagnosis not present

## 2020-09-13 DIAGNOSIS — M160 Bilateral primary osteoarthritis of hip: Secondary | ICD-10-CM | POA: Diagnosis not present

## 2020-09-13 DIAGNOSIS — M47816 Spondylosis without myelopathy or radiculopathy, lumbar region: Secondary | ICD-10-CM | POA: Diagnosis not present

## 2020-09-19 ENCOUNTER — Other Ambulatory Visit: Payer: Self-pay

## 2020-09-19 ENCOUNTER — Encounter: Payer: Self-pay | Admitting: Nurse Practitioner

## 2020-09-19 ENCOUNTER — Ambulatory Visit (INDEPENDENT_AMBULATORY_CARE_PROVIDER_SITE_OTHER): Payer: Medicare Other | Admitting: Nurse Practitioner

## 2020-09-19 VITALS — BP 155/85 | HR 73 | Resp 15 | Ht 68.0 in | Wt 264.8 lb

## 2020-09-19 DIAGNOSIS — E785 Hyperlipidemia, unspecified: Secondary | ICD-10-CM | POA: Diagnosis not present

## 2020-09-19 DIAGNOSIS — K219 Gastro-esophageal reflux disease without esophagitis: Secondary | ICD-10-CM

## 2020-09-19 DIAGNOSIS — H3530 Unspecified macular degeneration: Secondary | ICD-10-CM

## 2020-09-19 DIAGNOSIS — E1165 Type 2 diabetes mellitus with hyperglycemia: Secondary | ICD-10-CM | POA: Insufficient documentation

## 2020-09-19 DIAGNOSIS — Z139 Encounter for screening, unspecified: Secondary | ICD-10-CM | POA: Diagnosis not present

## 2020-09-19 DIAGNOSIS — Z803 Family history of malignant neoplasm of breast: Secondary | ICD-10-CM

## 2020-09-19 DIAGNOSIS — G8929 Other chronic pain: Secondary | ICD-10-CM

## 2020-09-19 DIAGNOSIS — E039 Hypothyroidism, unspecified: Secondary | ICD-10-CM

## 2020-09-19 DIAGNOSIS — E1169 Type 2 diabetes mellitus with other specified complication: Secondary | ICD-10-CM | POA: Insufficient documentation

## 2020-09-19 DIAGNOSIS — I10 Essential (primary) hypertension: Secondary | ICD-10-CM

## 2020-09-19 DIAGNOSIS — E782 Mixed hyperlipidemia: Secondary | ICD-10-CM | POA: Insufficient documentation

## 2020-09-19 MED ORDER — GLIPIZIDE ER 5 MG PO TB24: 5.0000 mg | ORAL_TABLET | Freq: Every day | ORAL | 1 refills | Status: DC
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
## 2020-09-19 NOTE — Assessment & Plan Note (Addendum)
-  last mammogram was summer 2021 -has 3 sisters that had breast cancer -she would like breast exam at next visit

## 2020-09-19 NOTE — Assessment & Plan Note (Addendum)
-  no issues today -Takes telmisartan 40 mg daily -Takes HCTZ 12.5 mg daily

## 2020-09-19 NOTE — Assessment & Plan Note (Addendum)
-  no A1c to review today -takes metformin 500 mg BID -was sampled Jardiance previously, but this is too expensive for her; STOP jardiance -Rx. glipizide

## 2020-09-19 NOTE — Assessment & Plan Note (Signed)
-  followed by Dr. Manuella Ghazi -will request medical records

## 2020-09-19 NOTE — Assessment & Plan Note (Signed)
-  no issues today -takes omeprazole 40 mg daily

## 2020-09-19 NOTE — Patient Instructions (Signed)
It was great meeting you today.  For your blood sugar, we are stopping Jardiance due to expense.  We are starting glipizide XL 5 mg daily.  Please take this in the morning with breakfast.  Also, please start checking your blood sugar at least once per day as this medication can cause low blood sugar.  You were referred to Dr. Laural Golden for follow-up colonoscopy.

## 2020-09-19 NOTE — Assessment & Plan Note (Signed)
-  has ongoing low back pain as well as bilateral hip pain -we referred to Dr. Layne Benton with [facility] At that time (Nov 2021), her ANA was positive.  Additionally, her spinal T-score on bone density scan was +8.9, and she screened negative for Paget's disease of the bone. She was recommended to see rheumatology at [facility] (possibly [facility]). She was referred to PT for eval and treatment of her back pain. -states she may not get to see PT d/t COVID protocols -Dr. Kathrin Penner office is handling the rheumatology referral

## 2020-09-19 NOTE — Assessment & Plan Note (Signed)
-  no thyroid labs to review today -takes levothyroxine 150 mcg daily

## 2020-09-19 NOTE — Assessment & Plan Note (Signed)
-  no labs to review today -she was taking pravastatin in the past, but hasn't taken that recently

## 2020-09-20 ENCOUNTER — Encounter (INDEPENDENT_AMBULATORY_CARE_PROVIDER_SITE_OTHER): Payer: Self-pay | Admitting: *Deleted

## 2020-09-25 ENCOUNTER — Other Ambulatory Visit: Payer: Self-pay

## 2020-09-25 DIAGNOSIS — E785 Hyperlipidemia, unspecified: Secondary | ICD-10-CM

## 2020-09-25 MED ORDER — METFORMIN HCL 500 MG PO TABS: 1500.0000 mg | ORAL_TABLET | Freq: Three times a day (TID) | ORAL | 1 refills | Status: DC

## 2020-09-25 MED ORDER — AMLODIPINE BESYLATE 5 MG PO TABS: 5.0000 mg | ORAL_TABLET | Freq: Every day | ORAL | 3 refills | Status: DC

## 2020-09-27 ENCOUNTER — Ambulatory Visit (INDEPENDENT_AMBULATORY_CARE_PROVIDER_SITE_OTHER): Payer: Medicare Other

## 2020-09-27 ENCOUNTER — Other Ambulatory Visit: Payer: Self-pay

## 2020-09-27 VITALS — Ht 68.0 in | Wt 265.0 lb

## 2020-09-27 DIAGNOSIS — Z0000 Encounter for general adult medical examination without abnormal findings: Secondary | ICD-10-CM

## 2020-09-27 NOTE — Patient Instructions (Signed)
Julie Bean , Thank you for taking time to come for your Medicare Wellness Visit. I appreciate your ongoing commitment to your health goals. Please review the following plan we discussed and let me know if I can assist you in the future.   Screening recommendations/referrals: Colonoscopy: Not due..will call to schedule. Mammogram: 03/07/22 Bone Density: Complete Recommended yearly ophthalmology/optometry visit for glaucoma screening and checkup Recommended yearly dental visit for hygiene and checkup  Vaccinations: Influenza vaccine: Fall 2022 Pneumococcal vaccine: Declined at this time Tdap vaccine: Declined at this time Shingles vaccine: Declined at this time    Advanced directives: POA and Living will  Conditions/risks identified: None  Next appointment: 11/20/20 @ 9 am   Preventive Care 65 Years and Older, Female Preventive care refers to lifestyle choices and visits with your health care provider that can promote health and wellness. What does preventive care include?  A yearly physical exam. This is also called an annual well check.  Dental exams once or twice a year.  Routine eye exams. Ask your health care provider how often you should have your eyes checked.  Personal lifestyle choices, including:  Daily care of your teeth and gums.  Regular physical activity.  Eating a healthy diet.  Avoiding tobacco and drug use.  Limiting alcohol use.  Practicing safe sex.  Taking low-dose aspirin every day.  Taking vitamin and mineral supplements as recommended by your health care provider. What happens during an annual well check? The services and screenings done by your health care provider during your annual well check will depend on your age, overall health, lifestyle risk factors, and family history of disease. Counseling  Your health care provider may ask you questions about your:  Alcohol use.  Tobacco use.  Drug use.  Emotional well-being.  Home and  relationship well-being.  Sexual activity.  Eating habits.  History of falls.  Memory and ability to understand (cognition).  Work and work Statistician.  Reproductive health. Screening  You may have the following tests or measurements:  Height, weight, and BMI.  Blood pressure.  Lipid and cholesterol levels. These may be checked every 5 years, or more frequently if you are over 40 years old.  Skin check.  Lung cancer screening. You may have this screening every year starting at age 55 if you have a 30-pack-year history of smoking and currently smoke or have quit within the past 15 years.  Fecal occult blood test (FOBT) of the stool. You may have this test every year starting at age 43.  Flexible sigmoidoscopy or colonoscopy. You may have a sigmoidoscopy every 5 years or a colonoscopy every 10 years starting at age 72.  Hepatitis C blood test.  Hepatitis B blood test.  Sexually transmitted disease (STD) testing.  Diabetes screening. This is done by checking your blood sugar (glucose) after you have not eaten for a while (fasting). You may have this done every 1-3 years.  Bone density scan. This is done to screen for osteoporosis. You may have this done starting at age 39.  Mammogram. This may be done every 1-2 years. Talk to your health care provider about how often you should have regular mammograms. Talk with your health care provider about your test results, treatment options, and if necessary, the need for more tests. Vaccines  Your health care provider may recommend certain vaccines, such as:  Influenza vaccine. This is recommended every year.  Tetanus, diphtheria, and acellular pertussis (Tdap, Td) vaccine. You may need a Td booster  every 10 years.  Zoster vaccine. You may need this after age 15.  Pneumococcal 13-valent conjugate (PCV13) vaccine. One dose is recommended after age 105.  Pneumococcal polysaccharide (PPSV23) vaccine. One dose is recommended after  age 44. Talk to your health care provider about which screenings and vaccines you need and how often you need them. This information is not intended to replace advice given to you by your health care provider. Make sure you discuss any questions you have with your health care provider. Document Released: 09/14/2015 Document Revised: 05/07/2016 Document Reviewed: 06/19/2015 Elsevier Interactive Patient Education  2017 [place] Prevention in the Home Falls can cause injuries. They can happen to people of all ages. There are many things you can do to make your home safe and to help prevent falls. What can I do on the outside of my home?  Regularly fix the edges of walkways and driveways and fix any cracks.  Remove anything that might make you trip as you walk through a door, such as a raised step or threshold.  Trim any bushes or trees on the path to your home.  Use bright outdoor lighting.  Clear any walking paths of anything that might make someone trip, such as rocks or tools.  Regularly check to see if handrails are loose or broken. Make sure that both sides of any steps have handrails.  Any raised decks and porches should have guardrails on the edges.  Have any leaves, snow, or ice cleared regularly.  Use sand or salt on walking paths during winter.  Clean up any spills in your garage right away. This includes oil or grease spills. What can I do in the bathroom?  Use night lights.  Install grab bars by the toilet and in the tub and shower. Do not use towel bars as grab bars.  Use non-skid mats or decals in the tub or shower.  If you need to sit down in the shower, use a plastic, non-slip stool.  Keep the floor dry. Clean up any water that spills on the floor as soon as it happens.  Remove soap buildup in the tub or shower regularly.  Attach bath mats securely with double-sided non-slip rug tape.  Do not have throw rugs and other things on the floor that can  make you trip. What can I do in the bedroom?  Use night lights.  Make sure that you have a light by your bed that is easy to reach.  Do not use any sheets or blankets that are too big for your bed. They should not hang down onto the floor.  Have a firm chair that has side arms. You can use this for support while you get dressed.  Do not have throw rugs and other things on the floor that can make you trip. What can I do in the kitchen?  Clean up any spills right away.  Avoid walking on wet floors.  Keep items that you use a lot in easy-to-reach places.  If you need to reach something above you, use a strong step stool that has a grab bar.  Keep electrical cords out of the way.  Do not use floor polish or wax that makes floors slippery. If you must use wax, use non-skid floor wax.  Do not have throw rugs and other things on the floor that can make you trip. What can I do with my stairs?  Do not leave any items on the stairs.  Make  sure that there are handrails on both sides of the stairs and use them. Fix handrails that are broken or loose. Make sure that handrails are as long as the stairways.  Check any carpeting to make sure that it is firmly attached to the stairs. Fix any carpet that is loose or worn.  Avoid having throw rugs at the top or bottom of the stairs. If you do have throw rugs, attach them to the floor with carpet tape.  Make sure that you have a light switch at the top of the stairs and the bottom of the stairs. If you do not have them, ask someone to add them for you. What else can I do to help prevent falls?  Wear shoes that:  Do not have high heels.  Have rubber bottoms.  Are comfortable and fit you well.  Are closed at the toe. Do not wear sandals.  If you use a stepladder:  Make sure that it is fully opened. Do not climb a closed stepladder.  Make sure that both sides of the stepladder are locked into place.  Ask someone to hold it for you,  if possible.  Clearly mark and make sure that you can see:  Any grab bars or handrails.  First and last steps.  Where the edge of each step is.  Use tools that help you move around (mobility aids) if they are needed. These include:  Canes.  Walkers.  Scooters.  Crutches.  Turn on the lights when you go into a dark area. Replace any light bulbs as soon as they burn out.  Set up your furniture so you have a clear path. Avoid moving your furniture around.  If any of your floors are uneven, fix them.  If there are any pets around you, be aware of where they are.  Review your medicines with your doctor. Some medicines can make you feel dizzy. This can increase your chance of falling. Ask your doctor what other things that you can do to help prevent falls. This information is not intended to replace advice given to you by your health care provider. Make sure you discuss any questions you have with your health care provider. Document Released: 06/14/2009 Document Revised: 01/24/2016 Document Reviewed: 09/22/2014 Elsevier Interactive Patient Education  2017 [employer].
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
## 2020-09-28 DIAGNOSIS — R768 Other specified abnormal immunological findings in serum: Secondary | ICD-10-CM | POA: Insufficient documentation

## 2020-09-28 DIAGNOSIS — M4810 Ankylosing hyperostosis [Forestier], site unspecified: Secondary | ICD-10-CM | POA: Diagnosis not present

## 2020-10-01 ENCOUNTER — Ambulatory Visit: Payer: Medicare Other | Admitting: Nutrition

## 2020-10-01 ENCOUNTER — Encounter: Payer: Medicare Other | Attending: Internal Medicine | Admitting: Nutrition

## 2020-10-01 VITALS — Ht 68.0 in | Wt 265.0 lb

## 2020-10-01 DIAGNOSIS — E782 Mixed hyperlipidemia: Secondary | ICD-10-CM | POA: Insufficient documentation

## 2020-10-01 DIAGNOSIS — I10 Essential (primary) hypertension: Secondary | ICD-10-CM

## 2020-10-01 DIAGNOSIS — E1165 Type 2 diabetes mellitus with hyperglycemia: Secondary | ICD-10-CM | POA: Insufficient documentation

## 2020-10-01 NOTE — Patient Instructions (Signed)
Goals set by patient   Pay closer of nutrients at each meals. Track blood sugars twice a day as discussed. Increase lower carb vegetables, and fruit. Goals is FBS less than 130  And less than 150 at bedtime. Prevent low blood sugars.

## 2020-10-01 NOTE — Progress Notes (Signed)
Medical Nutrition Therapy  Appointment Start time: 1300   Appointment End time:  1400 Primary concerns today:Diabetes Type 2  Referral diagnosis: E11.8, E66.9 Preferred learning style: no preference indicated Learning readiness:  ready  NUTRITION ASSESSMENT   Anthropometrics  Wt Readings from Last 3 Encounters:  09/27/20 265 lb (120.2 kg)  09/19/20 264 lb 12.8 oz (120.1 kg)  04/07/18 290 lb (131.5 kg)   Ht Readings from Last 3 Encounters:  09/27/20 5\' 8"  (1.727 m)  09/19/20 5\' 8"  (1.727 m)  04/07/18 5\' 9"  (1.753 m)   There is no height or weight on file to calculate BMI. @BMIFA @ Facility age limit for growth percentiles is 20 years. Facility age limit for growth percentiles is 20 years.   Clinical Medical Hx: Medications: Metformin 500 mg TID, Glipizide 5 mg once a day,  Labs: Last December 9.1% CMP Latest Ref Rng & Units 04/02/2018  Glucose 70 - 99 mg/dL 147(H)  BUN 8 - 23 mg/dL 36(H)  Creatinine 0.44 - 1.00 mg/dL 1.01(H)  Sodium 135 - 145 mmol/L 132(L)  Potassium 3.5 - 5.1 mmol/L 4.7  Chloride 98 - 111 mmol/L 94(L)  CO2 22 - 32 mmol/L 23  Calcium 8.9 - 10.3 mg/dL 8.5(L)   FBS range 125-162 mg/dl.  HS: hasn't been testing at night. Notable Signs/Symptoms: none Lifestyle & Dietary Hx Recently diagnosis:  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 Will start physical therapy next week.  Uses APP for Calorie KIng. Estimated daily fluid intake: 5-6 14 oz of water with ice  Supplements: Eye supplement  Sleep: 6-8 hrs sleeps in recliner Stress / self-care: none other DISH and arthritis issues. Current average weekly physical activity: ADL  24-Hr Dietary Recall First Meal: 2 slices ww bread, 2 eggs, 1/4 c shredded cheese, 1/2 tbps butter, unsweet tea sack: Second Meal: prepared chicken salad from [employer] 1/2 c, 4 saltine crackers,  Snack: Blood sugar dropped; shaky- 1 cup lemonade  Third Meal: [place] garlic toast, serving spaghetti sauce, (14 g CHO), with ground  round meat, thin spaghetti 3/4 c, cheese sprinkle 1/2 svg, water Snack:  Cheddar cheese cabot - 1 oz. And 4 crackers.  Beverages: water and unsweet tea  Estimated Energy Needs Calories: 1200 Carbohydrate: 135g Protein: 90g Fat: 33g   NUTRITION DIAGNOSIS  NB-1.1 Food and nutrition-related knowledge deficit As related to diabetes.  As evidenced by A1C 9.1% NUTRITION INTERVENTION  Nutrition education (E-1) on the following topics:  . Nutrition and Diabetes education provided on My Plate, CHO counting, meal planning, portion sizes, timing of meals, avoiding snacks between meals unless having a low blood sugar, target ranges for A1C and blood sugars, signs/symptoms and treatment of hyper/hypoglycemia, monitoring blood sugars, taking medications as prescribed, benefits of exercising 30 minutes per day and prevention of complications of DM. Marland Kitchen   Handouts Provided Include   Emailed My Plate, diabetes instructions, heart healthy food choices, weight loss tips, meal plans, carb counting for diabetics.  Learning Style & Readiness for Change Teaching method utilized: Visual & Auditory  Demonstrated degree of understanding via: Teach Back  Barriers to learning/adherence to lifestyle change: back issues   Goals Established by Pt Goals set by patient   Pay closer of nutrients at each meals. Track blood sugars twice a day as discussed. Increase lower carb vegetables, and fruit. Goals is FBS less than 130  And less than 150 at bedtime. Prevent low blood sugars. Marland Kitchen    MONITORING & EVALUATION Dietary intake, weekly physical activity, and blood sugars  in  1 month..  Next Steps  Patient is to keep food journal and use app to log foods.Marland Kitchen

## 2020-10-09 ENCOUNTER — Encounter (HOSPITAL_COMMUNITY): Payer: Self-pay | Admitting: Physical Therapy

## 2020-10-09 ENCOUNTER — Ambulatory Visit (HOSPITAL_COMMUNITY): Payer: Medicare Other | Attending: Sports Medicine | Admitting: Physical Therapy

## 2020-10-09 ENCOUNTER — Other Ambulatory Visit: Payer: Self-pay

## 2020-10-09 DIAGNOSIS — M25552 Pain in left hip: Secondary | ICD-10-CM | POA: Diagnosis not present

## 2020-10-09 DIAGNOSIS — M25551 Pain in right hip: Secondary | ICD-10-CM | POA: Insufficient documentation

## 2020-10-09 DIAGNOSIS — M545 Low back pain, unspecified: Secondary | ICD-10-CM | POA: Insufficient documentation
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
## 2020-10-09 NOTE — Patient Instructions (Signed)
Access Code: [email or web address] URL: [email or web address] Date: 10/09/2020 Prepared by: Josue Hector  Exercises Seated Transversus Abdominis Bracing - 2-3 x daily - 7 x weekly - 2 sets - 10 reps - 5 seconds hold Seated Sidebending - 2-3 x daily - 7 x weekly - 1 sets - 10 reps - 5-10 seconds hold

## 2020-10-11 DIAGNOSIS — M47816 Spondylosis without myelopathy or radiculopathy, lumbar region: Secondary | ICD-10-CM | POA: Diagnosis not present

## 2020-10-11 DIAGNOSIS — M160 Bilateral primary osteoarthritis of hip: Secondary | ICD-10-CM | POA: Diagnosis not present

## 2020-10-15 ENCOUNTER — Ambulatory Visit (HOSPITAL_COMMUNITY): Payer: Medicare Other | Admitting: Physical Therapy

## 2020-10-15 ENCOUNTER — Other Ambulatory Visit: Payer: Self-pay

## 2020-10-15 DIAGNOSIS — M545 Low back pain, unspecified: Secondary | ICD-10-CM | POA: Diagnosis not present

## 2020-10-15 DIAGNOSIS — M25552 Pain in left hip: Secondary | ICD-10-CM | POA: Diagnosis not present

## 2020-10-15 DIAGNOSIS — M25551 Pain in right hip: Secondary | ICD-10-CM

## 2020-10-15 NOTE — Patient Instructions (Signed)
Bridge    Lie back, legs bent. Inhale, pressing hips up. Keeping ribs in, lengthen lower back. Exhale, rolling down along spine from top.   Abduction: Clam (Eccentric) - Side-Lying    Lie on side with knees bent. Lift top knee, keeping feet together. Keep trunk steady. Hold for 3-5 seconds   Heel Squeeze (Prone)    Abdomen supported, bend knees and gently squeeze heels together. Hold __5__ seconds.    Functional Quadriceps: Sit to Stand    Sit on edge of chair, feet flat on floor. Push from your  Buttocks.  Stand upright, extending knees fully.
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
## 2020-10-18 ENCOUNTER — Encounter (HOSPITAL_COMMUNITY): Payer: Self-pay | Admitting: Physical Therapy

## 2020-10-18 ENCOUNTER — Ambulatory Visit (HOSPITAL_COMMUNITY): Payer: Medicare Other | Admitting: Physical Therapy

## 2020-10-18 ENCOUNTER — Other Ambulatory Visit: Payer: Self-pay

## 2020-10-18 DIAGNOSIS — M25552 Pain in left hip: Secondary | ICD-10-CM

## 2020-10-18 DIAGNOSIS — M545 Low back pain, unspecified: Secondary | ICD-10-CM

## 2020-10-18 DIAGNOSIS — M25551 Pain in right hip: Secondary | ICD-10-CM | POA: Diagnosis not present
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
## 2020-10-18 NOTE — Patient Instructions (Signed)
Access Code: [email or web address] URL: [email or web address] Date: 10/18/2020 Prepared by: Josue Hector  Exercises Standing Knee Flexion Stretch on Step - 2 x daily - 7 x weekly - 1 sets - 5 reps - 10 second hold Heel Toe Raises with Counter Support - 2 x daily - 7 x weekly - 2 sets - 10 reps

## 2020-10-22 ENCOUNTER — Other Ambulatory Visit: Payer: Self-pay

## 2020-10-22 ENCOUNTER — Ambulatory Visit (HOSPITAL_COMMUNITY): Payer: Medicare Other | Admitting: Physical Therapy

## 2020-10-22 ENCOUNTER — Encounter: Payer: Self-pay | Admitting: Nutrition

## 2020-10-22 DIAGNOSIS — M25551 Pain in right hip: Secondary | ICD-10-CM | POA: Diagnosis not present

## 2020-10-22 DIAGNOSIS — M25552 Pain in left hip: Secondary | ICD-10-CM

## 2020-10-22 DIAGNOSIS — M545 Low back pain, unspecified: Secondary | ICD-10-CM
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
## 2020-10-23 ENCOUNTER — Other Ambulatory Visit: Payer: Self-pay | Admitting: Nurse Practitioner

## 2020-10-23 ENCOUNTER — Telehealth: Payer: Self-pay | Admitting: Nutrition

## 2020-10-23 MED ORDER — GLIPIZIDE ER 2.5 MG PO TB24: 2.5000 mg | ORAL_TABLET | Freq: Every day | ORAL | 1 refills | Status: DC

## 2020-10-23 NOTE — Telephone Encounter (Signed)
I sent in glipizide XL 2.5 mg. She should stop the 5 mg dose of glipizide. Tell her congratulations! Since she is eating better, she needs less medication.

## 2020-10-23 NOTE — Telephone Encounter (Signed)
TC regarding her having some low blood sugars.  Sat an 730 am FBS was 107 mg/dl  She ate Egg mcmuffin with extra egg, unsweet tea, BS dropped 1230 pm as she was trying to eat breakfast. After eating 5 oz grilled salmon, 1 cup high protein pasta, (38 g CHO and 10 g PRO), It deopped down to 60's. She ate an orange and it came up to lower 80's afterwards. FBS usually 95-110 mg/dl. BS at night range 130-150's Currently on Metformin 1500 mg a day and 5 mg of Glipizide. Advised to eat meals within 4 hrs of each other and consider talking to PCP to reduce Glipizide to 2.5 mg or less to prevent low blood sugars since her BS are much tighter now with diet changes. Reviewed treatment of low blood sugars and foods to have on hand in purse, car or in house. She verbalized understanding.

## 2020-10-23 NOTE — Telephone Encounter (Signed)
Pt informed

## 2020-10-24 ENCOUNTER — Ambulatory Visit (HOSPITAL_COMMUNITY): Payer: Medicare Other | Admitting: Physical Therapy

## 2020-10-24 ENCOUNTER — Other Ambulatory Visit: Payer: Self-pay

## 2020-10-24 DIAGNOSIS — M25551 Pain in right hip: Secondary | ICD-10-CM | POA: Diagnosis not present

## 2020-10-24 DIAGNOSIS — M25552 Pain in left hip: Secondary | ICD-10-CM | POA: Diagnosis not present

## 2020-10-24 DIAGNOSIS — M545 Low back pain, unspecified: Secondary | ICD-10-CM
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
## 2020-10-29 ENCOUNTER — Encounter (HOSPITAL_COMMUNITY): Payer: Self-pay | Admitting: Physical Therapy

## 2020-10-29 ENCOUNTER — Ambulatory Visit (HOSPITAL_COMMUNITY): Payer: Medicare Other | Admitting: Physical Therapy

## 2020-10-29 ENCOUNTER — Other Ambulatory Visit: Payer: Self-pay

## 2020-10-29 DIAGNOSIS — M25551 Pain in right hip: Secondary | ICD-10-CM | POA: Diagnosis not present

## 2020-10-29 DIAGNOSIS — M25552 Pain in left hip: Secondary | ICD-10-CM

## 2020-10-29 DIAGNOSIS — M545 Low back pain, unspecified: Secondary | ICD-10-CM
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
## 2020-11-01 ENCOUNTER — Telehealth: Payer: Medicare Other | Admitting: Nutrition

## 2020-11-01 ENCOUNTER — Ambulatory Visit (HOSPITAL_COMMUNITY): Payer: Medicare Other | Attending: Sports Medicine | Admitting: Physical Therapy

## 2020-11-01 ENCOUNTER — Other Ambulatory Visit: Payer: Self-pay

## 2020-11-01 DIAGNOSIS — M25551 Pain in right hip: Secondary | ICD-10-CM | POA: Diagnosis not present

## 2020-11-01 DIAGNOSIS — M25552 Pain in left hip: Secondary | ICD-10-CM | POA: Insufficient documentation

## 2020-11-01 DIAGNOSIS — M545 Low back pain, unspecified: Secondary | ICD-10-CM
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
## 2020-11-05 ENCOUNTER — Other Ambulatory Visit: Payer: Self-pay

## 2020-11-05 ENCOUNTER — Ambulatory Visit (HOSPITAL_COMMUNITY): Payer: Medicare Other | Admitting: Physical Therapy

## 2020-11-05 ENCOUNTER — Encounter (HOSPITAL_COMMUNITY): Payer: Self-pay | Admitting: Physical Therapy

## 2020-11-05 DIAGNOSIS — M545 Low back pain, unspecified: Secondary | ICD-10-CM

## 2020-11-05 DIAGNOSIS — M25552 Pain in left hip: Secondary | ICD-10-CM

## 2020-11-05 DIAGNOSIS — M25551 Pain in right hip: Secondary | ICD-10-CM
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
## 2020-11-07 ENCOUNTER — Ambulatory Visit (HOSPITAL_COMMUNITY): Payer: Medicare Other | Admitting: Physical Therapy

## 2020-11-15 DIAGNOSIS — E1165 Type 2 diabetes mellitus with hyperglycemia: Secondary | ICD-10-CM | POA: Diagnosis not present

## 2020-11-15 DIAGNOSIS — E785 Hyperlipidemia, unspecified: Secondary | ICD-10-CM | POA: Diagnosis not present

## 2020-11-15 DIAGNOSIS — I10 Essential (primary) hypertension: Secondary | ICD-10-CM | POA: Diagnosis not present

## 2020-11-16 ENCOUNTER — Other Ambulatory Visit: Payer: Self-pay | Admitting: Nurse Practitioner

## 2020-11-16 DIAGNOSIS — E875 Hyperkalemia: Secondary | ICD-10-CM

## 2020-11-16 DIAGNOSIS — E785 Hyperlipidemia, unspecified: Secondary | ICD-10-CM

## 2020-11-16 LAB — CBC WITH DIFFERENTIAL/PLATELET
Basophils Absolute: 0.1 10*3/uL (ref 0.0–0.2)
Basos: 1 %
EOS (ABSOLUTE): 0.5 10*3/uL — ABNORMAL HIGH (ref 0.0–0.4)
Eos: 5 %
Hematocrit: 35 % (ref 34.0–46.6)
Hemoglobin: 11.5 g/dL (ref 11.1–15.9)
Immature Grans (Abs): 0.1 10*3/uL (ref 0.0–0.1)
Immature Granulocytes: 1 %
Lymphocytes Absolute: 2.6 10*3/uL (ref 0.7–3.1)
Lymphs: 26 %
MCH: 29.2 pg (ref 26.6–33.0)
MCHC: 32.9 g/dL (ref 31.5–35.7)
MCV: 89 fL (ref 79–97)
Monocytes Absolute: 0.7 10*3/uL (ref 0.1–0.9)
Monocytes: 7 %
Neutrophils Absolute: 5.9 10*3/uL (ref 1.4–7.0)
Neutrophils: 60 %
Platelets: 231 10*3/uL (ref 150–450)
RBC: 3.94 x10E6/uL (ref 3.77–5.28)
RDW: 13.8 % (ref 11.7–15.4)
WBC: 9.8 10*3/uL (ref 3.4–10.8)

## 2020-11-16 LAB — CMP14+EGFR
ALT: 18 IU/L (ref 0–32)
AST: 18 IU/L (ref 0–40)
Albumin/Globulin Ratio: 2 (ref 1.2–2.2)
Albumin: 4.4 g/dL (ref 3.8–4.8)
Alkaline Phosphatase: 66 IU/L (ref 44–121)
BUN/Creatinine Ratio: 32 — ABNORMAL HIGH (ref 12–28)
BUN: 32 mg/dL — ABNORMAL HIGH (ref 8–27)
Bilirubin Total: 0.4 mg/dL (ref 0.0–1.2)
CO2: 21 mmol/L (ref 20–29)
Calcium: 9.3 mg/dL (ref 8.7–10.3)
Chloride: 86 mmol/L — ABNORMAL LOW (ref 96–106)
Creatinine, Ser: 0.99 mg/dL (ref 0.57–1.00)
Globulin, Total: 2.2 g/dL (ref 1.5–4.5)
Glucose: 117 mg/dL — ABNORMAL HIGH (ref 65–99)
Potassium: 5.9 mmol/L — ABNORMAL HIGH (ref 3.5–5.2)
Sodium: 125 mmol/L — ABNORMAL LOW (ref 134–144)
Total Protein: 6.6 g/dL (ref 6.0–8.5)
eGFR: 62 mL/min/{1.73_m2} (ref >59)

## 2020-11-16 LAB — HEMOGLOBIN A1C
Est. average glucose Bld gHb Est-mCnc: 137 mg/dL
Hgb A1c MFr Bld: 6.4 % — ABNORMAL HIGH (ref 4.8–5.6)

## 2020-11-16 LAB — LIPID PANEL WITH LDL/HDL RATIO
Cholesterol, Total: 139 mg/dL (ref 100–199)
HDL: 52 mg/dL (ref >39)
LDL Chol Calc (NIH): 55 mg/dL (ref 0–99)
LDL/HDL Ratio: 1.1 ratio (ref 0.0–3.2)
Triglycerides: 200 mg/dL — ABNORMAL HIGH (ref 0–149)
VLDL Cholesterol Cal: 32 mg/dL (ref 5–40)

## 2020-11-16 MED ORDER — AMLODIPINE BESYLATE 10 MG PO TABS: 10.0000 mg | ORAL_TABLET | Freq: Every day | ORAL | 1 refills | Status: DC

## 2020-11-16 NOTE — Progress Notes (Signed)
Her sodium and chloride are low, and her potassium is elevated.   Telmisartan can cause this. STOP telmisartan. I am increasing the amlodipine to 10 mg daily since we are stoping telmisartan.  We need to recheck electrolytes in 2 weeks (around 3/31).

## 2020-11-16 NOTE — Progress Notes (Signed)
Redraw labs in 2 weeks

## 2020-11-19 ENCOUNTER — Ambulatory Visit: Payer: Medicare Other | Admitting: Nurse Practitioner

## 2020-11-20 ENCOUNTER — Encounter: Payer: Self-pay | Admitting: Nurse Practitioner

## 2020-11-20 ENCOUNTER — Ambulatory Visit (INDEPENDENT_AMBULATORY_CARE_PROVIDER_SITE_OTHER): Payer: Medicare Other | Admitting: Nurse Practitioner

## 2020-11-20 ENCOUNTER — Other Ambulatory Visit: Payer: Self-pay

## 2020-11-20 DIAGNOSIS — Z23 Encounter for immunization: Secondary | ICD-10-CM | POA: Insufficient documentation

## 2020-11-20 DIAGNOSIS — N6310 Unspecified lump in the right breast, unspecified quadrant: Secondary | ICD-10-CM | POA: Diagnosis not present

## 2020-11-20 DIAGNOSIS — E1165 Type 2 diabetes mellitus with hyperglycemia: Secondary | ICD-10-CM | POA: Diagnosis not present

## 2020-11-20 DIAGNOSIS — E785 Hyperlipidemia, unspecified: Secondary | ICD-10-CM

## 2020-11-20 DIAGNOSIS — I10 Essential (primary) hypertension: Secondary | ICD-10-CM | POA: Diagnosis not present

## 2020-11-20 MED ORDER — CONTOUR TEST VI STRP: ORAL_STRIP | 5 refills | Status: AC

## 2020-11-20 MED ORDER — AMLODIPINE BESYLATE 5 MG PO TABS: 5.0000 mg | ORAL_TABLET | Freq: Every day | ORAL | 1 refills | Status: DC

## 2020-11-20 MED ORDER — HYDRALAZINE HCL 10 MG PO TABS: 10.0000 mg | ORAL_TABLET | Freq: Three times a day (TID) | ORAL | 2 refills | Status: DC
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
## 2020-11-20 NOTE — Assessment & Plan Note (Signed)
-  stopped telmisartan d/t electrolyte issues -Rx. Hydralazine -she is unable to take amlodipine 10 mg d/t leg swelling; DECREASED to 5 mg

## 2020-11-20 NOTE — Assessment & Plan Note (Addendum)
Lab Results  Component Value Date   CHOL 139 11/15/2020   HDL 52 11/15/2020   LDLCALC 55 11/15/2020   TRIG 200 (H) 11/15/2020  -continue current meds

## 2020-11-20 NOTE — Assessment & Plan Note (Signed)
-  A1c great today -continue meds -seeing Ms Harriett Sine for DM education

## 2020-11-20 NOTE — Assessment & Plan Note (Signed)
-  Shingrix provided today
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
## 2020-11-26 ENCOUNTER — Encounter: Payer: Medicare Other | Attending: Internal Medicine | Admitting: Nutrition

## 2020-11-26 ENCOUNTER — Encounter: Payer: Self-pay | Admitting: Nutrition

## 2020-11-26 VITALS — Wt 258.0 lb

## 2020-11-26 DIAGNOSIS — E1165 Type 2 diabetes mellitus with hyperglycemia: Secondary | ICD-10-CM | POA: Diagnosis not present

## 2020-11-26 DIAGNOSIS — E782 Mixed hyperlipidemia: Secondary | ICD-10-CM | POA: Diagnosis not present

## 2020-11-26 DIAGNOSIS — I10 Essential (primary) hypertension: Secondary | ICD-10-CM | POA: Diagnosis not present

## 2020-11-26 NOTE — Progress Notes (Signed)
Medical Nutrition Therapy FOllow up DM Dm Appointment Start time: 29   Appointment End time:  1330Primary concerns today:Diabetes Type 2, obesity  Referral diagnosis: E11.8, E66.9 Preferred learning style: no preference indicated Learning readiness:  ready  NUTRITION ASSESSMENT  BS have been doing well.  Just lost her last surviving Aunt  Last week. FBS: 80-130's.  FBS at night 120-140's.  Glipizide reduced is 2.5 mg/dl. A1C 6.4%. down from 9%. Drinking more water  Anthropometrics  Wt Readings from Last 3 Encounters:  11/20/20 258 lb (117 kg)  10/01/20 265 lb (120.2 kg)  09/27/20 265 lb (120.2 kg)   Ht Readings from Last 3 Encounters:  11/20/20 5\' 8"  (1.727 m)  10/01/20 5\' 8"  (1.727 m)  09/27/20 5\' 8"  (1.727 m)   There is no height or weight on file to calculate BMI. @BMIFA @ Facility age limit for growth percentiles is 20 years. Facility age limit for growth percentiles is 20 years.   Clinical Medical Hx: DM   Medications: Metformin 500 mg TID, Glipizide 2.5 mg once a day,  Labs: Last December  A1C 9.1% and now down to 6.4%  CMP Latest Ref Rng & Units 11/15/2020 04/02/2018  Glucose 65 - 99 mg/dL 117(H) 147(H)  BUN 8 - 27 mg/dL 32(H) 36(H)  Creatinine 0.57 - 1.00 mg/dL 0.99 1.01(H)  Sodium 134 - 144 mmol/L 125(L) 132(L)  Potassium 3.5 - 5.2 mmol/L 5.9(H) 4.7  Chloride 96 - 106 mmol/L 86(L) 94(L)  CO2 20 - 29 mmol/L 21 23  Calcium 8.7 - 10.3 mg/dL 9.3 8.5(L)  Total Protein 6.0 - 8.5 g/dL 6.6 -  Total Bilirubin 0.0 - 1.2 mg/dL 0.4 -  Alkaline Phos 44 - 121 IU/L 66 -  AST 0 - 40 IU/L 18 -  ALT 0 - 32 IU/L 18 -   Notable Signs/Symptoms: none Lifestyle & Dietary Hx Recently diagnosis:  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 Will start physical therapy next week.  Uses APP for Calorie KIng. Estimated daily fluid intake: 5-6 14 oz of water with ice  Supplements: Eye supplement  Sleep: 6-8 hrs sleeps in recliner Stress / self-care: none other DISH and  arthritis issues. Current average weekly physical activity: ADL  24-Hr Dietary Recall First Meal: 2 EGGS or omelet wholewheat toast, 2,  Second Meal: prepared chicken salad from [employer] 1/2 c,  Third Meal: [place] garlic toast, serving spaghetti sauce, (14 g CHO), with ground round meat, thin spaghetti 3/4 c, Snack:  Cheddar cheese cabot - 1 oz. And 4 crackers.  Beverages: water and unsweet tea  Estimated Energy Needs Calories: 1200 Carbohydrate: 135g Protein: 90g Fat: 33g   NUTRITION DIAGNOSIS  NB-1.1 Food and nutrition-related knowledge deficit As related to diabetes.  As evidenced by A1C 9.1% NUTRITION INTERVENTION  Nutrition education (E-1) on the following topics:  . Nutrition and Diabetes education provided on My Plate, CHO counting, meal planning, portion sizes, timing of meals, avoiding snacks between meals unless having a low blood sugar, target ranges for A1C and blood sugars, signs/symptoms and treatment of hyper/hypoglycemia, monitoring blood sugars, taking medications as prescribed, benefits of exercising 30 minutes per day and prevention of complications of DM. Marland Kitchen   Handouts Provided Include   Summer time meal ideas  Learning Style & Readiness for Change Teaching method utilized: Visual & Auditory  Demonstrated degree of understanding via: Teach Back  Barriers to learning/adherence to lifestyle change: back issues   Goals  Continue to work on portion [employer] classes at [employer] or  exercise as tolerated. Prevent low blood sugars May use Glucerna if needed for delayed meals. Lose 5 lbs in next 2-3 months.   MONITORING & EVALUATION Dietary intake, weekly physical activity, and blood sugars  in 3 month..  Next Steps  Patient is to keep food journal and use app to log foods.Marland Kitchen

## 2020-11-26 NOTE — Patient Instructions (Signed)
Goals  Continue to work on portion [employer] classes at [employer] or exercise as tolerated. Prevent low blood sugars May use Glucerna if needed for delayed meals. Lose 5 lbs in next 2-3 months.

## 2020-12-03 ENCOUNTER — Encounter: Payer: Self-pay | Admitting: Nutrition

## 2020-12-03 DIAGNOSIS — E875 Hyperkalemia: Secondary | ICD-10-CM | POA: Diagnosis not present

## 2020-12-04 ENCOUNTER — Ambulatory Visit: Payer: Medicare Other | Admitting: Nurse Practitioner

## 2020-12-04 LAB — BASIC METABOLIC PANEL
BUN/Creatinine Ratio: 27 (ref 12–28)
BUN: 23 mg/dL (ref 8–27)
CO2: 21 mmol/L (ref 20–29)
Calcium: 9.5 mg/dL (ref 8.7–10.3)
Chloride: 92 mmol/L — ABNORMAL LOW (ref 96–106)
Creatinine, Ser: 0.84 mg/dL (ref 0.57–1.00)
Glucose: 122 mg/dL — ABNORMAL HIGH (ref 65–99)
Potassium: 5 mmol/L (ref 3.5–5.2)
Sodium: 131 mmol/L — ABNORMAL LOW (ref 134–144)
eGFR: 75 mL/min/{1.73_m2} (ref >59)

## 2020-12-04 NOTE — Progress Notes (Signed)
Her potassium has improved and is in the normal range.

## 2020-12-07 ENCOUNTER — Encounter: Payer: Self-pay | Admitting: Nurse Practitioner

## 2020-12-07 ENCOUNTER — Other Ambulatory Visit: Payer: Self-pay

## 2020-12-07 ENCOUNTER — Ambulatory Visit (INDEPENDENT_AMBULATORY_CARE_PROVIDER_SITE_OTHER): Payer: Medicare Other | Admitting: Nurse Practitioner

## 2020-12-07 VITALS — BP 132/76 | HR 90 | Temp 97.9°F | Resp 20 | Ht 68.0 in | Wt 259.0 lb

## 2020-12-07 DIAGNOSIS — I10 Essential (primary) hypertension: Secondary | ICD-10-CM

## 2020-12-07 DIAGNOSIS — E039 Hypothyroidism, unspecified: Secondary | ICD-10-CM

## 2020-12-07 DIAGNOSIS — E1165 Type 2 diabetes mellitus with hyperglycemia: Secondary | ICD-10-CM

## 2020-12-07 DIAGNOSIS — K219 Gastro-esophageal reflux disease without esophagitis: Secondary | ICD-10-CM

## 2020-12-07 DIAGNOSIS — Z23 Encounter for immunization: Secondary | ICD-10-CM

## 2020-12-07 NOTE — Assessment & Plan Note (Addendum)
-  she was previously referred to Dr. Laural Golden, but she states that she can't get an appointment -she would like referral to Dr. Gala Romney; will honor that request

## 2020-12-07 NOTE — Assessment & Plan Note (Signed)
-  TDaP and Prevnar-13 today

## 2020-12-07 NOTE — Assessment & Plan Note (Signed)
-  stopped telmisartan d/t electrolyte issues (hyperkalemia) -she was prescribed Hydralazine at last OV -amlodipine was decreased to 5 mg d/t leg swelling -K is WNL today -NA and Cl trending back to normal
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
## 2020-12-07 NOTE — Patient Instructions (Signed)
Please have fasting labs drawn 2-3 days prior to your appointment so we can discuss the results during your office visit.  

## 2020-12-11 ENCOUNTER — Encounter: Payer: Self-pay | Admitting: Internal Medicine

## 2020-12-18 ENCOUNTER — Other Ambulatory Visit: Payer: Self-pay

## 2020-12-18 ENCOUNTER — Telehealth: Payer: Self-pay

## 2020-12-18 MED ORDER — HYDRALAZINE HCL 10 MG PO TABS: 10.0000 mg | ORAL_TABLET | Freq: Three times a day (TID) | ORAL | 1 refills | Status: DC

## 2020-12-18 NOTE — Telephone Encounter (Signed)
Med refilled for 90 days.

## 2020-12-18 NOTE — Telephone Encounter (Signed)
Pharmacy is calling to get a 90 day supply so the insurance will pay hydralizine 10 mg

## 2020-12-31 ENCOUNTER — Encounter: Payer: Self-pay | Admitting: Internal Medicine

## 2021-01-04 DIAGNOSIS — H353112 Nonexudative age-related macular degeneration, right eye, intermediate dry stage: Secondary | ICD-10-CM | POA: Diagnosis not present

## 2021-01-30 ENCOUNTER — Encounter (HOSPITAL_COMMUNITY): Payer: Self-pay

## 2021-01-30 ENCOUNTER — Emergency Department (HOSPITAL_COMMUNITY)
Admission: EM | Admit: 2021-01-30 | Discharge: 2021-01-30 | Disposition: A | Discharge status: Home / Self Care | Payer: Medicare Other | Attending: Emergency Medicine | Admitting: Emergency Medicine

## 2021-01-30 ENCOUNTER — Other Ambulatory Visit: Payer: Self-pay

## 2021-01-30 DIAGNOSIS — Z96612 Presence of left artificial shoulder joint: Secondary | ICD-10-CM | POA: Diagnosis not present

## 2021-01-30 DIAGNOSIS — Z79899 Other long term (current) drug therapy: Secondary | ICD-10-CM | POA: Diagnosis not present

## 2021-01-30 DIAGNOSIS — Z23 Encounter for immunization: Secondary | ICD-10-CM | POA: Insufficient documentation

## 2021-01-30 DIAGNOSIS — Z203 Contact with and (suspected) exposure to rabies: Secondary | ICD-10-CM | POA: Diagnosis not present

## 2021-01-30 DIAGNOSIS — E039 Hypothyroidism, unspecified: Secondary | ICD-10-CM | POA: Diagnosis not present

## 2021-01-30 DIAGNOSIS — I10 Essential (primary) hypertension: Secondary | ICD-10-CM | POA: Diagnosis not present

## 2021-01-30 DIAGNOSIS — Z7984 Long term (current) use of oral hypoglycemic drugs: Secondary | ICD-10-CM | POA: Diagnosis not present

## 2021-01-30 DIAGNOSIS — Z2914 Encounter for prophylactic rabies immune globin: Secondary | ICD-10-CM | POA: Insufficient documentation

## 2021-01-30 DIAGNOSIS — E119 Type 2 diabetes mellitus without complications: Secondary | ICD-10-CM | POA: Insufficient documentation

## 2021-01-30 DIAGNOSIS — Z209 Contact with and (suspected) exposure to unspecified communicable disease: Secondary | ICD-10-CM

## 2021-01-30 MED ORDER — RABIES VACCINE, PCEC IM SUSR
1.0000 mL | Freq: Once | INTRAMUSCULAR | Status: AC
  Administered 2021-01-30: 1 mL via INTRAMUSCULAR
  Filled 2021-01-30: qty 1

## 2021-01-30 MED ORDER — RABIES IMMUNE GLOBULIN 150 UNIT/ML IM INJ
20.0000 [IU]/kg | INJECTION | Freq: Once | INTRAMUSCULAR | Status: AC
  Administered 2021-01-30: 2475 [IU] via INTRAMUSCULAR
  Filled 2021-01-30: qty 18

## 2021-01-30 NOTE — ED Notes (Signed)
Medications given and verified by Meyer Russel, RN
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
## 2021-01-30 NOTE — ED Triage Notes (Signed)
Pt to er, pt states that she is here because there was a bat in her house, states that pd removed the bat and she was told that she needed to come to the er to get rabies shots.

## 2021-01-30 NOTE — Discharge Instructions (Addendum)
                                  RABIES VACCINE FOLLOW UP  Patient's Name: Julie Bean                     Original Order Date:01/30/2021  Medical Record Number: [id number]  ED Physician: Fredia Sorrow, MD Primary Diagnosis: Rabies Exposure       PCP: Noreene Larsson, NP  Patient Phone Number: (home) [phone] (home)    (cell)  Telephone Information:  Mobile [phone]    (work) There is no work phone number on file. Species of Animal:     You have been seen in the Emergency Department for a possible rabies exposure. It's very important you return for the additional vaccine doses.  Please call the clinic listed below for hours of operation.   Clinic that will administer your rabies vaccines:    DAY 0:  01/30/2021      DAY 3:  02/02/2021       DAY 7:  02/06/2021     DAY 14:  02/13/2021         The 5th vaccine injection is considered for immune compromised patients only.  DAY 28:  02/27/2021      Above are the dates that you will need to return for your rabies vaccine. You can return to the ER or go to Urgent Care. Return to the ER for new or worsening symptoms.

## 2021-02-02 ENCOUNTER — Ambulatory Visit
Admission: EM | Admit: 2021-02-02 | Discharge: 2021-02-02 | Disposition: A | Discharge status: Home / Self Care | Payer: Medicare Other | Attending: Emergency Medicine | Admitting: Emergency Medicine

## 2021-02-02 ENCOUNTER — Other Ambulatory Visit: Payer: Self-pay

## 2021-02-02 ENCOUNTER — Ambulatory Visit: Payer: Self-pay

## 2021-02-02 DIAGNOSIS — Z203 Contact with and (suspected) exposure to rabies: Secondary | ICD-10-CM

## 2021-02-02 DIAGNOSIS — Z2914 Encounter for prophylactic rabies immune globin: Secondary | ICD-10-CM

## 2021-02-02 MED ORDER — RABIES VACCINE, PCEC IM SUSR
1.0000 mL | Freq: Once | INTRAMUSCULAR | Status: AC
  Administered 2021-02-02: 1 mL via INTRAMUSCULAR

## 2021-02-02 NOTE — ED Triage Notes (Signed)
Pt here for 2nd rabies vaccine

## 2021-02-06 ENCOUNTER — Encounter: Payer: Self-pay | Admitting: Emergency Medicine

## 2021-02-06 ENCOUNTER — Other Ambulatory Visit: Payer: Self-pay

## 2021-02-06 ENCOUNTER — Ambulatory Visit
Admission: EM | Admit: 2021-02-06 | Discharge: 2021-02-06 | Disposition: A | Discharge status: Home / Self Care | Payer: Medicare Other | Attending: Family Medicine | Admitting: Family Medicine

## 2021-02-06 DIAGNOSIS — Z2914 Encounter for prophylactic rabies immune globin: Secondary | ICD-10-CM

## 2021-02-06 DIAGNOSIS — Z203 Contact with and (suspected) exposure to rabies: Secondary | ICD-10-CM

## 2021-02-06 MED ORDER — RABIES VACCINE, PCEC IM SUSR
1.0000 mL | Freq: Once | INTRAMUSCULAR | Status: AC
  Administered 2021-02-06: 1 mL via INTRAMUSCULAR

## 2021-02-06 NOTE — ED Triage Notes (Signed)
Here for 3rd rabies vaccine.

## 2021-02-07 ENCOUNTER — Ambulatory Visit: Payer: Medicare Other | Admitting: Gastroenterology

## 2021-02-12 DIAGNOSIS — E118 Type 2 diabetes mellitus with unspecified complications: Secondary | ICD-10-CM | POA: Diagnosis not present

## 2021-02-12 DIAGNOSIS — Q6689 Other  specified congenital deformities of feet: Secondary | ICD-10-CM | POA: Diagnosis not present

## 2021-02-13 ENCOUNTER — Ambulatory Visit
Admission: EM | Admit: 2021-02-13 | Discharge: 2021-02-13 | Disposition: A | Discharge status: Home / Self Care | Payer: Medicare Other | Attending: Family Medicine | Admitting: Family Medicine

## 2021-02-13 ENCOUNTER — Other Ambulatory Visit: Payer: Self-pay

## 2021-02-13 DIAGNOSIS — Z23 Encounter for immunization: Secondary | ICD-10-CM

## 2021-02-13 DIAGNOSIS — Z2914 Encounter for prophylactic rabies immune globin: Secondary | ICD-10-CM | POA: Diagnosis not present

## 2021-02-13 DIAGNOSIS — Z203 Contact with and (suspected) exposure to rabies: Secondary | ICD-10-CM

## 2021-02-13 MED ORDER — RABIES VACCINE, PCEC IM SUSR
1.0000 mL | Freq: Once | INTRAMUSCULAR | Status: AC
  Administered 2021-02-13: 1 mL via INTRAMUSCULAR

## 2021-02-13 NOTE — ED Triage Notes (Signed)
Pt needs final rabies vaccine

## 2021-03-05 DIAGNOSIS — E1165 Type 2 diabetes mellitus with hyperglycemia: Secondary | ICD-10-CM | POA: Diagnosis not present

## 2021-03-05 DIAGNOSIS — I10 Essential (primary) hypertension: Secondary | ICD-10-CM | POA: Diagnosis not present

## 2021-03-05 DIAGNOSIS — E039 Hypothyroidism, unspecified: Secondary | ICD-10-CM | POA: Diagnosis not present

## 2021-03-06 NOTE — Progress Notes (Signed)
A1c is at goal. Sodium and chloride are a little low, so she can add a little table salt to her diet. Thyroid looks great. I'll see her on 7/11.

## 2021-03-07 ENCOUNTER — Other Ambulatory Visit: Payer: Self-pay | Admitting: Nurse Practitioner

## 2021-03-07 DIAGNOSIS — Z1231 Encounter for screening mammogram for malignant neoplasm of breast: Secondary | ICD-10-CM

## 2021-03-07 LAB — CMP14+EGFR
ALT: 14 IU/L (ref 0–32)
AST: 14 IU/L (ref 0–40)
Albumin/Globulin Ratio: 1.9 (ref 1.2–2.2)
Albumin: 4.3 g/dL (ref 3.8–4.8)
Alkaline Phosphatase: 61 IU/L (ref 44–121)
BUN/Creatinine Ratio: 26 (ref 12–28)
BUN: 25 mg/dL (ref 8–27)
Bilirubin Total: 0.4 mg/dL (ref 0.0–1.2)
CO2: 24 mmol/L (ref 20–29)
Calcium: 9.9 mg/dL (ref 8.7–10.3)
Chloride: 93 mmol/L — ABNORMAL LOW (ref 96–106)
Creatinine, Ser: 0.95 mg/dL (ref 0.57–1.00)
Globulin, Total: 2.3 g/dL (ref 1.5–4.5)
Glucose: 128 mg/dL — ABNORMAL HIGH (ref 65–99)
Potassium: 5.3 mmol/L — ABNORMAL HIGH (ref 3.5–5.2)
Sodium: 130 mmol/L — ABNORMAL LOW (ref 134–144)
Total Protein: 6.6 g/dL (ref 6.0–8.5)
eGFR: 65 mL/min/{1.73_m2} (ref >59)

## 2021-03-07 LAB — LIPID PANEL WITH LDL/HDL RATIO
Cholesterol, Total: 163 mg/dL (ref 100–199)
HDL: 57 mg/dL (ref >39)
LDL Chol Calc (NIH): 76 mg/dL (ref 0–99)
LDL/HDL Ratio: 1.3 ratio (ref 0.0–3.2)
Triglycerides: 178 mg/dL — ABNORMAL HIGH (ref 0–149)
VLDL Cholesterol Cal: 30 mg/dL (ref 5–40)

## 2021-03-07 LAB — HEMOGLOBIN A1C
Est. average glucose Bld gHb Est-mCnc: 134 mg/dL
Hgb A1c MFr Bld: 6.3 % — ABNORMAL HIGH (ref 4.8–5.6)

## 2021-03-07 LAB — CBC WITH DIFFERENTIAL/PLATELET
Basophils Absolute: 0.1 10*3/uL (ref 0.0–0.2)
Basos: 1 %
EOS (ABSOLUTE): 0.4 10*3/uL (ref 0.0–0.4)
Eos: 5 %
Hematocrit: 35 % (ref 34.0–46.6)
Hemoglobin: 11.4 g/dL (ref 11.1–15.9)
Immature Grans (Abs): 0.1 10*3/uL (ref 0.0–0.1)
Immature Granulocytes: 1 %
Lymphocytes Absolute: 2 10*3/uL (ref 0.7–3.1)
Lymphs: 28 %
MCH: 28.5 pg (ref 26.6–33.0)
MCHC: 32.6 g/dL (ref 31.5–35.7)
MCV: 88 fL (ref 79–97)
Monocytes Absolute: 0.6 10*3/uL (ref 0.1–0.9)
Monocytes: 8 %
Neutrophils Absolute: 4.1 10*3/uL (ref 1.4–7.0)
Neutrophils: 57 %
Platelets: 247 10*3/uL (ref 150–450)
RBC: 4 x10E6/uL (ref 3.77–5.28)
RDW: 13.7 % (ref 11.7–15.4)
WBC: 7.2 10*3/uL (ref 3.4–10.8)

## 2021-03-07 LAB — MICROALBUMIN / CREATININE URINE RATIO
Creatinine, Urine: 111.6 mg/dL
Microalb/Creat Ratio: 11 mg/g creat (ref 0–29)
Microalbumin, Urine: 12 ug/mL

## 2021-03-07 LAB — TSH+FREE T4
Free T4: 1.61 ng/dL (ref 0.82–1.77)
TSH: 1.85 u[IU]/mL (ref 0.450–4.500)

## 2021-03-08 ENCOUNTER — Ambulatory Visit: Payer: Medicare Other | Admitting: Nurse Practitioner

## 2021-03-08 DIAGNOSIS — M25512 Pain in left shoulder: Secondary | ICD-10-CM | POA: Diagnosis not present

## 2021-03-11 ENCOUNTER — Ambulatory Visit (INDEPENDENT_AMBULATORY_CARE_PROVIDER_SITE_OTHER): Payer: Medicare Other | Admitting: Nurse Practitioner

## 2021-03-11 ENCOUNTER — Other Ambulatory Visit: Payer: Self-pay

## 2021-03-11 ENCOUNTER — Encounter: Payer: Self-pay | Admitting: Nurse Practitioner

## 2021-03-11 VITALS — BP 170/83 | HR 76 | Temp 98.3°F | Resp 18 | Ht 68.0 in | Wt 267.1 lb

## 2021-03-11 DIAGNOSIS — I10 Essential (primary) hypertension: Secondary | ICD-10-CM

## 2021-03-11 DIAGNOSIS — K219 Gastro-esophageal reflux disease without esophagitis: Secondary | ICD-10-CM

## 2021-03-11 DIAGNOSIS — M4810 Ankylosing hyperostosis [Forestier], site unspecified: Secondary | ICD-10-CM | POA: Diagnosis not present

## 2021-03-11 DIAGNOSIS — G8929 Other chronic pain: Secondary | ICD-10-CM

## 2021-03-11 DIAGNOSIS — E785 Hyperlipidemia, unspecified: Secondary | ICD-10-CM

## 2021-03-11 DIAGNOSIS — E1165 Type 2 diabetes mellitus with hyperglycemia: Secondary | ICD-10-CM

## 2021-03-11 DIAGNOSIS — E782 Mixed hyperlipidemia: Secondary | ICD-10-CM

## 2021-03-11 DIAGNOSIS — E039 Hypothyroidism, unspecified: Secondary | ICD-10-CM

## 2021-03-11 MED ORDER — INDOMETHACIN 50 MG PO CAPS: 50.0000 mg | ORAL_CAPSULE | Freq: Two times a day (BID) | ORAL | 3 refills | Status: DC

## 2021-03-11 MED ORDER — GLIPIZIDE ER 5 MG PO TB24
5.0000 mg | ORAL_TABLET | Freq: Every day | ORAL | 1 refills | Status: DC
Start: 2021-03-11 — End: 2021-07-05

## 2021-03-11 MED ORDER — METFORMIN HCL 500 MG PO TABS: 1500.0000 mg | ORAL_TABLET | Freq: Three times a day (TID) | ORAL | 3 refills | Status: DC

## 2021-03-11 MED ORDER — OMEPRAZOLE 40 MG PO CPDR: 40.0000 mg | DELAYED_RELEASE_CAPSULE | Freq: Every day | ORAL | 3 refills | Status: DC

## 2021-03-11 MED ORDER — LEVOTHYROXINE SODIUM 175 MCG PO TABS: 175.0000 ug | ORAL_TABLET | Freq: Every day | ORAL | 3 refills | Status: DC

## 2021-03-11 MED ORDER — ROSUVASTATIN CALCIUM 10 MG PO TABS: 10.0000 mg | ORAL_TABLET | Freq: Every day | ORAL | 3 refills | Status: DC

## 2021-03-11 MED ORDER — HYDROCHLOROTHIAZIDE 12.5 MG PO TABS: 12.5000 mg | ORAL_TABLET | Freq: Every day | ORAL | 3 refills | Status: DC

## 2021-03-11 MED ORDER — LOSARTAN POTASSIUM 50 MG PO TABS
50.0000 mg | ORAL_TABLET | Freq: Every day | ORAL | 1 refills | Status: DC
Start: 2021-03-11 — End: 2021-04-24

## 2021-03-11 MED ORDER — AMLODIPINE BESYLATE 5 MG PO TABS
5.0000 mg | ORAL_TABLET | Freq: Every day | ORAL | 3 refills | Status: DC
Start: 2021-03-11 — End: 2022-05-07

## 2021-03-11 NOTE — Assessment & Plan Note (Signed)
Lab Results  Component Value Date   CHOL 163 03/05/2021   HDL 57 03/05/2021   LDLCALC 76 03/05/2021   TRIG 178 (H) 03/05/2021   -recommend OTC fish oil 3 g daily
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
## 2021-03-11 NOTE — Patient Instructions (Signed)
Please have fasting labs drawn 2-3 days prior to your appointment so we can discuss the results during your office visit.  

## 2021-03-11 NOTE — Assessment & Plan Note (Signed)
-  A1c is at goal today Lab Results  Component Value Date   HGBA1C 6.3 (H) 03/05/2021    -she states that she recently increased her glipizide to 5 mg from 2.5 mg -she is followed by Tamsen Roers, nutritionist for DM

## 2021-03-11 NOTE — Assessment & Plan Note (Addendum)
BP Readings from Last 3 Encounters:  03/11/21 (!) 170/83  02/06/21 128/77  01/30/21 (!) 150/68   -BP elevated today -she takes amlodipine, hydralazine and HCTZ -we stopped temisartan previously d/t hyperkalemia and decreased amlodipine to 5 mg d/t leg swelling -STOP hydralazine; hard to remember all 3 doses -Rx. losartan

## 2021-03-20 ENCOUNTER — Ambulatory Visit: Payer: Medicare Other | Admitting: Nutrition

## 2021-03-20 ENCOUNTER — Encounter: Payer: Medicare Other | Attending: Internal Medicine | Admitting: Nutrition

## 2021-03-20 VITALS — Wt 276.0 lb

## 2021-03-20 DIAGNOSIS — I10 Essential (primary) hypertension: Secondary | ICD-10-CM | POA: Insufficient documentation

## 2021-03-20 DIAGNOSIS — E1165 Type 2 diabetes mellitus with hyperglycemia: Secondary | ICD-10-CM | POA: Insufficient documentation

## 2021-03-20 DIAGNOSIS — E782 Mixed hyperlipidemia: Secondary | ICD-10-CM | POA: Diagnosis not present

## 2021-03-20 NOTE — Progress Notes (Signed)
Medical Nutrition Therapy FOllow up DM Dm Appointment Start time:  1130  Appointment End time:  1400  Concerns today:Diabetes Type 2, obesity  Referral diagnosis: E11.8, E66.9 Preferred learning style: no preference indicated Learning readiness:  ready  NUTRITION ASSESSMENT  Unfortunately she gained about 10 lbs since last visit. She feels some might be fluid. BS have been doing well.  Just lost her last surviving Aunt  Last week. Has been sad, depressed and lonely. Working on eating much better overall. Reduced Glipizide due to low BS between meals. FBS: 80-130's.  FBS at night 120-140's.  Glipizide reduced is 2.5 mg/dl. A1C 6.4%. down from 9%. Drinking more water  Anthropometrics  Wt Readings from Last 3 Encounters:  03/11/21 267 lb 1.9 oz (121.2 kg)  01/30/21 270 lb (122.5 kg)  12/07/20 259 lb (117.5 kg)   Ht Readings from Last 3 Encounters:  03/11/21 5\' 8"  (1.727 m)  01/30/21 5\' 7"  (1.702 m)  12/07/20 5\' 8"  (1.727 m)   There is no height or weight on file to calculate BMI. @BMIFA @ Facility age limit for growth percentiles is 20 years. Facility age limit for growth percentiles is 20 years.   Clinical Medical Hx: DM   Medications: Metformin 500 mg TID, Glipizide 5.0 mg once a day,  Labs: FBS less than 130's Avg: 30 days 137 mg/dl and last 7 days 141 mg/dl. Was taken off of 1-2 different  Last December  A1C 9.1% and now down to 6.3% Is working on scheduling an appt with GI for colonoscopy.  CMP Latest Ref Rng & Units 03/05/2021 12/03/2020 11/15/2020  Glucose 65 - 99 mg/dL 128(H) 122(H) 117(H)  BUN 8 - 27 mg/dL 25 23 32(H)  Creatinine 0.57 - 1.00 mg/dL 0.95 0.84 0.99  Sodium 134 - 144 mmol/L 130(L) 131(L) 125(L)  Potassium 3.5 - 5.2 mmol/L 5.3(H) 5.0 5.9(H)  Chloride 96 - 106 mmol/L 93(L) 92(L) 86(L)  CO2 20 - 29 mmol/L 24 21 21   Calcium 8.7 - 10.3 mg/dL 9.9 9.5 9.3  Total Protein 6.0 - 8.5 g/dL 6.6 - 6.6  Total Bilirubin 0.0 - 1.2 mg/dL 0.4 - 0.4  Alkaline Phos 44 -  121 IU/L 61 - 66  AST 0 - 40 IU/L 14 - 18  ALT 0 - 32 IU/L 14 - 18   Lab Results  Component Value Date   HGBA1C 6.3 (H) 03/05/2021     Notable Signs/Symptoms: none Lifestyle & Dietary Hx Recently diagnosis:  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 Will start physical therapy next week.  Uses APP for Calorie KIng.  Estimated daily fluid intake: 5-6 14 oz of water with ice  Supplements: Eye supplement  Sleep: 6-8 hrs sleeps in recliner Stress / self-care: none other DISH and arthritis issues. Current average weekly physical activity: ADL  24-Hr Dietary Recall  First Meal: 1/2/ bran cereal, 1/2c blueberries, or 2 eggs, 2 slice ww toast , water or unsweet tea Second Meal: 1/2 c rice, beef tips, fried okra, green beans, water  Third Meal: zucchinis lasagna, 1 cup,  water BS at bedtime:  BS 116 mg/dl.. Beverages: water and unsweet tea  Estimated Energy Needs Calories: 1200 Carbohydrate: 135g Protein: 90g Fat: 33g   NUTRITION DIAGNOSIS  NB-1.1 Food and nutrition-related knowledge deficit As related to diabetes.  As evidenced by A1C 9.1% NUTRITION INTERVENTION  Nutrition education (E-1) on the following topics:  Nutrition and Diabetes education provided on My Plate, CHO counting, meal planning, portion sizes, timing of meals, avoiding snacks between meals  unless having a low blood sugar, target ranges for A1C and blood sugars, signs/symptoms and treatment of hyper/hypoglycemia, monitoring blood sugars, taking medications as prescribed, benefits of exercising 30 minutes per day and prevention of complications of DM.   Handouts Provided Include  Summer time meal ideas  Learning Style & Readiness for Change Teaching method utilized: Visual & Auditory  Demonstrated degree of understanding via: Teach Back  Barriers to learning/adherence to lifestyle change: back issues   Goals  Continue to work on portion [employer] classes at [employer] or exercise as  tolerated. Prevent low blood sugars May use Glucerna if needed for delayed meals. Lose 5 lbs in next 2-3 months.   MONITORING & EVALUATION Dietary intake, weekly physical activity, and blood sugars  in 3 month..  Next Steps  Patient is to keep food journal and use app to log foods.Marland Kitchen

## 2021-03-29 ENCOUNTER — Other Ambulatory Visit: Payer: Self-pay

## 2021-03-29 ENCOUNTER — Ambulatory Visit
Admission: RE | Admit: 2021-03-29 | Discharge: 2021-03-29 | Disposition: A | Discharge status: Home / Self Care | Payer: Medicare Other | Source: Ambulatory Visit

## 2021-03-29 DIAGNOSIS — Z1231 Encounter for screening mammogram for malignant neoplasm of breast: Secondary | ICD-10-CM

## 2021-04-01 NOTE — Patient Instructions (Signed)
  Goals  Continue to work on portion [employer] classes at [employer] or exercise as tolerated. Prevent low blood sugars May use Glucerna if needed for delayed meals. Lose 5 lbs in next 2-3 months.

## 2021-04-02 NOTE — Progress Notes (Signed)
Mammogram is great, no evidence of malignancy. Please repeat mammogram in 1 year.

## 2021-04-09 ENCOUNTER — Other Ambulatory Visit: Payer: Self-pay | Admitting: Nurse Practitioner

## 2021-04-09 DIAGNOSIS — I10 Essential (primary) hypertension: Secondary | ICD-10-CM | POA: Diagnosis not present

## 2021-04-10 LAB — BASIC METABOLIC PANEL
BUN/Creatinine Ratio: 28 (ref 12–28)
BUN: 29 mg/dL — ABNORMAL HIGH (ref 8–27)
CO2: 23 mmol/L (ref 20–29)
Calcium: 9.9 mg/dL (ref 8.7–10.3)
Chloride: 90 mmol/L — ABNORMAL LOW (ref 96–106)
Creatinine, Ser: 1.04 mg/dL — ABNORMAL HIGH (ref 0.57–1.00)
Glucose: 137 mg/dL — ABNORMAL HIGH (ref 65–99)
Potassium: 5.7 mmol/L — ABNORMAL HIGH (ref 3.5–5.2)
Sodium: 126 mmol/L — ABNORMAL LOW (ref 134–144)
eGFR: 58 mL/min/{1.73_m2} — ABNORMAL LOW (ref >59)

## 2021-04-10 NOTE — Progress Notes (Signed)
Her potassium has increased from her previous labs, and her sodium and chloride are low. She can increase her salt intake , and drink more water. We will discuss this tomorrow.

## 2021-04-11 ENCOUNTER — Other Ambulatory Visit: Payer: Self-pay

## 2021-04-11 ENCOUNTER — Encounter: Payer: Self-pay | Admitting: Nurse Practitioner

## 2021-04-11 ENCOUNTER — Ambulatory Visit (INDEPENDENT_AMBULATORY_CARE_PROVIDER_SITE_OTHER): Payer: Medicare Other | Admitting: Nurse Practitioner

## 2021-04-11 VITALS — BP 167/77 | HR 81 | Temp 97.3°F | Ht 68.0 in | Wt 267.0 lb

## 2021-04-11 DIAGNOSIS — I10 Essential (primary) hypertension: Secondary | ICD-10-CM | POA: Diagnosis not present

## 2021-04-11 DIAGNOSIS — E875 Hyperkalemia: Secondary | ICD-10-CM

## 2021-04-11 DIAGNOSIS — E871 Hypo-osmolality and hyponatremia: Secondary | ICD-10-CM

## 2021-04-11 NOTE — Assessment & Plan Note (Addendum)
BP Readings from Last 3 Encounters:  04/11/21 (!) 167/77  03/11/21 (!) 170/83  02/06/21 128/77   -hydralazine was working for BP, but we swapped d/t difficulty with TID dosing -tried telmisartan and losartan, but had hyperkalemia with both, so STOP losartan -resume hydralazine  -she is already taking amlodipine -if hydralazine is an issue, may consider ACEi instead of ARB

## 2021-04-11 NOTE — Patient Instructions (Signed)
Please have labs drawn 2-3 days prior to your appointment so we can discuss the results during your office visit.  

## 2021-04-11 NOTE — Assessment & Plan Note (Signed)
-  recheck labs in 2 weeks -stopped losartan -may consider stopping HCTZ if not trending the right direction with next set of labs

## 2021-04-11 NOTE — Assessment & Plan Note (Signed)
-  likely related to losartan; STOP this -repeat labs in 2 weeks
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
## 2021-04-24 ENCOUNTER — Other Ambulatory Visit: Payer: Self-pay

## 2021-04-24 ENCOUNTER — Ambulatory Visit (INDEPENDENT_AMBULATORY_CARE_PROVIDER_SITE_OTHER): Payer: Medicare Other | Admitting: Gastroenterology

## 2021-04-24 ENCOUNTER — Encounter: Payer: Self-pay | Admitting: Gastroenterology

## 2021-04-24 DIAGNOSIS — Z860100 Personal history of colon polyps, unspecified: Secondary | ICD-10-CM | POA: Insufficient documentation
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
## 2021-04-24 NOTE — Patient Instructions (Signed)
We are arranging a colonoscopy with Dr. Gala Romney in the near future.  Do not take metformin or glipizide the morning of the procedure.  Further recommendations to follow!  It was a pleasure to see you today. I want to create trusting relationships with patients to provide genuine, compassionate, and quality care. I value your feedback. If you receive a survey regarding your visit,  I greatly appreciate you taking time to fill this out.   Annitta Needs, PhD, ANP-BC [facility]

## 2021-04-25 DIAGNOSIS — I10 Essential (primary) hypertension: Secondary | ICD-10-CM | POA: Diagnosis not present

## 2021-04-26 LAB — BASIC METABOLIC PANEL
BUN/Creatinine Ratio: 26 (ref 12–28)
BUN: 25 mg/dL (ref 8–27)
CO2: 22 mmol/L (ref 20–29)
Calcium: 9.1 mg/dL (ref 8.7–10.3)
Chloride: 88 mmol/L — ABNORMAL LOW (ref 96–106)
Creatinine, Ser: 0.95 mg/dL (ref 0.57–1.00)
Glucose: 125 mg/dL — ABNORMAL HIGH (ref 65–99)
Potassium: 5.1 mmol/L (ref 3.5–5.2)
Sodium: 126 mmol/L — ABNORMAL LOW (ref 134–144)
eGFR: 65 mL/min/{1.73_m2} (ref >59)

## 2021-04-26 NOTE — Progress Notes (Signed)
Potassium has improved since stopping losartan.

## 2021-04-30 ENCOUNTER — Ambulatory Visit (INDEPENDENT_AMBULATORY_CARE_PROVIDER_SITE_OTHER): Payer: Medicare Other | Admitting: Nurse Practitioner

## 2021-04-30 ENCOUNTER — Other Ambulatory Visit: Payer: Self-pay

## 2021-04-30 ENCOUNTER — Encounter: Payer: Self-pay | Admitting: Nurse Practitioner

## 2021-04-30 VITALS — BP 142/66 | HR 80 | Ht 68.0 in | Wt 269.0 lb

## 2021-04-30 DIAGNOSIS — E1165 Type 2 diabetes mellitus with hyperglycemia: Secondary | ICD-10-CM | POA: Diagnosis not present

## 2021-04-30 DIAGNOSIS — E039 Hypothyroidism, unspecified: Secondary | ICD-10-CM | POA: Diagnosis not present

## 2021-04-30 DIAGNOSIS — E871 Hypo-osmolality and hyponatremia: Secondary | ICD-10-CM | POA: Diagnosis not present

## 2021-04-30 DIAGNOSIS — I10 Essential (primary) hypertension: Secondary | ICD-10-CM | POA: Diagnosis not present

## 2021-04-30 MED ORDER — HYDRALAZINE HCL 25 MG PO TABS
25.0000 mg | ORAL_TABLET | Freq: Three times a day (TID) | ORAL | 1 refills | Status: DC
Start: 2021-04-30 — End: 2022-03-07

## 2021-04-30 NOTE — Assessment & Plan Note (Addendum)
BP Readings from Last 3 Encounters:  04/30/21 (!) 142/66  04/24/21 (!) 145/70  04/11/21 (!) 167/77   -taking amlodipine, hydralazine, and HCTZ -BP improved with hydralazine -INCREASE hydralazine -considering discontinuing HCTZ d/t hyponatremia; may do that with lab check in 6 weeks

## 2021-04-30 NOTE — Assessment & Plan Note (Signed)
-  she states that blood sugar readings have been up and down -she is eating less at dinner to prevent blood sugar spikes, but she would like to eat at dinner -may consider adding trulicity at her next OV; may get pharmacy consult
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
## 2021-04-30 NOTE — Assessment & Plan Note (Signed)
-  recheck labs in 6 weeks; consider cutting HCTZ if sodium is low

## 2021-04-30 NOTE — Patient Instructions (Signed)
Please have fasting labs drawn 2-3 days prior to your appointment so we can discuss the results during your office visit.  

## 2021-05-01 DIAGNOSIS — Z20822 Contact with and (suspected) exposure to covid-19: Secondary | ICD-10-CM | POA: Diagnosis not present

## 2021-05-02 ENCOUNTER — Encounter: Payer: Self-pay | Admitting: *Deleted

## 2021-05-02 ENCOUNTER — Telehealth: Payer: Self-pay | Admitting: *Deleted

## 2021-05-02 MED ORDER — PEG 3350-KCL-NA BICARB-NACL 420 G PO SOLR: ORAL | 0 refills | Status: DC

## 2021-05-02 NOTE — Telephone Encounter (Signed)
Called pt. She has been scheduled for TCS with propofol, ASA 3, Dr. Gala Romney on 10/28 Friday at 9:15am. Aware will need pre-op appt and will mail this with her prep instructions. Advised will send rx to pharmacy. Confirmed address.

## 2021-05-04 IMAGING — MG DIGITAL SCREENING BILATERAL MAMMOGRAM WITH TOMO AND CAD
6 of 10 series · 6 of 30 positions shown · non-contrast
Comparison: Previous exam(s).

CLINICAL DATA: Screening.

EXAM:
DIGITAL SCREENING BILATERAL MAMMOGRAM WITH TOMO AND CAD

[R CC synth-2D]
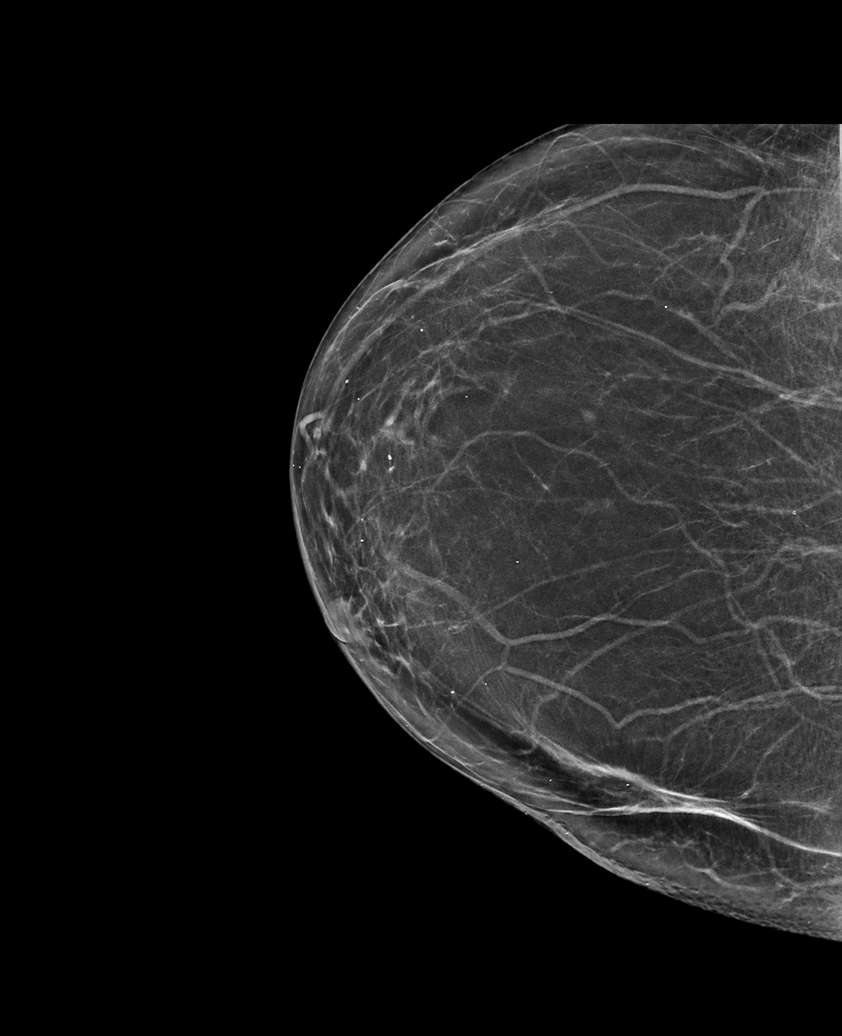

[L CC synth-2D]
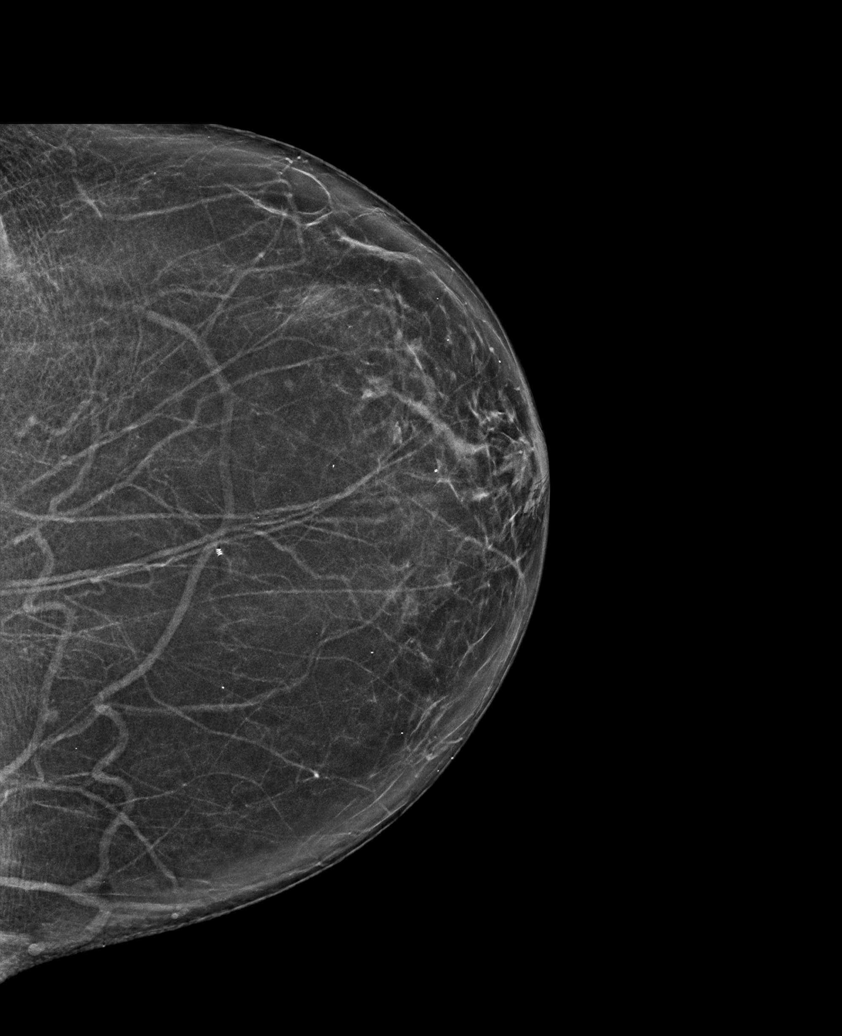

[L MLO synth-2D (1 of 2)]
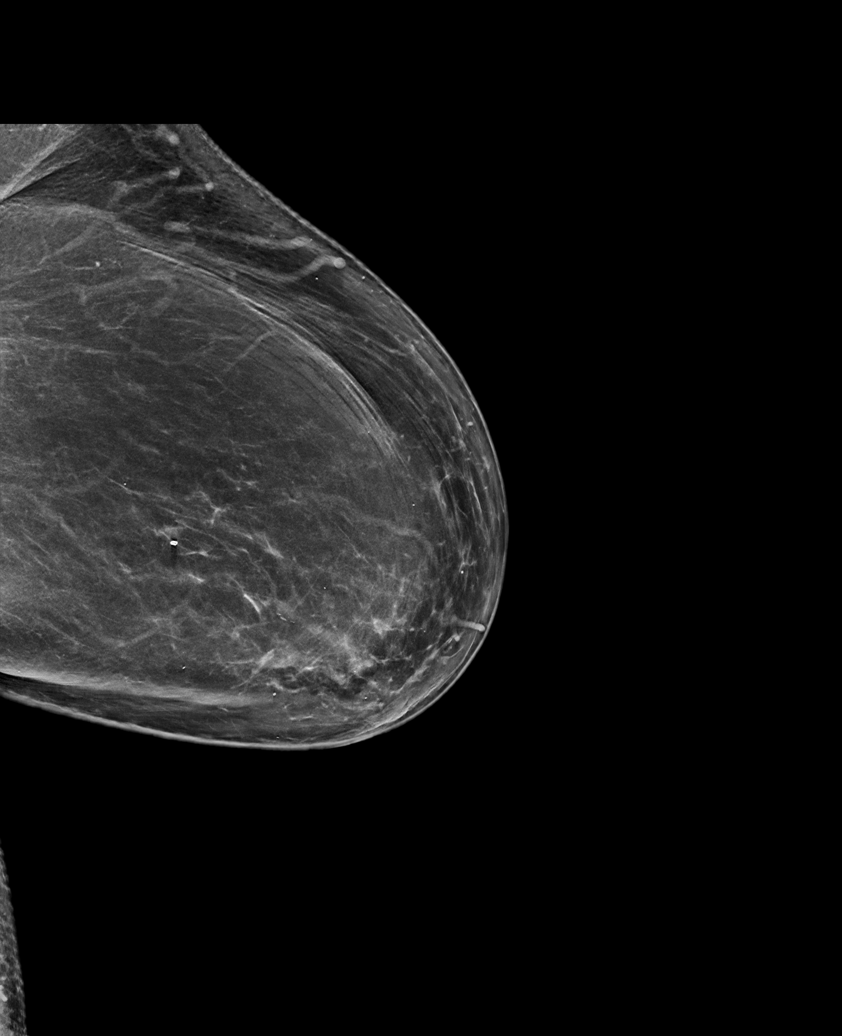

[R MLO synth-2D]
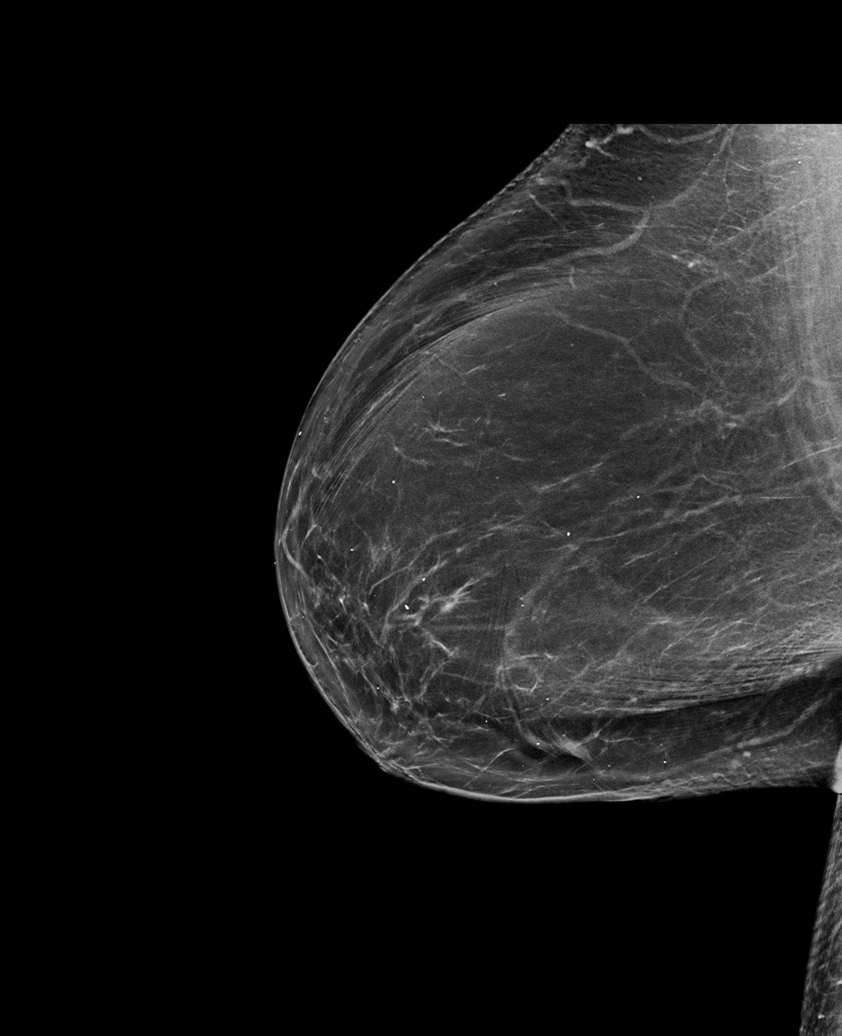

[L MLO synth-2D (2 of 2)]
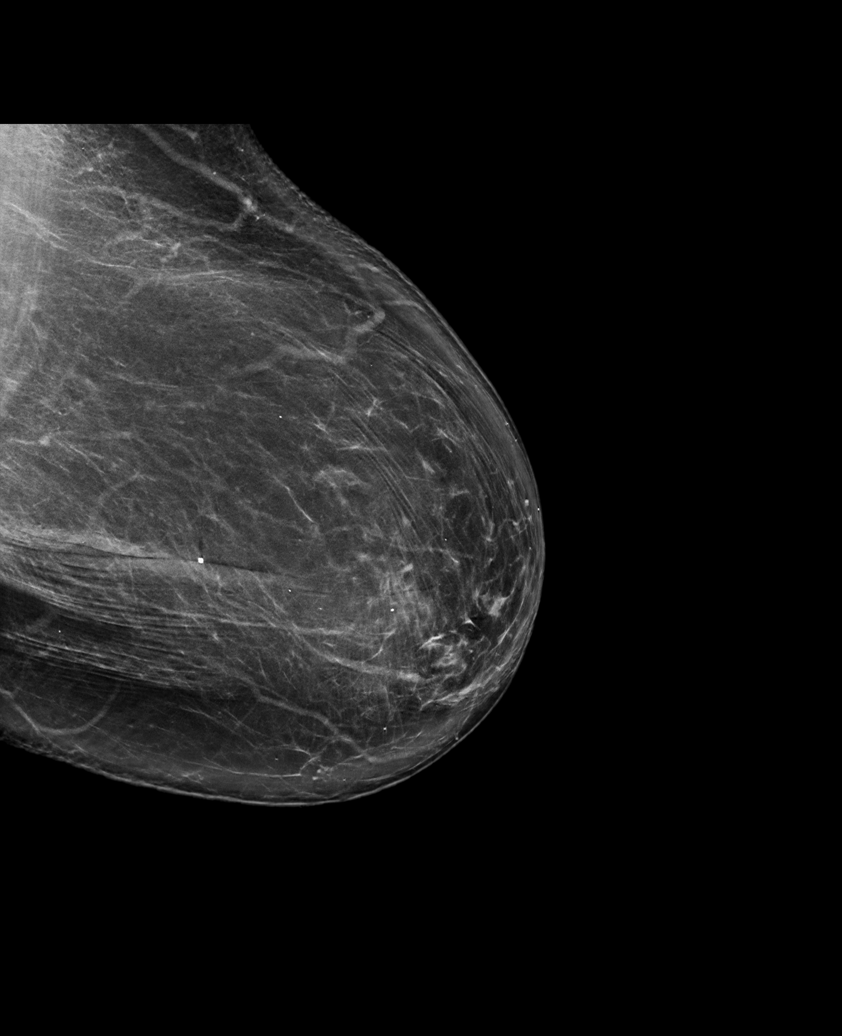

[L CC tomo · tomo slice 34/67.0]
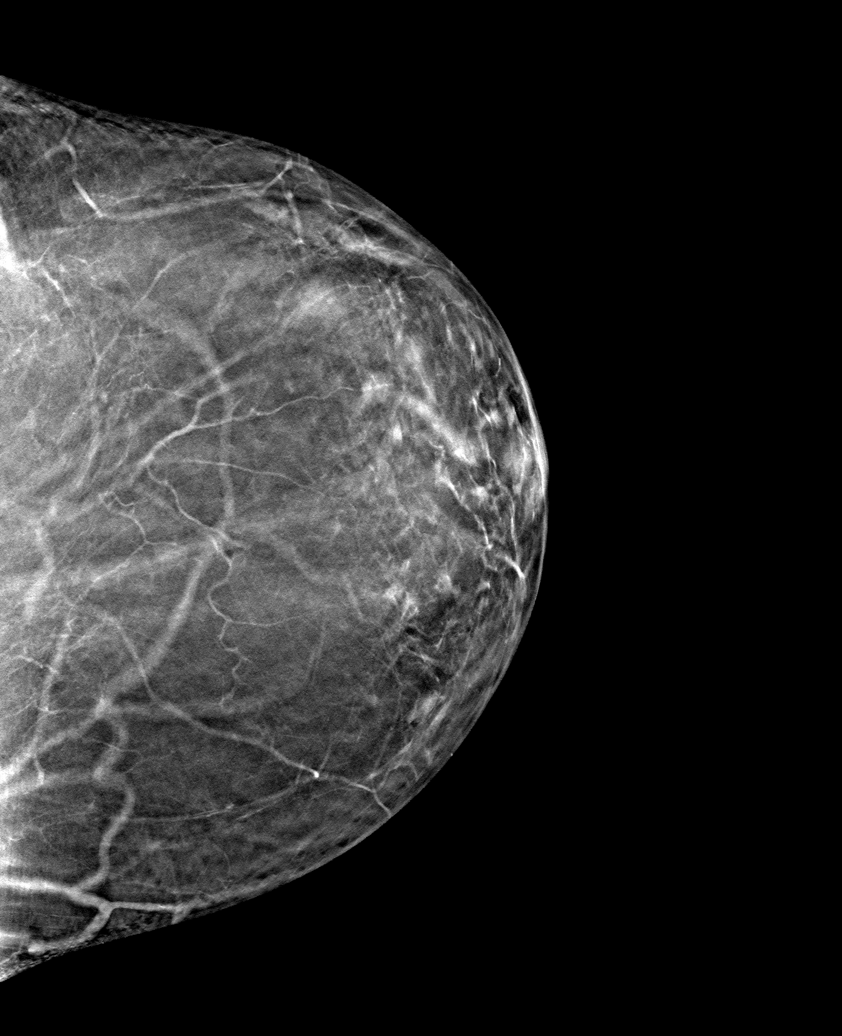

[6 of 30 positions shown; findings below may reference images not displayed]

ACR Breast Density Category b: There are scattered areas of
fibroglandular density.
FINDINGS: There are no findings suspicious for malignancy. Images were
processed with CAD.
IMPRESSION: No mammographic evidence of malignancy. A result letter of this
screening mammogram will be mailed directly to the patient.

RECOMMENDATION:
Screening mammogram in one year. (Code:[id number])

BI-RADS CATEGORY  1: Negative.

## 2021-05-15 ENCOUNTER — Telehealth: Payer: Self-pay

## 2021-05-15 NOTE — Telephone Encounter (Signed)
Patient called said the medicine that she is taking now she thinks it is making her feet swell.  Asked if nurse could give her a call back. Call back # [phone].

## 2021-05-15 NOTE — Telephone Encounter (Signed)
Pt says since the Hydralazine was increased her feet have been swelling and they feel uncomfortable. She said they didn't do this with the lower dose, but the lower dose wasn't helping with her blood pressure. Please advise.

## 2021-05-15 NOTE — Telephone Encounter (Signed)
Of the medicines on her list, amlodipine is the most likely to cause leg swelling. Have her stop amlodipine for a week and see if leg swelling improves. If amlodipine is the culprit, we may increase hydralazine. Let's see her in the office in 2 weeks.

## 2021-05-15 NOTE — Telephone Encounter (Signed)
Left message

## 2021-05-15 NOTE — Telephone Encounter (Signed)
Pt informed. Appt made

## 2021-05-16 ENCOUNTER — Telehealth: Payer: Self-pay | Admitting: Nurse Practitioner

## 2021-05-16 NOTE — Telephone Encounter (Signed)
Spoke with pt yesterday about this.

## 2021-05-16 NOTE — Telephone Encounter (Signed)
Pt is returning your call

## 2021-05-21 ENCOUNTER — Telehealth: Payer: Self-pay | Admitting: Nurse Practitioner

## 2021-05-21 NOTE — Telephone Encounter (Signed)
It is possible, but leg swelling is common with amlodipine, but isn't listed as a side effect of hydralazine. If she stops hydralazine, and it improves, then it is the hydralazine. I'd recommend taking the amlodipine for BP if she is stopping hydralazine.

## 2021-05-21 NOTE — Telephone Encounter (Signed)
Pt is returning your call

## 2021-05-22 NOTE — Telephone Encounter (Signed)
Pt informed

## 2021-05-23 ENCOUNTER — Telehealth: Payer: Self-pay

## 2021-05-23 ENCOUNTER — Other Ambulatory Visit: Payer: Self-pay

## 2021-05-23 DIAGNOSIS — E1165 Type 2 diabetes mellitus with hyperglycemia: Secondary | ICD-10-CM

## 2021-05-23 MED ORDER — METFORMIN HCL 500 MG PO TABS: 500.0000 mg | ORAL_TABLET | Freq: Three times a day (TID) | ORAL | 3 refills | Status: DC

## 2021-05-23 NOTE — Telephone Encounter (Signed)
[facility] called received Metformin 500 mg, 3 tablets 3 x day, needs to verify dosage usually only twice a day.  Call back # [phone]

## 2021-05-23 NOTE — Telephone Encounter (Signed)
[facility] returning call. Call back [phone]

## 2021-05-23 NOTE — Telephone Encounter (Signed)
Sent correct Rx in and pt was informed.

## 2021-05-23 NOTE — Telephone Encounter (Signed)
Left message

## 2021-05-31 ENCOUNTER — Encounter: Payer: Self-pay | Admitting: Nurse Practitioner

## 2021-05-31 ENCOUNTER — Ambulatory Visit (INDEPENDENT_AMBULATORY_CARE_PROVIDER_SITE_OTHER): Payer: Medicare Other | Admitting: Nurse Practitioner

## 2021-05-31 ENCOUNTER — Ambulatory Visit (HOSPITAL_COMMUNITY)
Admission: RE | Admit: 2021-05-31 | Discharge: 2021-05-31 | Disposition: A | Discharge status: Home / Self Care | Payer: Medicare Other | Source: Ambulatory Visit | Attending: Nurse Practitioner | Admitting: Nurse Practitioner

## 2021-05-31 ENCOUNTER — Other Ambulatory Visit: Payer: Self-pay

## 2021-05-31 VITALS — BP 176/81 | HR 85 | Ht 68.0 in | Wt 282.0 lb

## 2021-05-31 DIAGNOSIS — M7989 Other specified soft tissue disorders: Secondary | ICD-10-CM | POA: Insufficient documentation

## 2021-05-31 DIAGNOSIS — I10 Essential (primary) hypertension: Secondary | ICD-10-CM | POA: Insufficient documentation

## 2021-05-31 DIAGNOSIS — I509 Heart failure, unspecified: Secondary | ICD-10-CM | POA: Diagnosis not present

## 2021-05-31 DIAGNOSIS — M47814 Spondylosis without myelopathy or radiculopathy, thoracic region: Secondary | ICD-10-CM | POA: Diagnosis not present

## 2021-05-31 DIAGNOSIS — Z96642 Presence of left artificial hip joint: Secondary | ICD-10-CM | POA: Diagnosis not present

## 2021-05-31 MED ORDER — FUROSEMIDE 40 MG PO TABS: 40.0000 mg | ORAL_TABLET | Freq: Every day | ORAL | 0 refills | Status: DC

## 2021-05-31 MED ORDER — POTASSIUM CHLORIDE CRYS ER 20 MEQ PO TBCR: 20.0000 meq | EXTENDED_RELEASE_TABLET | Freq: Every day | ORAL | 0 refills | Status: DC

## 2021-05-31 MED ORDER — METOPROLOL SUCCINATE ER 25 MG PO TB24: 25.0000 mg | ORAL_TABLET | Freq: Every day | ORAL | 3 refills | Status: DC
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
## 2021-05-31 NOTE — Assessment & Plan Note (Addendum)
-  2+ pitting edema to bilateral lower legs -CXR today to r/o CHF -lasix and KCl as above -referral to cardiology

## 2021-05-31 NOTE — Assessment & Plan Note (Addendum)
BP Readings from Last 3 Encounters:  05/31/21 (!) 176/81  04/30/21 (!) 142/66  04/24/21 (!) 145/70   Wt Readings from Last 3 Encounters:  05/31/21 282 lb (127.9 kg)  04/30/21 269 lb (122 kg)  04/24/21 269 lb (122 kg)   -she is up 13 pounds since her last OV; ?CHF -Rx. Lasix 40 mg daily and potassium -Rx. Metoprolol -she has been taking amlodipine and HCTZ; wasn't taking hydralazine

## 2021-05-31 NOTE — Patient Instructions (Signed)
Please have a metabolic panel drawn today.  The chest x-ray can be completed at [facility] today as well.

## 2021-06-01 LAB — BASIC METABOLIC PANEL
BUN/Creatinine Ratio: 27 (ref 12–28)
BUN: 21 mg/dL (ref 8–27)
CO2: 20 mmol/L (ref 20–29)
Calcium: 9.7 mg/dL (ref 8.7–10.3)
Chloride: 95 mmol/L — ABNORMAL LOW (ref 96–106)
Creatinine, Ser: 0.77 mg/dL (ref 0.57–1.00)
Glucose: 170 mg/dL — ABNORMAL HIGH (ref 70–99)
Potassium: 4.9 mmol/L (ref 3.5–5.2)
Sodium: 137 mmol/L (ref 134–144)
eGFR: 83 mL/min/{1.73_m2} (ref >59)

## 2021-06-03 NOTE — Progress Notes (Signed)
Na and K are WNL.

## 2021-06-04 DIAGNOSIS — E871 Hypo-osmolality and hyponatremia: Secondary | ICD-10-CM | POA: Diagnosis not present

## 2021-06-04 DIAGNOSIS — I10 Essential (primary) hypertension: Secondary | ICD-10-CM | POA: Diagnosis not present

## 2021-06-04 DIAGNOSIS — E1165 Type 2 diabetes mellitus with hyperglycemia: Secondary | ICD-10-CM | POA: Diagnosis not present

## 2021-06-04 DIAGNOSIS — E039 Hypothyroidism, unspecified: Secondary | ICD-10-CM | POA: Diagnosis not present

## 2021-06-05 LAB — LIPID PANEL WITH LDL/HDL RATIO
Cholesterol, Total: 162 mg/dL (ref 100–199)
HDL: 45 mg/dL (ref >39)
LDL Chol Calc (NIH): 76 mg/dL (ref 0–99)
LDL/HDL Ratio: 1.7 ratio (ref 0.0–3.2)
Triglycerides: 252 mg/dL — ABNORMAL HIGH (ref 0–149)
VLDL Cholesterol Cal: 41 mg/dL — ABNORMAL HIGH (ref 5–40)

## 2021-06-05 LAB — CBC WITH DIFFERENTIAL/PLATELET
Basophils Absolute: 0.1 10*3/uL (ref 0.0–0.2)
Basos: 2 %
EOS (ABSOLUTE): 0.3 10*3/uL (ref 0.0–0.4)
Eos: 5 %
Hematocrit: 33.6 % — ABNORMAL LOW (ref 34.0–46.6)
Hemoglobin: 11 g/dL — ABNORMAL LOW (ref 11.1–15.9)
Immature Grans (Abs): 0.1 10*3/uL (ref 0.0–0.1)
Immature Granulocytes: 1 %
Lymphocytes Absolute: 1.5 10*3/uL (ref 0.7–3.1)
Lymphs: 23 %
MCH: 27.8 pg (ref 26.6–33.0)
MCHC: 32.7 g/dL (ref 31.5–35.7)
MCV: 85 fL (ref 79–97)
Monocytes Absolute: 0.6 10*3/uL (ref 0.1–0.9)
Monocytes: 10 %
Neutrophils Absolute: 4 10*3/uL (ref 1.4–7.0)
Neutrophils: 59 %
Platelets: 238 10*3/uL (ref 150–450)
RBC: 3.96 x10E6/uL (ref 3.77–5.28)
RDW: 14.4 % (ref 11.7–15.4)
WBC: 6.6 10*3/uL (ref 3.4–10.8)

## 2021-06-05 LAB — CMP14+EGFR
ALT: 10 IU/L (ref 0–32)
AST: 14 IU/L (ref 0–40)
Albumin/Globulin Ratio: 2 (ref 1.2–2.2)
Albumin: 4.3 g/dL (ref 3.8–4.8)
Alkaline Phosphatase: 61 IU/L (ref 44–121)
BUN/Creatinine Ratio: 26 (ref 12–28)
BUN: 24 mg/dL (ref 8–27)
Bilirubin Total: 0.5 mg/dL (ref 0.0–1.2)
CO2: 22 mmol/L (ref 20–29)
Calcium: 9.5 mg/dL (ref 8.7–10.3)
Chloride: 84 mmol/L — ABNORMAL LOW (ref 96–106)
Creatinine, Ser: 0.94 mg/dL (ref 0.57–1.00)
Globulin, Total: 2.1 g/dL (ref 1.5–4.5)
Glucose: 146 mg/dL — ABNORMAL HIGH (ref 70–99)
Potassium: 4.7 mmol/L (ref 3.5–5.2)
Sodium: 124 mmol/L — ABNORMAL LOW (ref 134–144)
Total Protein: 6.4 g/dL (ref 6.0–8.5)
eGFR: 66 mL/min/{1.73_m2} (ref >59)

## 2021-06-05 LAB — HEMOGLOBIN A1C
Est. average glucose Bld gHb Est-mCnc: 148 mg/dL
Hgb A1c MFr Bld: 6.8 % — ABNORMAL HIGH (ref 4.8–5.6)

## 2021-06-05 LAB — TSH+FREE T4
Free T4: 1.65 ng/dL (ref 0.82–1.77)
TSH: 2.28 u[IU]/mL (ref 0.450–4.500)

## 2021-06-05 NOTE — Progress Notes (Signed)
Labs look great except for sodium and chloride which are a little low. Lasix will decrease these electrolytes, but you can increase your dietary salt intake a little while taking lasix.

## 2021-06-07 ENCOUNTER — Other Ambulatory Visit: Payer: Self-pay

## 2021-06-07 ENCOUNTER — Ambulatory Visit (INDEPENDENT_AMBULATORY_CARE_PROVIDER_SITE_OTHER): Payer: Medicare Other | Admitting: Nurse Practitioner

## 2021-06-07 ENCOUNTER — Telehealth: Payer: Self-pay

## 2021-06-07 ENCOUNTER — Encounter: Payer: Self-pay | Admitting: Nurse Practitioner

## 2021-06-07 VITALS — BP 146/81 | HR 80 | Temp 98.0°F | Ht 68.0 in | Wt 268.1 lb

## 2021-06-07 DIAGNOSIS — E871 Hypo-osmolality and hyponatremia: Secondary | ICD-10-CM

## 2021-06-07 DIAGNOSIS — I10 Essential (primary) hypertension: Secondary | ICD-10-CM

## 2021-06-07 DIAGNOSIS — M7989 Other specified soft tissue disorders: Secondary | ICD-10-CM

## 2021-06-07 MED ORDER — POTASSIUM CHLORIDE CRYS ER 20 MEQ PO TBCR
20.0000 meq | EXTENDED_RELEASE_TABLET | Freq: Every day | ORAL | 0 refills | Status: DC
Start: 2021-06-07 — End: 2021-07-05

## 2021-06-07 MED ORDER — FUROSEMIDE 40 MG PO TABS: 40.0000 mg | ORAL_TABLET | Freq: Every day | ORAL | 0 refills | Status: DC

## 2021-06-07 NOTE — Telephone Encounter (Signed)
Patient called left voicemail, she seen Pearline Cables this morning and has some questions about her AVS summary. Please call patient back at # [phone].

## 2021-06-07 NOTE — Assessment & Plan Note (Signed)
BP Readings from Last 3 Encounters:  06/07/21 (!) 146/81  05/31/21 (!) 176/81  04/30/21 (!) 142/66   -BP improved after adding lasix, and she is down 14 pounds -ankle edema improved greatly Wt Readings from Last 3 Encounters:  06/07/21 268 lb 1.3 oz (121.6 kg)  05/31/21 282 lb (127.9 kg)  04/30/21 269 lb (122 kg)   -discussed decreasing lasix to 20 mg daily and cutting potassium down to 10 mEq per day, but she cand increase this if swelling gets worse or she gains more than 2 pounds overnight

## 2021-06-07 NOTE — Assessment & Plan Note (Signed)
Lab Results  Component Value Date   NA 124 (L) 06/04/2021   K 4.7 06/04/2021   CL 84 (L) 06/04/2021   CO2 22 06/04/2021   -NA and CL decreased after starting lasix -considered swapping HCTZ for spironolactone, but she doesn't want to swap meds while she is having few side effects; discussed increasing sodium intake if she has cramps and signs of hyponatremia, and she states she has been using pickle juice PRN -recheck labs after she gets back from vacation in 3 weeks, would likely swap off HCTZ to spironolactone then

## 2021-06-07 NOTE — Patient Instructions (Addendum)
-  discussed decreasing lasix to 20 mg daily and cutting potassium down to 10 mEq per day, but she can increase this if swelling gets worse or she gains more than 2 pounds overnight
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
## 2021-06-07 NOTE — Telephone Encounter (Signed)
Spoke to patient

## 2021-06-14 ENCOUNTER — Ambulatory Visit: Payer: Medicare Other | Admitting: Nurse Practitioner

## 2021-06-24 NOTE — Patient Instructions (Signed)
   Your procedure is scheduled on: 06/28/2021  Report to [facility] at    7:45 AM.  Call this number if you have problems the morning of surgery: [phone]   Remember:              Follow Directions on the letter you received from Your Physician's office regarding the Bowel Prep              No Smoking the day of Procedure :   Take these medicines the morning of surgery with A SIP OF WATER: Amlodipine, levothyroxine, metoprolol, and omeprazole  No diabetic medication am of procedure   Do not wear jewelry, make-up or nail polish.    Do not bring valuables to the hospital.  Contacts, dentures or bridgework may not be worn into surgery.  .   Patients discharged the day of surgery will not be allowed to drive home.     Colonoscopy, Adult, Care After This sheet gives you information about how to care for yourself after your procedure. Your health care provider may also give you more specific instructions. If you have problems or questions, contact your health care provider. What can I expect after the procedure? After the procedure, it is common to have: A small amount of blood in your stool for 24 hours after the procedure. Some gas. Mild abdominal cramping or bloating.  Follow these instructions at home: General instructions  For the first 24 hours after the procedure: Do not drive or use machinery. Do not sign important documents. Do not drink alcohol. Do your regular daily activities at a slower pace than normal. Eat soft, easy-to-digest foods. Rest often. Take over-the-counter or prescription medicines only as told by your health care provider. It is up to you to get the results of your procedure. Ask your health care provider, or the department performing the procedure, when your results will be ready. Relieving cramping and bloating Try walking around when you have cramps or feel bloated. Apply heat to your abdomen as told by your health care provider. Use a heat  source that your health care provider recommends, such as a moist heat pack or a heating pad. Place a towel between your skin and the heat source. Leave the heat on for 20-30 minutes. Remove the heat if your skin turns bright red. This is especially important if you are unable to feel pain, heat, or cold. You may have a greater risk of getting burned. Eating and drinking Drink enough fluid to keep your urine clear or pale yellow. Resume your normal diet as instructed by your health care provider. Avoid heavy or fried foods that are hard to digest. Avoid drinking alcohol for as long as instructed by your health care provider. Contact a health care provider if: You have blood in your stool 2-3 days after the procedure. Get help right away if: You have more than a small spotting of blood in your stool. You pass large blood clots in your stool. Your abdomen is swollen. You have nausea or vomiting. You have a fever. You have increasing abdominal pain that is not relieved with medicine. This information is not intended to replace advice given to you by your health care provider. Make sure you discuss any questions you have with your health care provider. Document Released: 04/01/2004 Document Revised: 05/12/2016 Document Reviewed: 10/30/2015 Elsevier Interactive Patient Education  [employer].

## 2021-06-25 ENCOUNTER — Other Ambulatory Visit: Payer: Self-pay

## 2021-06-25 ENCOUNTER — Encounter (HOSPITAL_COMMUNITY)
Admission: RE | Admit: 2021-06-25 | Discharge: 2021-06-25 | Disposition: A | Discharge status: Home / Self Care | Payer: Medicare Other | Source: Ambulatory Visit | Attending: Internal Medicine | Admitting: Internal Medicine

## 2021-06-25 ENCOUNTER — Encounter (HOSPITAL_COMMUNITY): Payer: Self-pay

## 2021-06-25 VITALS — HR 79 | Temp 98.5°F | Resp 18 | Ht 68.0 in | Wt 268.1 lb

## 2021-06-25 DIAGNOSIS — Z01818 Encounter for other preprocedural examination: Secondary | ICD-10-CM | POA: Insufficient documentation

## 2021-06-25 DIAGNOSIS — E871 Hypo-osmolality and hyponatremia: Secondary | ICD-10-CM | POA: Diagnosis not present

## 2021-06-25 LAB — BASIC METABOLIC PANEL
Anion gap: 11 (ref 5–15)
BUN: 18 mg/dL (ref 8–23)
CO2: 24 mmol/L (ref 22–32)
Calcium: 8.5 mg/dL — ABNORMAL LOW (ref 8.9–10.3)
Chloride: 92 mmol/L — ABNORMAL LOW (ref 98–111)
Creatinine, Ser: 0.79 mg/dL (ref 0.44–1.00)
GFR, Estimated: >60 mL/min (ref >60)
Glucose, Bld: 177 mg/dL — ABNORMAL HIGH (ref 70–99)
Potassium: 4.4 mmol/L (ref 3.5–5.1)
Sodium: 127 mmol/L — ABNORMAL LOW (ref 135–145)

## 2021-06-28 ENCOUNTER — Other Ambulatory Visit: Payer: Self-pay

## 2021-06-28 ENCOUNTER — Encounter (HOSPITAL_COMMUNITY): Payer: Self-pay | Admitting: Internal Medicine

## 2021-06-28 ENCOUNTER — Ambulatory Visit (HOSPITAL_COMMUNITY)
Admission: RE | Admit: 2021-06-28 | Discharge: 2021-06-28 | Disposition: A | Discharge status: Home / Self Care | Payer: Medicare Other | Attending: Internal Medicine | Admitting: Internal Medicine

## 2021-06-28 ENCOUNTER — Ambulatory Visit (HOSPITAL_COMMUNITY): Payer: Medicare Other | Admitting: Anesthesiology

## 2021-06-28 ENCOUNTER — Encounter (HOSPITAL_COMMUNITY)
Admission: RE | Disposition: A | Discharge status: Home / Self Care | Payer: Self-pay | Source: Home / Self Care | Attending: Internal Medicine

## 2021-06-28 DIAGNOSIS — Z79899 Other long term (current) drug therapy: Secondary | ICD-10-CM | POA: Diagnosis not present

## 2021-06-28 DIAGNOSIS — Z1211 Encounter for screening for malignant neoplasm of colon: Secondary | ICD-10-CM | POA: Diagnosis not present

## 2021-06-28 DIAGNOSIS — I10 Essential (primary) hypertension: Secondary | ICD-10-CM | POA: Diagnosis not present

## 2021-06-28 DIAGNOSIS — K219 Gastro-esophageal reflux disease without esophagitis: Secondary | ICD-10-CM | POA: Diagnosis not present

## 2021-06-28 DIAGNOSIS — E039 Hypothyroidism, unspecified: Secondary | ICD-10-CM | POA: Insufficient documentation

## 2021-06-28 DIAGNOSIS — E119 Type 2 diabetes mellitus without complications: Secondary | ICD-10-CM | POA: Diagnosis not present

## 2021-06-28 DIAGNOSIS — Q438 Other specified congenital malformations of intestine: Secondary | ICD-10-CM | POA: Diagnosis not present

## 2021-06-28 DIAGNOSIS — E785 Hyperlipidemia, unspecified: Secondary | ICD-10-CM | POA: Insufficient documentation

## 2021-06-28 DIAGNOSIS — K5730 Diverticulosis of large intestine without perforation or abscess without bleeding: Secondary | ICD-10-CM | POA: Insufficient documentation

## 2021-06-28 DIAGNOSIS — Z79890 Hormone replacement therapy (postmenopausal): Secondary | ICD-10-CM | POA: Insufficient documentation

## 2021-06-28 DIAGNOSIS — Z6841 Body Mass Index (BMI) 40.0 and over, adult: Secondary | ICD-10-CM | POA: Diagnosis not present

## 2021-06-28 DIAGNOSIS — Z86010 Personal history of colonic polyps: Secondary | ICD-10-CM | POA: Diagnosis not present

## 2021-06-28 DIAGNOSIS — Z7984 Long term (current) use of oral hypoglycemic drugs: Secondary | ICD-10-CM | POA: Insufficient documentation

## 2021-06-28 HISTORY — PX: COLONOSCOPY WITH PROPOFOL: SHX5780

## 2021-06-28 LAB — GLUCOSE, CAPILLARY: Glucose-Capillary: 163 mg/dL — ABNORMAL HIGH (ref 70–99)

## 2021-06-28 SURGERY — COLONOSCOPY WITH PROPOFOL
Anesthesia: General

## 2021-06-28 MED ORDER — LACTATED RINGERS IV SOLN: INTRAVENOUS | Status: DC

## 2021-06-28 MED ORDER — PROPOFOL 1000 MG/100ML IV EMUL
INTRAVENOUS | Status: AC
  Filled 2021-06-28: qty 200

## 2021-06-28 MED ORDER — PROPOFOL 10 MG/ML IV BOLUS
INTRAVENOUS | Status: DC | PRN
  Administered 2021-06-28: 100 mg via INTRAVENOUS

## 2021-06-28 MED ORDER — PROPOFOL 500 MG/50ML IV EMUL
INTRAVENOUS | Status: DC | PRN
  Administered 2021-06-28: 150 ug/kg/min via INTRAVENOUS

## 2021-06-28 NOTE — Anesthesia Postprocedure Evaluation (Signed)
Anesthesia Post Note  Patient: Julie Bean  Procedure(s) Performed: COLONOSCOPY WITH PROPOFOL  Patient location during evaluation: Phase II Anesthesia Type: General Level of consciousness: awake and alert and oriented Pain management: pain level controlled Vital Signs Assessment: post-procedure vital signs reviewed and stable Respiratory status: spontaneous breathing, nonlabored ventilation and respiratory function stable Cardiovascular status: blood pressure returned to baseline and stable Postop Assessment: no apparent nausea or vomiting Anesthetic complications: no   No notable events documented.   Last Vitals:  Vitals:   06/28/21 0751 06/28/21 0932  BP: (!) 145/65 (!) 113/51  Pulse: 78 76  Resp: 18 18  Temp: 36.8 C 36.7 C  SpO2: 96% 94%    Last Pain:  Vitals:   06/28/21 0932  TempSrc: Oral  PainSc: 0-No pain                 Marvelene Stoneberg C Nachmen Mansel
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
## 2021-06-28 NOTE — Op Note (Signed)
[facility] Patient Name: Julie Bean Procedure Date: 06/28/2021 8:44 AM MRN: [id number] Date of Birth: Sep 06, 1950 Attending MD: Norvel Richards , MD CSN: [id number] Age: 70 Admit Type: Outpatient Procedure:                Colonoscopy Indications:              High risk colon cancer surveillance: Personal                            history of colonic polyps Providers:                Norvel Richards, MD, Lurline Del, RN, Raphael Gibney, Technician Referring MD:              Medicines:                Propofol per Anesthesia Complications:            No immediate complications. Estimated Blood Loss:     Estimated blood loss: none. Procedure:                Pre-Anesthesia Assessment:                           - Prior to the procedure, a History and Physical                            was performed, and patient medications and                            allergies were reviewed. The patient's tolerance of                            previous anesthesia was also reviewed. The risks                            and benefits of the procedure and the sedation                            options and risks were discussed with the patient.                            All questions were answered, and informed consent                            was obtained. Prior Anticoagulants: The patient has                            taken no previous anticoagulant or antiplatelet                            agents. ASA Grade Assessment: III - A patient with  severe systemic disease. After reviewing the risks                            and benefits, the patient was deemed in                            satisfactory condition to undergo the procedure.                           After obtaining informed consent, the colonoscope                            was passed under direct vision. Throughout the                            procedure, the  patient's blood pressure, pulse, and                            oxygen saturations were monitored continuously. The                            [id number]) scope was introduced through the                            anus and advanced to the the cecum, identified by                            appendiceal orifice and ileocecal valve. The                            colonoscopy was performed without difficulty. The                            patient tolerated the procedure well. The quality                            of the bowel preparation was adequate. The                            ileocecal valve, appendiceal orifice, and rectum                            were photographed. The entire colon was well                            visualized. Scope In: 9:06:29 AM Scope Out: 9:25:04 AM Scope Withdrawal Time: 0 hours 6 minutes 35 seconds  Total Procedure Duration: 0 hours 18 minutes 35 seconds  Findings:      The perianal and digital rectal examinations were normal. Redundant       colon. External abdominal pressure required to reach the cecum.      Scattered medium-mouthed diverticula were found in the sigmoid colon and       descending colon.      The exam was otherwise without abnormality on direct  and retroflexion       views. Impression:               - Diverticulosis in the sigmoid colon and in the                            descending colon.                           - The examination was otherwise normal on direct                            and retroflexion views. Redundant colon.                           - No specimens collected. Moderate Sedation:      Moderate (conscious) sedation was personally administered by an       anesthesia professional. The following parameters were monitored: oxygen       saturation, heart rate, blood pressure, respiratory rate, EKG, adequacy       of pulmonary ventilation, and response to care. Recommendation:           - Patient has a contact  number available for                            emergencies. The signs and symptoms of potential                            delayed complications were discussed with the                            patient. Return to normal activities tomorrow.                            Written discharge instructions were provided to the                            patient.                           - Advance diet as tolerated.                           - Continue present medications.                           - Repeat colonoscopy in 5 years for surveillance.                           - Return to GI office (date not yet determined). Procedure Code(s):        --- Professional ---                           [id number], Colonoscopy, flexible; diagnostic, including                            collection of specimen(s)  by brushing or washing,                            when performed (separate procedure) Diagnosis Code(s):        --- Professional ---                           Z86.010, Personal history of colonic polyps                           K57.30, Diverticulosis of large intestine without                            perforation or abscess without bleeding CPT copyright 2019 [employer]. All rights reserved. The codes documented in this report are preliminary and upon coder review may  be revised to meet current compliance requirements. Julie Bean. Julie Swiney, MD Norvel Richards, MD 06/28/2021 9:37:04 AM This report has been signed electronically. Number of Addenda: 0

## 2021-06-28 NOTE — Transfer of Care (Signed)
Immediate Anesthesia Transfer of Care Note  Patient: Julie Bean  Procedure(s) Performed: COLONOSCOPY WITH PROPOFOL  Patient Location: PACU  Anesthesia Type:General  Level of Consciousness: awake, alert , oriented and patient cooperative  Airway & Oxygen Therapy: Patient Spontanous Breathing  Post-op Assessment: Report given to RN, Post -op Vital signs reviewed and stable and Patient moving all extremities X 4  Post vital signs: Reviewed and stable  Last Vitals:  Vitals Value Taken Time  BP    Temp    Pulse    Resp    SpO2      Last Pain:  Vitals:   06/28/21 0751  TempSrc: Oral  PainSc: 0-No pain         Complications: No notable events documented.

## 2021-06-28 NOTE — Discharge Instructions (Signed)
  Colonoscopy Discharge Instructions  Read the instructions outlined below and refer to this sheet in the next few weeks. These discharge instructions provide you with general information on caring for yourself after you leave the hospital. Your doctor may also give you specific instructions. While your treatment has been planned according to the most current medical practices available, unavoidable complications occasionally occur. If you have any problems or questions after discharge, call Dr. Gala Romney at [phone]. ACTIVITY You may resume your regular activity, but move at a slower pace for the next 24 hours.  Take frequent rest periods for the next 24 hours.  Walking will help get rid of the air and reduce the bloated feeling in your belly (abdomen).  No driving for 24 hours (because of the medicine (anesthesia) used during the test).   Do not sign any important legal documents or operate any machinery for 24 hours (because of the anesthesia used during the test).  NUTRITION Drink plenty of fluids.  You may resume your normal diet as instructed by your doctor.  Begin with a light meal and progress to your normal diet. Heavy or fried foods are harder to digest and may make you feel sick to your stomach (nauseated).  Avoid alcoholic beverages for 24 hours or as instructed.  MEDICATIONS You may resume your normal medications unless your doctor tells you otherwise.  WHAT YOU CAN EXPECT TODAY Some feelings of bloating in the abdomen.  Passage of more gas than usual.  Spotting of blood in your stool or on the toilet paper.  IF YOU HAD POLYPS REMOVED DURING THE COLONOSCOPY: No aspirin products for 7 days or as instructed.  No alcohol for 7 days or as instructed.  Eat a soft diet for the next 24 hours.  FINDING OUT THE RESULTS OF YOUR TEST Not all test results are available during your visit. If your test results are not back during the visit, make an appointment with your caregiver to find out the  results. Do not assume everything is normal if you have not heard from your caregiver or the medical facility. It is important for you to follow up on all of your test results.  SEEK IMMEDIATE MEDICAL ATTENTION IF: You have more than a spotting of blood in your stool.  Your belly is swollen (abdominal distention).  You are nauseated or vomiting.  You have a temperature over 101.  You have abdominal pain or discomfort that is severe or gets worse throughout the day.    Diverticulosis found but no polyps today.  I recommend a repeat colonoscopy in 5 years  At patient request, I called Lucy at [phone] -  reviewed results and recommendations

## 2021-06-28 NOTE — Anesthesia Preprocedure Evaluation (Signed)
Anesthesia Evaluation  Patient identified by MRN, date of birth, ID band Patient awake    Reviewed: Allergy & Precautions, NPO status , Patient's Chart, lab work & pertinent test results, reviewed documented beta blocker date and time   History of Anesthesia Complications (+) PONV and history of anesthetic complications  Airway Mallampati: III  TM Distance: >3 FB Neck ROM: Full    Dental  (+) Dental Advisory Given, Teeth Intact   Pulmonary neg pulmonary ROS,    Pulmonary exam normal breath sounds clear to auscultation       Cardiovascular Exercise Tolerance: Good hypertension, Pt. on medications and Pt. on home beta blockers Normal cardiovascular exam Rhythm:Regular Rate:Normal     Neuro/Psych  Headaches,    GI/Hepatic Neg liver ROS, GERD  Medicated and Controlled,  Endo/Other  diabetes, Well Controlled, Type 2, Oral Hypoglycemic AgentsHypothyroidism Morbid obesity  Renal/GU negative Renal ROS     Musculoskeletal  (+) Arthritis , Osteoarthritis,    Abdominal   Peds  Hematology negative hematology ROS (+)   Anesthesia Other Findings   Reproductive/Obstetrics                           Anesthesia Physical Anesthesia Plan  ASA: 3  Anesthesia Plan: General   Post-op Pain Management:    Induction: Intravenous  PONV Risk Score and Plan: TIVA  Airway Management Planned: Nasal Cannula and Natural Airway  Additional Equipment:   Intra-op Plan:   Post-operative Plan:   Informed Consent: I have reviewed the patients History and Physical, chart, labs and discussed the procedure including the risks, benefits and alternatives for the proposed anesthesia with the patient or authorized representative who has indicated his/her understanding and acceptance.     Dental advisory given  Plan Discussed with: CRNA and Surgeon  Anesthesia Plan Comments:         Anesthesia Quick  Evaluation

## 2021-07-02 ENCOUNTER — Encounter (HOSPITAL_COMMUNITY): Payer: Self-pay | Admitting: Internal Medicine
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
## 2021-07-04 ENCOUNTER — Ambulatory Visit (INDEPENDENT_AMBULATORY_CARE_PROVIDER_SITE_OTHER): Payer: Medicare Other | Admitting: Internal Medicine

## 2021-07-04 ENCOUNTER — Other Ambulatory Visit: Payer: Self-pay | Admitting: *Deleted

## 2021-07-04 ENCOUNTER — Other Ambulatory Visit: Payer: Self-pay

## 2021-07-04 ENCOUNTER — Encounter: Payer: Self-pay | Admitting: Internal Medicine

## 2021-07-04 VITALS — BP 132/74 | HR 82 | Ht 68.0 in | Wt 276.6 lb

## 2021-07-04 DIAGNOSIS — I509 Heart failure, unspecified: Secondary | ICD-10-CM

## 2021-07-04 MED ORDER — TORSEMIDE 20 MG PO TABS: 40.0000 mg | ORAL_TABLET | Freq: Every day | ORAL | 3 refills | Status: DC

## 2021-07-04 NOTE — Patient Instructions (Addendum)
Medication Instructions:  Your physician has recommended you make the following change in your medication:   STOP Lasix  STOP Hydrochlorothiazide  START Torsemide 20 mg taking 2 tablets daily    *If you need a refill on your cardiac medications before your next appointment, please call your pharmacy*   Lab Work: IN 1 [place] LABWORK:  BMP, MAG, & BNP  If you have labs (blood work) drawn today and your tests are completely normal, you will receive your results only by: [place] (if you have MyChart) OR A paper copy in the mail If you have any lab test that is abnormal or we need to change your treatment, we will call you to review the results.   Testing/Procedures: Your physician has requested that you have an echocardiogram. Echocardiography is a painless test that uses sound waves to create images of your heart. It provides your doctor with information about the size and shape of your heart and how well your heart's chambers and valves are working. This procedure takes approximately one hour. There are no restrictions for this procedure.    Follow-Up: At [facility], you and your health needs are our priority.  As part of our continuing mission to provide you with exceptional heart care, we have created designated Provider Care Teams.  These Care Teams include your primary Cardiologist (physician) and Advanced Practice Providers (APPs -  Physician Assistants and Nurse Practitioners) who all work together to provide you with the care you need, when you need it.  We recommend signing up for the patient portal called "MyChart".  Sign up information is provided on this After Visit Summary.  MyChart is used to connect with patients for Virtual Visits (Telemedicine).  Patients are able to view lab/test results, encounter notes, upcoming appointments, etc.  Non-urgent messages can be sent to your provider as well.   To learn more about what you can do with  MyChart, go to [email or web address].    Your next appointment:   6-8 WEEKS IN [place] [address] [place], [place] [address]   [phone]  The format for your next appointment:   In Person  Provider:   Dr. Gasper Sells   Other Instructions Echocardiogram An echocardiogram is a test that uses sound waves (ultrasound) to produce images of the heart. Images from an echocardiogram can provide important information about: Heart size and shape. The size and thickness and movement of your heart's walls. Heart muscle function and strength. Heart valve function or if you have stenosis. Stenosis is when the heart valves are too narrow. If blood is flowing backward through the heart valves (regurgitation). A tumor or infectious growth around the heart valves. Areas of heart muscle that are not working well because of poor blood flow or injury from a heart attack. Aneurysm detection. An aneurysm is a weak or damaged part of an artery wall. The wall bulges out from the normal force of blood pumping through the body. Tell a health care provider about: Any allergies you have. All medicines you are taking, including vitamins, herbs, eye drops, creams, and over-the-counter medicines. Any blood disorders you have. Any surgeries you have had. Any medical conditions you have. Whether you are pregnant or may be pregnant. What are the risks? Generally, this is a safe test. However, problems may occur, including an allergic reaction to dye (contrast) that may be used during the test. What happens before the  test? No specific preparation is needed. You may eat and drink normally. What happens during the test?  You will take off your clothes from the waist up and put on a hospital gown. Electrodes or electrocardiogram (ECG)patches may be placed on your chest. The electrodes or patches are then connected to a device that monitors your  heart rate and rhythm. You will lie down on a table for an ultrasound exam. A gel will be applied to your chest to help sound waves pass through your skin. A handheld device, called a transducer, will be pressed against your chest and moved over your heart. The transducer produces sound waves that travel to your heart and bounce back (or "echo" back) to the transducer. These sound waves will be captured in real-time and changed into images of your heart that can be viewed on a video monitor. The images will be recorded on a computer and reviewed by your health care provider. You may be asked to change positions or hold your breath for a short time. This makes it easier to get different views or better views of your heart. In some cases, you may receive contrast through an IV in one of your veins. This can improve the quality of the pictures from your heart. The procedure may vary among health care providers and hospitals. What can I expect after the test? You may return to your normal, everyday life, including diet, activities, and medicines, unless your health care provider tells you not to do that. Follow these instructions at home: It is up to you to get the results of your test. Ask your health care provider, or the department that is doing the test, when your results will be ready. Keep all follow-up visits. This is important. Summary An echocardiogram is a test that uses sound waves (ultrasound) to produce images of the heart. Images from an echocardiogram can provide important information about the size and shape of your heart, heart muscle function, heart valve function, and other possible heart problems. You do not need to do anything to prepare before this test. You may eat and drink normally. After the echocardiogram is completed, you may return to your normal, everyday life, unless your health care provider tells you not to do that. This information is not intended to replace advice given  to you by your health care provider. Make sure you discuss any questions you have with your health care provider. Document Revised: 04/10/2020 Document Reviewed: 04/10/2020 Elsevier Patient Education  2022 [employer].

## 2021-07-05 ENCOUNTER — Encounter: Payer: Self-pay | Admitting: Internal Medicine

## 2021-07-05 ENCOUNTER — Telehealth: Payer: Self-pay | Admitting: Internal Medicine

## 2021-07-05 ENCOUNTER — Ambulatory Visit (INDEPENDENT_AMBULATORY_CARE_PROVIDER_SITE_OTHER): Payer: Medicare Other | Admitting: Internal Medicine

## 2021-07-05 VITALS — BP 132/80 | HR 79 | Temp 97.9°F | Resp 18 | Ht 68.0 in | Wt 278.0 lb

## 2021-07-05 DIAGNOSIS — E871 Hypo-osmolality and hyponatremia: Secondary | ICD-10-CM | POA: Diagnosis not present

## 2021-07-05 DIAGNOSIS — M7989 Other specified soft tissue disorders: Secondary | ICD-10-CM

## 2021-07-05 DIAGNOSIS — I10 Essential (primary) hypertension: Secondary | ICD-10-CM | POA: Diagnosis not present

## 2021-07-05 DIAGNOSIS — E782 Mixed hyperlipidemia: Secondary | ICD-10-CM

## 2021-07-05 DIAGNOSIS — E1165 Type 2 diabetes mellitus with hyperglycemia: Secondary | ICD-10-CM

## 2021-07-05 MED ORDER — GLIPIZIDE ER 5 MG PO TB24: 5.0000 mg | ORAL_TABLET | Freq: Every day | ORAL | 1 refills | Status: DC

## 2021-07-05 MED ORDER — POTASSIUM CHLORIDE CRYS ER 20 MEQ PO TBCR
20.0000 meq | EXTENDED_RELEASE_TABLET | Freq: Every day | ORAL | 0 refills | Status: DC
Start: 2021-07-05 — End: 2021-07-19

## 2021-07-05 MED ORDER — RYBELSUS 3 MG PO TABS: 3.0000 mg | ORAL_TABLET | Freq: Every day | ORAL | 0 refills | Status: DC

## 2021-07-05 NOTE — Telephone Encounter (Signed)
Reviewed office visit and in note K was to be stopped and pt was informed of this Will forward to Dr Gasper Sells for review to make sure pt is to hold this./cy

## 2021-07-05 NOTE — Assessment & Plan Note (Signed)
Lab Results  Component Value Date   HGBA1C 6.8 (H) 06/04/2021   On Metformin and Glipizide Would prefer to add SGLT2i, but she states that it is not covered/can't afford Added Rybelsus for now On statin Reviewed CMP and lipid profile

## 2021-07-05 NOTE — Patient Instructions (Addendum)
Please start taking Rybelsus as prescribed.  Please take Ferrous sulphate 325 mg once daily.  Please start taking Torsemide instead of Lasix and HCTZ. Please get blood tests as scheduled with Cardiology clinic.
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
## 2021-07-05 NOTE — Assessment & Plan Note (Signed)
Appears to be chronic hyponatremia Could be due to Lasix and HCTZ used concurrently Going to switch to Torsemide BMP after 1 week

## 2021-07-05 NOTE — Telephone Encounter (Signed)
Pt c/o medication issue:  1. Name of Medication: potassium chloride SA (KLOR-CON) 20 MEQ tablet  2. How are you currently taking this medication (dosage and times per day)? Take 1 tablet (20 mEq total) by mouth daily.  3. Are you having a reaction (difficulty breathing--STAT)? no  4. What is your medication issue? Patient called in to see if she is supposed to still be talking the medication

## 2021-07-05 NOTE — Assessment & Plan Note (Signed)
BP Readings from Last 1 Encounters:  07/05/21 132/80   Well-controlled Counseled for compliance with the medications Advised DASH diet and moderate exercise/walking as tolerated

## 2021-07-05 NOTE — Assessment & Plan Note (Addendum)
Improved with Lasix, going to start Torsemide instead Followed by Cardiology - planned to get Echo

## 2021-07-08 NOTE — Telephone Encounter (Signed)
Pt aware of recommendations and verbalizes understanding ./cy 

## 2021-07-10 DIAGNOSIS — H353112 Nonexudative age-related macular degeneration, right eye, intermediate dry stage: Secondary | ICD-10-CM | POA: Diagnosis not present

## 2021-07-10 DIAGNOSIS — H35372 Puckering of macula, left eye: Secondary | ICD-10-CM | POA: Diagnosis not present

## 2021-07-10 DIAGNOSIS — E119 Type 2 diabetes mellitus without complications: Secondary | ICD-10-CM | POA: Diagnosis not present

## 2021-07-10 DIAGNOSIS — H2513 Age-related nuclear cataract, bilateral: Secondary | ICD-10-CM | POA: Diagnosis not present

## 2021-07-10 LAB — HM DIABETES EYE EXAM

## 2021-07-12 ENCOUNTER — Other Ambulatory Visit: Payer: Self-pay

## 2021-07-12 DIAGNOSIS — I509 Heart failure, unspecified: Secondary | ICD-10-CM | POA: Diagnosis not present

## 2021-07-13 LAB — BRAIN NATRIURETIC PEPTIDE: BNP: 14.6 pg/mL (ref 0.0–100.0)

## 2021-07-13 LAB — BASIC METABOLIC PANEL
BUN/Creatinine Ratio: 26 (ref 12–28)
BUN: 28 mg/dL — ABNORMAL HIGH (ref 8–27)
CO2: 24 mmol/L (ref 20–29)
Calcium: 9.4 mg/dL (ref 8.7–10.3)
Chloride: 93 mmol/L — ABNORMAL LOW (ref 96–106)
Creatinine, Ser: 1.06 mg/dL — ABNORMAL HIGH (ref 0.57–1.00)
Glucose: 173 mg/dL — ABNORMAL HIGH (ref 70–99)
Potassium: 4.7 mmol/L (ref 3.5–5.2)
Sodium: 134 mmol/L (ref 134–144)
eGFR: 57 mL/min/{1.73_m2} — ABNORMAL LOW (ref >59)

## 2021-07-13 LAB — SPECIMEN STATUS REPORT

## 2021-07-13 LAB — MAGNESIUM: Magnesium: 1.7 mg/dL (ref 1.6–2.3)

## 2021-07-16 ENCOUNTER — Other Ambulatory Visit: Payer: Self-pay

## 2021-07-16 ENCOUNTER — Encounter: Payer: Self-pay | Admitting: Nurse Practitioner

## 2021-07-16 ENCOUNTER — Ambulatory Visit (HOSPITAL_COMMUNITY): Payer: Medicare Other | Attending: Cardiovascular Disease

## 2021-07-16 DIAGNOSIS — I509 Heart failure, unspecified: Secondary | ICD-10-CM | POA: Diagnosis not present

## 2021-07-16 LAB — ECHOCARDIOGRAM COMPLETE
Area-P 1/2: 3.77 cm2
S' Lateral: 3.7 cm

## 2021-07-17 ENCOUNTER — Telehealth: Payer: Self-pay

## 2021-07-17 ENCOUNTER — Encounter: Payer: Self-pay | Admitting: Nurse Practitioner

## 2021-07-17 DIAGNOSIS — Z23 Encounter for immunization: Secondary | ICD-10-CM | POA: Diagnosis not present

## 2021-07-17 DIAGNOSIS — I509 Heart failure, unspecified: Secondary | ICD-10-CM

## 2021-07-17 NOTE — Telephone Encounter (Signed)
-----   Message from Precious Gilding, RN sent at 07/17/2021  3:12 PM EST -----  ----- Message ----- From: Werner Lean, MD Sent: 07/17/2021   2:54 PM EST To: Precious Gilding, RN  Results: Mild TAA dilation Plan: Echo in one year, continue diuertic  Werner Lean, MD

## 2021-07-17 NOTE — Telephone Encounter (Signed)
Echo ordered for 1 year

## 2021-07-19 ENCOUNTER — Telehealth: Payer: Self-pay

## 2021-07-19 DIAGNOSIS — Z20828 Contact with and (suspected) exposure to other viral communicable diseases: Secondary | ICD-10-CM | POA: Diagnosis not present

## 2021-07-19 DIAGNOSIS — M7989 Other specified soft tissue disorders: Secondary | ICD-10-CM

## 2021-07-19 MED ORDER — POTASSIUM CHLORIDE CRYS ER 20 MEQ PO TBCR: 20.0000 meq | EXTENDED_RELEASE_TABLET | Freq: Every day | ORAL | 3 refills | Status: DC

## 2021-07-19 NOTE — Telephone Encounter (Signed)
Medication refill request for potassium chloride 20 mEq tablets approved and sent to [facility] per pt request.

## 2021-08-13 DIAGNOSIS — Z20822 Contact with and (suspected) exposure to covid-19: Secondary | ICD-10-CM | POA: Diagnosis not present

## 2021-08-13 DIAGNOSIS — E118 Type 2 diabetes mellitus with unspecified complications: Secondary | ICD-10-CM | POA: Diagnosis not present

## 2021-08-17 ENCOUNTER — Ambulatory Visit
Admission: RE | Admit: 2021-08-17 | Discharge: 2021-08-17 | Disposition: A | Discharge status: Home / Self Care | Payer: Medicare Other | Source: Ambulatory Visit | Attending: Family Medicine | Admitting: Family Medicine

## 2021-08-17 ENCOUNTER — Other Ambulatory Visit: Payer: Self-pay

## 2021-08-17 VITALS — BP 133/82 | HR 74 | Temp 97.9°F | Resp 14

## 2021-08-17 DIAGNOSIS — Z20822 Contact with and (suspected) exposure to covid-19: Secondary | ICD-10-CM

## 2021-08-17 DIAGNOSIS — R509 Fever, unspecified: Secondary | ICD-10-CM | POA: Diagnosis not present

## 2021-08-17 DIAGNOSIS — J069 Acute upper respiratory infection, unspecified: Secondary | ICD-10-CM

## 2021-08-17 MED ORDER — PROMETHAZINE-DM 6.25-15 MG/5ML PO SYRP: 5.0000 mL | ORAL_SOLUTION | Freq: Four times a day (QID) | ORAL | 0 refills | Status: DC | PRN

## 2021-08-17 MED ORDER — MOLNUPIRAVIR EUA 200MG CAPSULE: 4.0000 | ORAL_CAPSULE | Freq: Two times a day (BID) | ORAL | 0 refills | Status: AC

## 2021-08-17 NOTE — ED Triage Notes (Signed)
Pt presents with cough, congestion, fever, diarrhea, and sore throat that began Thursday. Pt has positive covid exposure. Negative home covid test
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
## 2021-08-19 LAB — COVID-19, FLU A+B NAA
Influenza A, NAA: NOT DETECTED
Influenza B, NAA: NOT DETECTED
SARS-CoV-2, NAA: NOT DETECTED

## 2021-09-04 ENCOUNTER — Ambulatory Visit: Payer: Medicare Other | Admitting: Nutrition
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
## 2021-09-11 ENCOUNTER — Other Ambulatory Visit: Payer: Self-pay

## 2021-09-11 ENCOUNTER — Ambulatory Visit (INDEPENDENT_AMBULATORY_CARE_PROVIDER_SITE_OTHER): Payer: Medicare Other | Admitting: Internal Medicine

## 2021-09-11 ENCOUNTER — Encounter: Payer: Self-pay | Admitting: Internal Medicine

## 2021-09-11 VITALS — BP 126/68 | HR 84 | Ht 68.0 in | Wt 273.0 lb

## 2021-09-11 DIAGNOSIS — I7121 Aneurysm of the ascending aorta, without rupture: Secondary | ICD-10-CM

## 2021-09-11 DIAGNOSIS — E785 Hyperlipidemia, unspecified: Secondary | ICD-10-CM

## 2021-09-11 DIAGNOSIS — E1159 Type 2 diabetes mellitus with other circulatory complications: Secondary | ICD-10-CM | POA: Diagnosis not present

## 2021-09-11 DIAGNOSIS — I5032 Chronic diastolic (congestive) heart failure: Secondary | ICD-10-CM | POA: Diagnosis not present

## 2021-09-11 DIAGNOSIS — I152 Hypertension secondary to endocrine disorders: Secondary | ICD-10-CM

## 2021-09-11 DIAGNOSIS — E1169 Type 2 diabetes mellitus with other specified complication: Secondary | ICD-10-CM | POA: Diagnosis not present

## 2021-09-11 DIAGNOSIS — I5033 Acute on chronic diastolic (congestive) heart failure: Secondary | ICD-10-CM | POA: Insufficient documentation

## 2021-09-11 NOTE — Patient Instructions (Signed)
Medication Instructions:  Your physician recommends that you continue on your current medications as directed. Please refer to the Current Medication list given to you today. Dr. Gasper Sells will discuss with our pharmacist a medication to help with heart failure.  We will call you once medication decision has been made.    *If you need a refill on your cardiac medications before your next appointment, please call your pharmacy*   Lab Work: NONE If you have labs (blood work) drawn today and your tests are completely normal, you will receive your results only by: [place] (if you have MyChart) OR A paper copy in the mail If you have any lab test that is abnormal or we need to change your treatment, we will call you to review the results.   Testing/Procedures: NONE   Follow-Up: At [facility], you and your health needs are our priority.  As part of our continuing mission to provide you with exceptional heart care, we have created designated Provider Care Teams.  These Care Teams include your primary Cardiologist (physician) and Advanced Practice Providers (APPs -  Physician Assistants and Nurse Practitioners) who all work together to provide you with the care you need, when you need it.    Your next appointment:   6 month(s)  The format for your next appointment:   In Person  Provider:   Werner Lean, MD

## 2021-09-12 ENCOUNTER — Telehealth: Payer: Self-pay | Admitting: Pharmacist

## 2021-09-12 DIAGNOSIS — I5032 Chronic diastolic (congestive) heart failure: Secondary | ICD-10-CM

## 2021-09-12 NOTE — Telephone Encounter (Signed)
Received message from Dr. Gasper Sells about assisting with getting patient Julie Bean. Copay is ~ $300. Called pt to talk about apply for patient assistance for [place] or Farxiga. Left VM for pt to call back

## 2021-09-13 NOTE — Telephone Encounter (Signed)
Spoke with patient about patient asssitance for [place]. Provided her with directions on how to apply. She will fill the [place] application online. She will fill out the [place] application plus her proof of income. She will drop it off at the [place] office and ask them to fax to me.

## 2021-09-25 NOTE — Telephone Encounter (Signed)
Pt dropped off AZ&ME application in [place] office to have faxed to [facility]. All forms faxed to [phone].

## 2021-09-26 NOTE — Telephone Encounter (Signed)
Received applications. Signed by MD and faxed today 09/23/21 to [facility] cares and AZ&ME. Called pt to let her know its been submitted.

## 2021-09-30 NOTE — Telephone Encounter (Signed)
Received fax from [facility] cares that patient exceeds income limits for pt assistance.  Still waiting to hear from AZ&ME. Pt made aware

## 2021-10-02 NOTE — Addendum Note (Signed)
Addended by: Marcelle Overlie D on: 10/02/2021 11:55 AM   Modules accepted: Orders

## 2021-10-02 NOTE — Telephone Encounter (Signed)
Patient approved for pt assistance for Farxgia through 08/31/22 Pt made aware. Medication will be shipped to patient. Advised she is to take 1 tablet by mouth prior to breakfast. She will go to [facility] for lab work in 4 weeks after stating medication. Farxiga 10mg  daily added to her med list.

## 2021-10-15 DIAGNOSIS — M1612 Unilateral primary osteoarthritis, left hip: Secondary | ICD-10-CM | POA: Diagnosis not present

## 2021-10-15 DIAGNOSIS — M47816 Spondylosis without myelopathy or radiculopathy, lumbar region: Secondary | ICD-10-CM | POA: Diagnosis not present

## 2021-11-05 ENCOUNTER — Ambulatory Visit (INDEPENDENT_AMBULATORY_CARE_PROVIDER_SITE_OTHER): Payer: Medicare Other | Admitting: Nurse Practitioner

## 2021-11-05 ENCOUNTER — Other Ambulatory Visit: Payer: Self-pay

## 2021-11-05 ENCOUNTER — Encounter: Payer: Self-pay | Admitting: Nurse Practitioner

## 2021-11-05 VITALS — BP 131/79 | HR 87 | Ht 68.0 in | Wt 263.0 lb

## 2021-11-05 DIAGNOSIS — E1165 Type 2 diabetes mellitus with hyperglycemia: Secondary | ICD-10-CM

## 2021-11-05 DIAGNOSIS — E785 Hyperlipidemia, unspecified: Secondary | ICD-10-CM

## 2021-11-05 DIAGNOSIS — E1159 Type 2 diabetes mellitus with other circulatory complications: Secondary | ICD-10-CM | POA: Diagnosis not present

## 2021-11-05 DIAGNOSIS — E039 Hypothyroidism, unspecified: Secondary | ICD-10-CM | POA: Diagnosis not present

## 2021-11-05 DIAGNOSIS — I10 Essential (primary) hypertension: Secondary | ICD-10-CM

## 2021-11-05 DIAGNOSIS — E782 Mixed hyperlipidemia: Secondary | ICD-10-CM | POA: Diagnosis not present

## 2021-11-05 DIAGNOSIS — E1169 Type 2 diabetes mellitus with other specified complication: Secondary | ICD-10-CM | POA: Diagnosis not present

## 2021-11-05 DIAGNOSIS — I152 Hypertension secondary to endocrine disorders: Secondary | ICD-10-CM | POA: Diagnosis not present

## 2021-11-05 NOTE — Progress Notes (Signed)
? ?  Julie Bean     MRN: [id number]      DOB: [birth year] ? ? ?HPI ?[name] with medical history of hypertension, hyperlipidemia, type 2 diabetes with hyperglycemia, hypothyroidism is here for follow up for her chronic medical conditions  ? ?The PT denies any adverse reactions to current medications since the last visit.  ?There are no new concerns.  ?There are no specific complaints  ? ?Takes amlodipine $RemoveBeforeDEI'5mg'[id number]$  daily, metoprolol $RemoveBeforeDEI'25mg'qTbguVjwuYLkigQG$  daily, torsemide $RemoveBeforeDE'20mg'sUmeYlBaQnpyObw$  daily for hypertension.  Patient denies chest pain, dizziness, hypotension ? ?[place] recently prescribed by cardiology for  early HF. Started taking faxiga on October 23, 2021 ? ?Had her diabetic eye exam done 3 months ago at [facility],  sees them twice a year. Goes to the podiarty twice a year for foot exam .  ? ?Patient is due for shingles vaccine patient advised to get her shingles vaccine at her pharmacy she verbalized understanding ? ?ROS ?Denies recent fever or chills. ?Denies sinus pressure, nasal congestion, ear pain or sore throat. ?Denies chest congestion, productive cough or wheezing. ?Denies chest pains, palpitations and leg swelling ?Denies abdominal pain, nausea, vomiting,diarrhea or constipation.   ?Denies dysuria, frequency, hesitancy or incontinence. ?Denies joint pain, swelling and limitation in mobility. ?Denies headaches, seizures, numbness, or tingling. ?Denies depression, anxiety or insomnia. ? ? ? ?PE ? ?BP 131/79 (BP Location: Right Arm, Patient Position: Sitting, Cuff Size: Large)   Pulse 87   Ht $R'5\' 8"'ys$  (1.727 m)   Wt 263 lb (119.3 kg)   SpO2 96%   BMI 39.99 kg/m?  ? ?Patient alert and oriented and in no cardiopulmonary distress. ? ? ?Chest: Clear to auscultation bilaterally. ? ?CVS: S1, S2 no murmurs, no S3.Regular rate. ? ?ABD: Soft non tender.  ? ?MS: Adequate ROM spine, shoulders, hips and knees. ? ?Skin: Intact, no ulcerations or rash noted. ? ?Psych: Good eye contact, normal affect. Memory intact not anxious or  depressed appearing. ? ?CNS: CN 2-12 intact, power,  normal throughout.no focal deficits noted. ? ? ?Assessment & Plan ?Hypertension associated with diabetes ([place]) ?BP Readings from Last 3 Encounters:  ?11/05/21 131/79  ?09/11/21 126/68  ?08/17/21 133/82  ?Condition well-controlled, takes amlodipine 5 mg daily, metoprolol 25 mg daily, torsemide 20 mg daily. ?CMP plus EGFR ordered today ?Continue current medications ? ?Type 2 diabetes mellitus with hyperglycemia (HCC) ?Lab Results  ?Component Value Date  ? HGBA1C 6.8 (H) 06/04/2021  ? ?Takes glipizide 5 mg daily, metformin 500 mg 3 times daily.  Recently started taking Farxiga 10 mg daily for heart failure. ?Patient denies hypoglycemia. ?A1c ordered today ?Foot exam completed today ?Patient stated that she had a diabetic eye exam 3 months ago will obtain records from her ophthalmologist. ? ?Hypothyroidism ?Takes levothyroxine 175 mcg daily ?TSH T4 labs ordered today. ? ? ?Hyperlipidemia associated with type 2 diabetes mellitus ([place]) ?Not on statin. ?Lipid panel ordered today ?Lab Results  ?Component Value Date  ? CHOL 162 06/04/2021  ? HDL 45 06/04/2021  ? [place] 76 06/04/2021  ? TRIG 252 (H) 06/04/2021  ? ? ? ?

## 2021-11-05 NOTE — Assessment & Plan Note (Addendum)
Not on statin. ?Lipid panel ordered today ?Lab Results  ?Component Value Date  ? CHOL 162 06/04/2021  ? HDL 45 06/04/2021  ? [place] 76 06/04/2021  ? TRIG 252 (H) 06/04/2021  ? ?

## 2021-11-05 NOTE — Assessment & Plan Note (Signed)
Lab Results  ?Component Value Date  ? HGBA1C 6.8 (H) 06/04/2021  ? ?Takes glipizide 5 mg daily, metformin 500 mg 3 times daily.  Recently started taking Farxiga 10 mg daily for heart failure. ?Patient denies hypoglycemia. ?A1c ordered today ?Foot exam completed today ?Patient stated that she had a diabetic eye exam 3 months ago will obtain records from her ophthalmologist. ?

## 2021-11-05 NOTE — Patient Instructions (Signed)
Please get your fasting labs done as discussed.  ?Please get your shingles vaccine at your pharmacy.  ? ?It is important that you exercise regularly at least 30 minutes 5 times a week.  ?Think about what you will eat, plan ahead. ?Choose " clean, green, fresh or frozen" over canned, processed or packaged foods which are more sugary, salty and fatty. ?70 to 75% of food eaten should be vegetables and fruit. ?Three meals at set times with snacks allowed between meals, but they must be fruit or vegetables. ?Aim to eat over a 12 hour period , example 7 am to 7 pm, and STOP after  your last meal of the day. ?Drink water,generally about 64 ounces per day, no other drink is as healthy. Fruit juice is best enjoyed in a healthy way, by EATING the fruit. ? ?Thanks for choosing [facility], we consider it a privelige to serve you.  ?

## 2021-11-05 NOTE — Assessment & Plan Note (Signed)
BP Readings from Last 3 Encounters:  ?11/05/21 131/79  ?09/11/21 126/68  ?08/17/21 133/82  ?Condition well-controlled, takes amlodipine 5 mg daily, metoprolol 25 mg daily, torsemide 20 mg daily. ?CMP plus EGFR ordered today ?Continue current medications ?

## 2021-11-05 NOTE — Assessment & Plan Note (Signed)
Takes levothyroxine 175 mcg daily ?TSH T4 labs ordered today. ? ?

## 2021-11-07 ENCOUNTER — Encounter: Payer: Self-pay | Admitting: Nurse Practitioner

## 2021-11-08 ENCOUNTER — Encounter: Payer: Self-pay | Admitting: Nurse Practitioner

## 2021-11-19 ENCOUNTER — Other Ambulatory Visit: Payer: Self-pay

## 2021-11-19 ENCOUNTER — Ambulatory Visit (INDEPENDENT_AMBULATORY_CARE_PROVIDER_SITE_OTHER): Payer: Medicare Other | Admitting: Family Medicine

## 2021-11-19 DIAGNOSIS — Z0000 Encounter for general adult medical examination without abnormal findings: Secondary | ICD-10-CM | POA: Diagnosis not present
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
## 2021-11-19 NOTE — Patient Instructions (Signed)
?  Julie Bean , ?Thank you for taking time to come for your Medicare Wellness Visit. I appreciate your ongoing commitment to your health goals. Please review the following plan we discussed and let me know if I can assist you in the future.  ? ?These are the goals we discussed: ? Goals   ? ?  DIET - EAT MORE FRUITS AND VEGETABLES   ?  Wants to manage her diabetes better. ?  ? ?  ?  ?This is a list of the screening recommended for you and due dates:  ?Health Maintenance  ?Topic Date Due  ? Hepatitis C Screening: USPSTF Recommendation to screen - Ages 74-79 yo.  Never done  ? Zoster (Shingles) Vaccine (1 of 2) Never done  ? COVID-19 Vaccine (4 - Booster for Moderna series) 09/11/2021  ? Pneumonia Vaccine (2 - PPSV23 if available, else PCV20) 12/07/2021  ? Hemoglobin A1C  12/03/2021  ? Eye exam for diabetics  07/10/2022  ? Complete foot exam   11/06/2022  ? Mammogram  03/30/2023  ? Tetanus Vaccine  12/08/2030  ? Colon Cancer Screening  06/29/2031  ? Flu Shot  Completed  ? DEXA scan (bone density measurement)  Completed  ? HPV Vaccine  Aged Out  ?  ?

## 2021-11-25 DIAGNOSIS — Z20822 Contact with and (suspected) exposure to covid-19: Secondary | ICD-10-CM | POA: Diagnosis not present

## 2021-12-04 DIAGNOSIS — Z20822 Contact with and (suspected) exposure to covid-19: Secondary | ICD-10-CM | POA: Diagnosis not present

## 2021-12-05 ENCOUNTER — Telehealth: Payer: Self-pay | Admitting: Pharmacist

## 2021-12-05 DIAGNOSIS — I5032 Chronic diastolic (congestive) heart failure: Secondary | ICD-10-CM

## 2021-12-05 NOTE — Telephone Encounter (Signed)
Patient needs BMP done. Started SGLT2 about a month ago. She has labs ordered by PCP but not drawn. Had also ordered labs to be drawn at [facility]. Called to to f/u to get labs drawn. LVM ?

## 2021-12-10 DIAGNOSIS — Z20822 Contact with and (suspected) exposure to covid-19: Secondary | ICD-10-CM | POA: Diagnosis not present

## 2021-12-10 NOTE — Telephone Encounter (Signed)
Patient will go to the lab this week for BMP ?

## 2021-12-11 ENCOUNTER — Other Ambulatory Visit (HOSPITAL_COMMUNITY)
Admission: RE | Admit: 2021-12-11 | Discharge: 2021-12-11 | Disposition: A | Discharge status: Home / Self Care | Payer: Medicare Other | Source: Ambulatory Visit | Attending: Internal Medicine | Admitting: Internal Medicine

## 2021-12-11 DIAGNOSIS — Z20822 Contact with and (suspected) exposure to covid-19: Secondary | ICD-10-CM | POA: Diagnosis not present

## 2021-12-11 DIAGNOSIS — I5032 Chronic diastolic (congestive) heart failure: Secondary | ICD-10-CM | POA: Insufficient documentation

## 2021-12-11 LAB — BASIC METABOLIC PANEL
Anion gap: 12 (ref 5–15)
BUN: 39 mg/dL — ABNORMAL HIGH (ref 8–23)
CO2: 27 mmol/L (ref 22–32)
Calcium: 9.2 mg/dL (ref 8.9–10.3)
Chloride: 96 mmol/L — ABNORMAL LOW (ref 98–111)
Creatinine, Ser: 1.25 mg/dL — ABNORMAL HIGH (ref 0.44–1.00)
GFR, Estimated: 46 mL/min — ABNORMAL LOW (ref >60)
Glucose, Bld: 185 mg/dL — ABNORMAL HIGH (ref 70–99)
Potassium: 5.1 mmol/L (ref 3.5–5.1)
Sodium: 135 mmol/L (ref 135–145)

## 2021-12-19 DIAGNOSIS — Z20822 Contact with and (suspected) exposure to covid-19: Secondary | ICD-10-CM | POA: Diagnosis not present

## 2022-01-06 DIAGNOSIS — Z20822 Contact with and (suspected) exposure to covid-19: Secondary | ICD-10-CM | POA: Diagnosis not present

## 2022-01-09 DIAGNOSIS — Z20822 Contact with and (suspected) exposure to covid-19: Secondary | ICD-10-CM | POA: Diagnosis not present

## 2022-01-09 DIAGNOSIS — R059 Cough, unspecified: Secondary | ICD-10-CM | POA: Diagnosis not present

## 2022-01-09 DIAGNOSIS — R051 Acute cough: Secondary | ICD-10-CM | POA: Diagnosis not present

## 2022-01-15 DIAGNOSIS — H40052 Ocular hypertension, left eye: Secondary | ICD-10-CM | POA: Diagnosis not present

## 2022-01-16 DIAGNOSIS — M79671 Pain in right foot: Secondary | ICD-10-CM | POA: Diagnosis not present

## 2022-01-16 DIAGNOSIS — S9031XA Contusion of right foot, initial encounter: Secondary | ICD-10-CM | POA: Diagnosis not present

## 2022-02-11 DIAGNOSIS — E118 Type 2 diabetes mellitus with unspecified complications: Secondary | ICD-10-CM | POA: Diagnosis not present

## 2022-02-11 DIAGNOSIS — S9031XD Contusion of right foot, subsequent encounter: Secondary | ICD-10-CM | POA: Diagnosis not present

## 2022-03-07 ENCOUNTER — Ambulatory Visit (INDEPENDENT_AMBULATORY_CARE_PROVIDER_SITE_OTHER): Payer: Medicare Other | Admitting: Nurse Practitioner

## 2022-03-07 ENCOUNTER — Encounter: Payer: Self-pay | Admitting: Nurse Practitioner

## 2022-03-07 VITALS — BP 130/75 | HR 78 | Ht 68.0 in | Wt 261.0 lb

## 2022-03-07 DIAGNOSIS — M4810 Ankylosing hyperostosis [Forestier], site unspecified: Secondary | ICD-10-CM

## 2022-03-07 DIAGNOSIS — R251 Tremor, unspecified: Secondary | ICD-10-CM

## 2022-03-07 DIAGNOSIS — E782 Mixed hyperlipidemia: Secondary | ICD-10-CM

## 2022-03-07 DIAGNOSIS — G4451 Hemicrania continua: Secondary | ICD-10-CM | POA: Diagnosis not present

## 2022-03-07 DIAGNOSIS — I152 Hypertension secondary to endocrine disorders: Secondary | ICD-10-CM | POA: Diagnosis not present

## 2022-03-07 DIAGNOSIS — E039 Hypothyroidism, unspecified: Secondary | ICD-10-CM

## 2022-03-07 DIAGNOSIS — E1165 Type 2 diabetes mellitus with hyperglycemia: Secondary | ICD-10-CM | POA: Diagnosis not present

## 2022-03-07 DIAGNOSIS — E1159 Type 2 diabetes mellitus with other circulatory complications: Secondary | ICD-10-CM

## 2022-03-07 NOTE — Progress Notes (Signed)
Julie Bean     MRN: [id number]      DOB: 1951/07/14   HPI Julie Bean with past medical history of hypertension associated with diabetes, hemicrania continua, CHF, type 2 diabetes, hypothyroidism, DISH is here for follow up and re-evaluation of chronic medical conditions, medication management.   Pt c/o hand tremors on and off for the past 3 months does not drink alcohol, has no family history of Parkinson, seizures, her niece has essential tremors, tremors does not interfer with her daily activities. She does have DISH which makes her have gait instability sometimes. She takes depakote '500mg'$  TID pain indometacin 50 mg BID for hemicrania continua, states that her headache is pretty much well controlled.    Due for screening mammogram, patient stated that she will call to make an appointment.  Due for pneumonia and shingles vaccine need for both vaccines discussed with patient patient encouraged to get vaccines at her pharmacy     ROS Denies recent fever or chills. Denies sinus pressure, nasal congestion, ear pain or sore throat. Denies chest congestion, productive cough or wheezing. Denies chest pains, palpitations and leg swelling Denies abdominal pain, nausea, vomiting,diarrhea or constipation.   Denies dysuria, frequency, hesitancy or incontinence. Denies headaches, seizures, numbness, or tingling. Denies depression, anxiety or insomnia.    PE  BP 130/75 (BP Location: Right Arm, Patient Position: Sitting, Cuff Size: Large)   Pulse 78   Ht '5\' 8"'$  (1.727 m)   Wt 261 lb (118.4 kg)   SpO2 96%   BMI 39.68 kg/m   Patient alert and oriented and in no cardiopulmonary distress.   Chest: Clear to auscultation bilaterally.  CVS: S1, S2 no murmurs, no S3.Regular rate.  ABD: Soft non tender.   Ext: No edema  MS: decreased ROM spine, shoulders, hips.  Skin: Intact, no ulcerations or rash noted.  Psych: Good eye contact, normal affect. Memory intact not anxious or  depressed appearing.  CNS:  power intact ,  normal throughout.no focal deficits noted.   Assessment & Plan  Hypertension associated with diabetes ([place]) BP Readings from Last 3 Encounters:  03/07/22 130/75  11/05/21 131/79  09/11/21 126/68  Chronic condition well-controlled Currently taking amlodipine 5 mg daily, metoprolol 25 mg daily torsemide 40 mg daily Continue current medications DASH diet advised engage in regular walking exercises as tolerated CMP today Follow-up in 4 months  Hemicrania continua Chronic condition well-controlled on Depakote 500 mg 3 times daily, indometacin 50 mg twice daily Patient complain of intermittent tremors that started about 3 months ago We will check Depakote levels today I discussed with patient that tremors is one of the side effects of Depakote   Type 2 diabetes mellitus with hyperglycemia ([facility]) Currently on Farxiga 10 mg daily, glipizide 5 mg daily, metformin 500 mg 3 times daily Check A1c, urine creatinine labs Avoid sugar sweets soda  Hypothyroidism Chronic condition currently on levothyroxine 175 mg daily Check TSH levels Lab Results  Component Value Date   TSH 2.280 06/04/2021    DISH (diffuse idiopathic skeletal hyperostosis) Chronic condition followed by orthopedics Patient encouraged to use walker to help prevent falls due to her gait instability, she prefers to follow up with otho first before making the decision to use a walker.  Need to prevent falls including risk of fractures discussed  Mixed hyperlipidemia Currently on rosuvastatin 10 mg daily Check lipid panel  Occasional tremors Tremors could be a side effect of depakote , taking depakote '500mg'$  TID checking Depakote level today Currently  on metoprolol 25 mg daily Will refer to neurologist if her symptoms does not improve

## 2022-03-07 NOTE — Assessment & Plan Note (Signed)
Currently on Farxiga 10 mg daily, glipizide 5 mg daily, metformin 500 mg 3 times daily Check A1c, urine creatinine labs Avoid sugar sweets soda

## 2022-03-07 NOTE — Assessment & Plan Note (Signed)
Chronic condition well-controlled on Depakote 500 mg 3 times daily, indometacin 50 mg twice daily Patient complain of intermittent tremors that started about 3 months ago We will check Depakote levels today I discussed with patient that tremors is one of the side effects of Depakote

## 2022-03-07 NOTE — Assessment & Plan Note (Signed)
BP Readings from Last 3 Encounters:  03/07/22 130/75  11/05/21 131/79  09/11/21 126/68  Chronic condition well-controlled Currently taking amlodipine 5 mg daily, metoprolol 25 mg daily torsemide 40 mg daily Continue current medications DASH diet advised engage in regular walking exercises as tolerated CMP today Follow-up in 4 months

## 2022-03-07 NOTE — Assessment & Plan Note (Signed)
Chronic condition followed by orthopedics Patient encouraged to use walker to help prevent falls due to her gait instability, she prefers to follow up with otho first before making the decision to use a walker.  Need to prevent falls including risk of fractures discussed

## 2022-03-07 NOTE — Assessment & Plan Note (Signed)
Tremors could be a side effect of depakote , taking depakote '500mg'$  TID checking Depakote level today Currently on metoprolol 25 mg daily Will refer to neurologist if her symptoms does not improve

## 2022-03-07 NOTE — Assessment & Plan Note (Addendum)
Chronic condition currently on levothyroxine 175 mg daily Check TSH levels Lab Results  Component Value Date   TSH 2.280 06/04/2021

## 2022-03-07 NOTE — Patient Instructions (Addendum)
Please get your shingles vaccine and pneumonia vaccine at your pharmacy.    It is important that you exercise regularly at least 30 minutes 5 times a week.  Think about what you will eat, plan ahead. Choose " clean, green, fresh or frozen" over canned, processed or packaged foods which are more sugary, salty and fatty. 70 to 75% of food eaten should be vegetables and fruit. Three meals at set times with snacks allowed between meals, but they must be fruit or vegetables. Aim to eat over a 12 hour period , example 7 am to 7 pm, and STOP after  your last meal of the day. Drink water,generally about 64 ounces per day, no other drink is as healthy. Fruit juice is best enjoyed in a healthy way, by EATING the fruit.  Thanks for choosing [facility], we consider it a privelige to serve you.

## 2022-03-07 NOTE — Assessment & Plan Note (Signed)
Currently on rosuvastatin 10 mg daily Check lipid panel

## 2022-03-12 DIAGNOSIS — R251 Tremor, unspecified: Secondary | ICD-10-CM | POA: Diagnosis not present

## 2022-03-12 DIAGNOSIS — E039 Hypothyroidism, unspecified: Secondary | ICD-10-CM | POA: Diagnosis not present

## 2022-03-12 DIAGNOSIS — E1165 Type 2 diabetes mellitus with hyperglycemia: Secondary | ICD-10-CM | POA: Diagnosis not present

## 2022-03-12 DIAGNOSIS — E782 Mixed hyperlipidemia: Secondary | ICD-10-CM | POA: Diagnosis not present

## 2022-03-12 DIAGNOSIS — G4451 Hemicrania continua: Secondary | ICD-10-CM | POA: Diagnosis not present

## 2022-03-13 ENCOUNTER — Other Ambulatory Visit: Payer: Self-pay | Admitting: Nurse Practitioner

## 2022-03-13 MED ORDER — GLIPIZIDE ER 10 MG PO TB24: 10.0000 mg | ORAL_TABLET | Freq: Every day | ORAL | 0 refills | Status: DC

## 2022-03-13 NOTE — Progress Notes (Signed)
Kidney function is slightly decreased , pt should drink at least 64 ounces of water to stay hydrated avoid ibuprofen, aleeve

## 2022-03-13 NOTE — Progress Notes (Signed)
A1C worse from 6.8 to 8.6. pt should start taking glipizide '10mg'$  daily, continue current dose of metformin and faxiga , avoid sugar sweets, soda   Triglyceride is high take OTC fish oil '1000mg'$  BID , avoid fatty fried foods   Valporic acid labs, urine micro albumin pending

## 2022-03-14 LAB — CBC WITH DIFFERENTIAL/PLATELET
Basophils Absolute: 0.1 10*3/uL (ref 0.0–0.2)
Basos: 2 %
EOS (ABSOLUTE): 0.3 10*3/uL (ref 0.0–0.4)
Eos: 5 %
Hematocrit: 39.5 % (ref 34.0–46.6)
Hemoglobin: 12.7 g/dL (ref 11.1–15.9)
Immature Grans (Abs): 0.1 10*3/uL (ref 0.0–0.1)
Immature Granulocytes: 1 %
Lymphocytes Absolute: 2.5 10*3/uL (ref 0.7–3.1)
Lymphs: 38 %
MCH: 28.2 pg (ref 26.6–33.0)
MCHC: 32.2 g/dL (ref 31.5–35.7)
MCV: 88 fL (ref 79–97)
Monocytes Absolute: 0.6 10*3/uL (ref 0.1–0.9)
Monocytes: 9 %
Neutrophils Absolute: 3 10*3/uL (ref 1.4–7.0)
Neutrophils: 45 %
Platelets: 234 10*3/uL (ref 150–450)
RBC: 4.51 x10E6/uL (ref 3.77–5.28)
RDW: 15 % (ref 11.7–15.4)
WBC: 6.6 10*3/uL (ref 3.4–10.8)

## 2022-03-14 LAB — LIPID PANEL
Chol/HDL Ratio: 3.4 ratio (ref 0.0–4.4)
Cholesterol, Total: 168 mg/dL (ref 100–199)
HDL: 49 mg/dL (ref >39)
LDL Chol Calc (NIH): 61 mg/dL (ref 0–99)
Triglycerides: 379 mg/dL — ABNORMAL HIGH (ref 0–149)
VLDL Cholesterol Cal: 58 mg/dL — ABNORMAL HIGH (ref 5–40)

## 2022-03-14 LAB — CMP14+EGFR
ALT: 15 IU/L (ref 0–32)
AST: 10 IU/L (ref 0–40)
Albumin/Globulin Ratio: 1.6 (ref 1.2–2.2)
Albumin: 4.2 g/dL (ref 3.9–4.9)
Alkaline Phosphatase: 75 IU/L (ref 44–121)
BUN/Creatinine Ratio: 32 — ABNORMAL HIGH (ref 12–28)
BUN: 42 mg/dL — ABNORMAL HIGH (ref 8–27)
Bilirubin Total: 0.3 mg/dL (ref 0.0–1.2)
CO2: 23 mmol/L (ref 20–29)
Calcium: 9.8 mg/dL (ref 8.7–10.3)
Chloride: 91 mmol/L — ABNORMAL LOW (ref 96–106)
Creatinine, Ser: 1.3 mg/dL — ABNORMAL HIGH (ref 0.57–1.00)
Globulin, Total: 2.6 g/dL (ref 1.5–4.5)
Glucose: 188 mg/dL — ABNORMAL HIGH (ref 70–99)
Potassium: 4.5 mmol/L (ref 3.5–5.2)
Sodium: 135 mmol/L (ref 134–144)
Total Protein: 6.8 g/dL (ref 6.0–8.5)
eGFR: 44 mL/min/{1.73_m2} — ABNORMAL LOW (ref >59)

## 2022-03-14 LAB — MICROALBUMIN / CREATININE URINE RATIO
Creatinine, Urine: 160.7 mg/dL
Microalb/Creat Ratio: 10 mg/g creat (ref 0–29)
Microalbumin, Urine: 15.7 ug/mL

## 2022-03-14 LAB — TSH: TSH: 2.24 u[IU]/mL (ref 0.450–4.500)

## 2022-03-14 LAB — VALPROIC ACID LEVEL: Valproic Acid Lvl: 56 ug/mL (ref 50–100)

## 2022-03-14 LAB — HEMOGLOBIN A1C
Est. average glucose Bld gHb Est-mCnc: 200 mg/dL
Hgb A1c MFr Bld: 8.6 % — ABNORMAL HIGH (ref 4.8–5.6)

## 2022-04-02 ENCOUNTER — Ambulatory Visit (INDEPENDENT_AMBULATORY_CARE_PROVIDER_SITE_OTHER): Payer: Medicare Other | Admitting: Internal Medicine

## 2022-04-02 ENCOUNTER — Other Ambulatory Visit (HOSPITAL_COMMUNITY): Payer: Self-pay

## 2022-04-02 ENCOUNTER — Telehealth: Payer: Self-pay

## 2022-04-02 ENCOUNTER — Encounter: Payer: Self-pay | Admitting: Internal Medicine

## 2022-04-02 ENCOUNTER — Other Ambulatory Visit (HOSPITAL_COMMUNITY): Payer: Self-pay | Admitting: *Deleted

## 2022-04-02 VITALS — BP 99/65 | HR 75 | Ht 68.0 in | Wt 260.0 lb

## 2022-04-02 DIAGNOSIS — I5032 Chronic diastolic (congestive) heart failure: Secondary | ICD-10-CM | POA: Diagnosis not present

## 2022-04-02 DIAGNOSIS — E1159 Type 2 diabetes mellitus with other circulatory complications: Secondary | ICD-10-CM | POA: Diagnosis not present

## 2022-04-02 DIAGNOSIS — R0609 Other forms of dyspnea: Secondary | ICD-10-CM

## 2022-04-02 DIAGNOSIS — E785 Hyperlipidemia, unspecified: Secondary | ICD-10-CM | POA: Diagnosis not present

## 2022-04-02 DIAGNOSIS — R072 Precordial pain: Secondary | ICD-10-CM | POA: Diagnosis not present

## 2022-04-02 DIAGNOSIS — I77819 Aortic ectasia, unspecified site: Secondary | ICD-10-CM

## 2022-04-02 DIAGNOSIS — E1169 Type 2 diabetes mellitus with other specified complication: Secondary | ICD-10-CM

## 2022-04-02 DIAGNOSIS — I10 Essential (primary) hypertension: Secondary | ICD-10-CM

## 2022-04-02 DIAGNOSIS — E119 Type 2 diabetes mellitus without complications: Secondary | ICD-10-CM | POA: Diagnosis not present

## 2022-04-02 DIAGNOSIS — I152 Hypertension secondary to endocrine disorders: Secondary | ICD-10-CM

## 2022-04-02 LAB — BASIC METABOLIC PANEL
BUN/Creatinine Ratio: 24 (ref 12–28)
BUN: 35 mg/dL — ABNORMAL HIGH (ref 8–27)
CO2: 22 mmol/L (ref 20–29)
Calcium: 9.5 mg/dL (ref 8.7–10.3)
Chloride: 95 mmol/L — ABNORMAL LOW (ref 96–106)
Creatinine, Ser: 1.45 mg/dL — ABNORMAL HIGH (ref 0.57–1.00)
Glucose: 240 mg/dL — ABNORMAL HIGH (ref 70–99)
Potassium: 5.2 mmol/L (ref 3.5–5.2)
Sodium: 136 mmol/L (ref 134–144)
eGFR: 39 mL/min/{1.73_m2} — ABNORMAL LOW (ref >59)

## 2022-04-02 MED ORDER — DAPAGLIFLOZIN PROPANEDIOL 10 MG PO TABS: 10.0000 mg | ORAL_TABLET | Freq: Every day | ORAL | 3 refills | Status: DC

## 2022-04-02 MED ORDER — IVABRADINE HCL 7.5 MG PO TABS
ORAL_TABLET | ORAL | 0 refills | Status: DC
  Filled 2022-04-02: qty 2, 1d supply, fill #0

## 2022-04-02 NOTE — Telephone Encounter (Addendum)
**Note De-Identified [place] Obfuscation** Per request from the pt while at her f/u with Dr Gasper Sells today, I called [place] [facility] to get an update on the pts approval for Farxiga asst. Per Phineas Real at [facility] the pts [place] assistance approval is valid until 08/31/2022 and that her next refill was auto filled on 7/26 but has not shipped out yet as it is a little too soon.  Phineas Real states that this is the pts last refill and that they will need a new prescription by 9/22 to continue providing the pt her [place].  Per Kiara's recommendation, I have e-scribed the pts Farxiga 10 mg refill to [facility] for # 90 with 3 with a note to the pharmacy ID #: [id number].  The pt is aware and thanked me for following up with her.
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
## 2022-04-02 NOTE — Patient Instructions (Addendum)
Medication Instructions:  Your physician recommends that you continue on your current medications as directed. Please refer to the Current Medication list given to you today. I will fax over a refill for Farxiga  *If you need a refill on your cardiac medications before your next appointment, please call your pharmacy*   Lab Work: TODAY: BMP If you have labs (blood work) drawn today and your tests are completely normal, you will receive your results only by: [place] (if you have MyChart) OR A paper copy in the mail If you have any lab test that is abnormal or we need to change your treatment, we will call you to review the results.   Testing/Procedures: Your physician has requested that you have cardiac CT. Cardiac computed tomography (CT) is a painless test that uses an x-ray machine to take clear, detailed pictures of your heart. For further information please visit [email or web address].[email or web address]. Please follow instruction sheet as given.     Follow-Up: At [facility], you and your health needs are our priority.  As part of our continuing mission to provide you with exceptional heart care, we have created designated Provider Care Teams.  These Care Teams include your primary Cardiologist (physician) and Advanced Practice Providers (APPs -  Physician Assistants and Nurse Practitioners) who all work together to provide you with the care you need, when you need it.  We recommend signing up for the patient portal called "MyChart".  Sign up information is provided on this After Visit Summary.  MyChart is used to connect with patients for Virtual Visits (Telemedicine).  Patients are able to view lab/test results, encounter notes, upcoming appointments, etc.  Non-urgent messages can be sent to your provider as well.   To learn more about what you can do with MyChart, go to [email or web address].    Your next appointment:   5-6 month(s)  The format for your next appointment:   In  Person  Provider:   Werner Lean, MD     Other Instructions   Your cardiac CT will be scheduled at one of the below locations:   [facility] [address] [place], [place] [address] [phone]   At [facility], please arrive at the [facility] and Children's Entrance (Entrance C2) of [facility] 30 minutes prior to test start time. You can use the FREE valet parking offered at entrance C (encouraged to control the heart rate for the test)  Proceed to the [facility] Radiology Department (first floor) to check-in and test prep.  All radiology patients and guests should use entrance C2 at [facility], accessed from [facility], even though the hospital's physical address listed is [address]    Please follow these instructions carefully (unless otherwise directed):  On the Night Before the Test: Be sure to Drink plenty of water. Do not consume any caffeinated/decaffeinated beverages or chocolate 12 hours prior to your test. Do not take any antihistamines 12 hours prior to your test.  On the Day of the Test: Drink plenty of water until 1 hour prior to the test. Do not eat any food 4 hours prior to the test. You may take your regular medications prior to the test.  Take metoprolol (Lopressor) two hours prior to test. HOLD Torsemide morning of the test. FEMALES- please wear underwire-free bra if available, avoid dresses & tight clothing        After the Test: Drink plenty of water. After receiving IV contrast, you  may experience a mild flushed feeling. This is normal. On occasion, you may experience a mild rash up to 24 hours after the test. This is not dangerous. If this occurs, you can take Benadryl 25 mg and increase your fluid intake. If you experience trouble breathing, this can be serious. If it is severe call 911 IMMEDIATELY. If it is mild, please call our office. If you take any of these  medications: Glipizide/Metformin, Avandament, Glucavance, please do not take 48 hours after completing test unless otherwise instructed.  We will call to schedule your test 2-4 weeks out understanding that some insurance companies will need an authorization prior to the service being performed.   For non-scheduling related questions, please contact the cardiac imaging nurse navigator should you have any questions/concerns: Marchia Bond, Cardiac Imaging Nurse Navigator Gordy Clement, Cardiac Imaging Nurse Navigator [place] [facility] Direct Office Dial: [phone]   For scheduling needs, including cancellations and rescheduling, please call [place], [phone].   Important Information About Sugar

## 2022-04-07 ENCOUNTER — Other Ambulatory Visit: Payer: Self-pay | Admitting: *Deleted

## 2022-04-07 DIAGNOSIS — N289 Disorder of kidney and ureter, unspecified: Secondary | ICD-10-CM

## 2022-04-21 ENCOUNTER — Telehealth (HOSPITAL_COMMUNITY): Payer: Self-pay | Admitting: Emergency Medicine

## 2022-04-21 NOTE — Telephone Encounter (Signed)
Instructed the patient about IV hydration to aid in supporting the kidenys for contrast.   Pt instructed to arrive 1000 for infusion clinic and that she will need to allow 3 hr for appt.   Marchia Bond RN Navigator Cardiac Imaging [facility] [phone] Office  [phone] Cell

## 2022-04-21 NOTE — Telephone Encounter (Signed)
Reaching out to patient to offer assistance regarding upcoming cardiac imaging study; pt verbalizes understanding of appt date/time, parking situation and where to check in, pre-test NPO status and medications ordered, and verified current allergies; name and call back number provided for further questions should they arise Julie Bond RN Navigator Cardiac Imaging [place] [facility] [phone] office [phone] cell   Arrival 1130 w/c entrance LEFT shoulder mobility limitations Denies iv issues '15mg'$  ivabradine Holding diuretics, diabetes meds

## 2022-04-21 NOTE — Telephone Encounter (Signed)
Attempted to call patient regarding upcoming cardiac CT appointment. °Left message on voicemail with name and callback number °Jesika Men RN Navigator Cardiac Imaging °[place] [facility] °[phone] Office °[phone] Cell ° °

## 2022-04-22 ENCOUNTER — Other Ambulatory Visit (HOSPITAL_COMMUNITY): Payer: Self-pay | Admitting: Internal Medicine

## 2022-04-22 ENCOUNTER — Telehealth (HOSPITAL_COMMUNITY): Payer: Self-pay | Admitting: Emergency Medicine

## 2022-04-22 ENCOUNTER — Ambulatory Visit (HOSPITAL_COMMUNITY)
Admission: RE | Admit: 2022-04-22 | Discharge: 2022-04-22 | Disposition: A | Discharge status: Home / Self Care | Payer: Medicare Other | Source: Ambulatory Visit | Attending: Internal Medicine | Admitting: Internal Medicine

## 2022-04-22 ENCOUNTER — Encounter (HOSPITAL_COMMUNITY)
Admission: RE | Admit: 2022-04-22 | Discharge: 2022-04-22 | Disposition: A | Discharge status: Home / Self Care | Payer: Medicare Other | Source: Ambulatory Visit | Attending: Internal Medicine | Admitting: Internal Medicine

## 2022-04-22 VITALS — BP 124/59 | HR 65

## 2022-04-22 DIAGNOSIS — I2510 Atherosclerotic heart disease of native coronary artery without angina pectoris: Secondary | ICD-10-CM | POA: Diagnosis not present

## 2022-04-22 DIAGNOSIS — N289 Disorder of kidney and ureter, unspecified: Secondary | ICD-10-CM | POA: Insufficient documentation

## 2022-04-22 DIAGNOSIS — R072 Precordial pain: Secondary | ICD-10-CM

## 2022-04-22 DIAGNOSIS — I77819 Aortic ectasia, unspecified site: Secondary | ICD-10-CM | POA: Insufficient documentation

## 2022-04-22 DIAGNOSIS — I700 Atherosclerosis of aorta: Secondary | ICD-10-CM | POA: Insufficient documentation

## 2022-04-22 DIAGNOSIS — E1159 Type 2 diabetes mellitus with other circulatory complications: Secondary | ICD-10-CM | POA: Insufficient documentation

## 2022-04-22 DIAGNOSIS — I719 Aortic aneurysm of unspecified site, without rupture: Secondary | ICD-10-CM | POA: Diagnosis not present

## 2022-04-22 DIAGNOSIS — I7121 Aneurysm of the ascending aorta, without rupture: Secondary | ICD-10-CM | POA: Insufficient documentation

## 2022-04-22 DIAGNOSIS — M47814 Spondylosis without myelopathy or radiculopathy, thoracic region: Secondary | ICD-10-CM | POA: Diagnosis not present

## 2022-04-22 DIAGNOSIS — R079 Chest pain, unspecified: Secondary | ICD-10-CM

## 2022-04-22 DIAGNOSIS — R0609 Other forms of dyspnea: Secondary | ICD-10-CM | POA: Insufficient documentation

## 2022-04-22 DIAGNOSIS — I152 Hypertension secondary to endocrine disorders: Secondary | ICD-10-CM | POA: Insufficient documentation

## 2022-04-22 LAB — BASIC METABOLIC PANEL
Anion gap: 12 (ref 5–15)
BUN: 28 mg/dL — ABNORMAL HIGH (ref 8–23)
CO2: 21 mmol/L — ABNORMAL LOW (ref 22–32)
Calcium: 8.9 mg/dL (ref 8.9–10.3)
Chloride: 101 mmol/L (ref 98–111)
Creatinine, Ser: 1.24 mg/dL — ABNORMAL HIGH (ref 0.44–1.00)
GFR, Estimated: 47 mL/min — ABNORMAL LOW (ref >60)
Glucose, Bld: 216 mg/dL — ABNORMAL HIGH (ref 70–99)
Potassium: 4.5 mmol/L (ref 3.5–5.1)
Sodium: 134 mmol/L — ABNORMAL LOW (ref 135–145)

## 2022-04-22 MED ORDER — IOHEXOL 350 MG/ML SOLN
100.0000 mL | Freq: Once | INTRAVENOUS | Status: AC | PRN
  Administered 2022-04-22: 100 mL via INTRAVENOUS

## 2022-04-22 MED ORDER — NITROGLYCERIN 0.4 MG SL SUBL
SUBLINGUAL_TABLET | SUBLINGUAL | Status: AC
  Filled 2022-04-22: qty 2

## 2022-04-22 MED ORDER — SODIUM CHLORIDE 0.9 % WEIGHT BASED INFUSION
3.0000 mL/kg/h | INTRAVENOUS | Status: AC
  Administered 2022-04-22: 3 mL/kg/h via INTRAVENOUS

## 2022-04-22 MED ORDER — SODIUM CHLORIDE 0.9 % WEIGHT BASED INFUSION: 1.0000 mL/kg/h | INTRAVENOUS | Status: DC

## 2022-04-22 MED ORDER — IVABRADINE HCL 7.5 MG PO TABS: ORAL_TABLET | ORAL | 0 refills | Status: DC

## 2022-04-22 MED ORDER — NITROGLYCERIN 0.4 MG SL SUBL
0.8000 mg | SUBLINGUAL_TABLET | Freq: Once | SUBLINGUAL | Status: AC
  Administered 2022-04-22: 0.8 mg via SUBLINGUAL

## 2022-04-22 NOTE — Telephone Encounter (Signed)
$'15mg'G$  ivabradine resent to [employer] [facility] [address] [place] for repeat cor CTA.   Marchia Bond RN Navigator Cardiac Imaging [facility] [phone] Office  [phone] Cell

## 2022-04-24 ENCOUNTER — Telehealth: Payer: Self-pay | Admitting: Internal Medicine

## 2022-04-24 ENCOUNTER — Other Ambulatory Visit (HOSPITAL_COMMUNITY): Payer: Self-pay | Admitting: *Deleted

## 2022-04-24 ENCOUNTER — Telehealth (HOSPITAL_COMMUNITY): Payer: Self-pay | Admitting: *Deleted

## 2022-04-24 DIAGNOSIS — R072 Precordial pain: Secondary | ICD-10-CM

## 2022-04-24 MED ORDER — IVABRADINE HCL 7.5 MG PO TABS: ORAL_TABLET | ORAL | 0 refills | Status: DC

## 2022-04-24 NOTE — Telephone Encounter (Signed)
Reaching out to patient to offer assistance regarding upcoming cardiac imaging study; pt verbalizes understanding of appt date/time, parking situation and where to check in, pre-test NPO status and medications ordered, and verified current allergies; name and call back number provided for further questions should they arise  Gordy Clement RN Navigator Cardiac Imaging [facility] [phone] office [phone] cell  Patient to take '15mg'$  ivabradine two hours prior to her cardiac CT scan. She is aware to arrive at the infusion clinic at [place].

## 2022-04-24 NOTE — Telephone Encounter (Signed)
Called Patient Discussed improved creatinine with hydration Discussed Mild Aortic ectasia 40 mm Discussed the Bolus timing. Discussed that our team reached out to the radiology billing manager who is aware not to charge twice for the cardiac CT   Rudean Haskell, MD [place]  [place] [place], [place] [address] [phone]  3:51 PM

## 2022-04-25 ENCOUNTER — Ambulatory Visit (HOSPITAL_COMMUNITY)
Admission: RE | Admit: 2022-04-25 | Discharge: 2022-04-25 | Disposition: A | Discharge status: Home / Self Care | Payer: Medicare Other | Source: Ambulatory Visit | Attending: Internal Medicine | Admitting: Internal Medicine

## 2022-04-25 DIAGNOSIS — R079 Chest pain, unspecified: Secondary | ICD-10-CM | POA: Insufficient documentation

## 2022-04-25 DIAGNOSIS — I77810 Thoracic aortic ectasia: Secondary | ICD-10-CM | POA: Insufficient documentation

## 2022-04-25 DIAGNOSIS — I2584 Coronary atherosclerosis due to calcified coronary lesion: Secondary | ICD-10-CM | POA: Insufficient documentation

## 2022-04-25 DIAGNOSIS — N289 Disorder of kidney and ureter, unspecified: Secondary | ICD-10-CM | POA: Insufficient documentation

## 2022-04-25 DIAGNOSIS — I6523 Occlusion and stenosis of bilateral carotid arteries: Secondary | ICD-10-CM | POA: Diagnosis not present

## 2022-04-25 DIAGNOSIS — I2510 Atherosclerotic heart disease of native coronary artery without angina pectoris: Secondary | ICD-10-CM | POA: Diagnosis not present

## 2022-04-25 DIAGNOSIS — I517 Cardiomegaly: Secondary | ICD-10-CM | POA: Diagnosis not present

## 2022-04-25 LAB — BASIC METABOLIC PANEL
Anion gap: 8 (ref 5–15)
BUN: 22 mg/dL (ref 8–23)
CO2: 24 mmol/L (ref 22–32)
Calcium: 8.9 mg/dL (ref 8.9–10.3)
Chloride: 101 mmol/L (ref 98–111)
Creatinine, Ser: 1.2 mg/dL — ABNORMAL HIGH (ref 0.44–1.00)
GFR, Estimated: 49 mL/min — ABNORMAL LOW (ref >60)
Glucose, Bld: 205 mg/dL — ABNORMAL HIGH (ref 70–99)
Potassium: 4.6 mmol/L (ref 3.5–5.1)
Sodium: 133 mmol/L — ABNORMAL LOW (ref 135–145)

## 2022-04-25 MED ORDER — SODIUM CHLORIDE 0.9 % WEIGHT BASED INFUSION: 1.0000 mL/kg/h | INTRAVENOUS | Status: DC

## 2022-04-25 MED ORDER — IOHEXOL 350 MG/ML SOLN
100.0000 mL | Freq: Once | INTRAVENOUS | Status: AC | PRN
  Administered 2022-04-25: 100 mL via INTRAVENOUS

## 2022-04-25 MED ORDER — NITROGLYCERIN 0.4 MG SL SUBL: 0.8000 mg | SUBLINGUAL_TABLET | Freq: Once | SUBLINGUAL | Status: AC

## 2022-04-25 MED ORDER — NITROGLYCERIN 0.4 MG SL SUBL
SUBLINGUAL_TABLET | SUBLINGUAL | Status: AC
  Administered 2022-04-25: 0.8 mg via SUBLINGUAL
  Filled 2022-04-25: qty 2

## 2022-04-25 MED ORDER — SODIUM CHLORIDE 0.9 % WEIGHT BASED INFUSION
3.0000 mL/kg/h | INTRAVENOUS | Status: AC
  Administered 2022-04-25: 3 mL/kg/h via INTRAVENOUS

## 2022-05-07 ENCOUNTER — Telehealth: Payer: Self-pay | Admitting: Nurse Practitioner

## 2022-05-07 ENCOUNTER — Other Ambulatory Visit: Payer: Self-pay

## 2022-05-07 ENCOUNTER — Ambulatory Visit: Payer: Medicare Other | Admitting: Internal Medicine

## 2022-05-07 DIAGNOSIS — M4810 Ankylosing hyperostosis [Forestier], site unspecified: Secondary | ICD-10-CM

## 2022-05-07 DIAGNOSIS — K219 Gastro-esophageal reflux disease without esophagitis: Secondary | ICD-10-CM

## 2022-05-07 DIAGNOSIS — E1165 Type 2 diabetes mellitus with hyperglycemia: Secondary | ICD-10-CM

## 2022-05-07 DIAGNOSIS — I10 Essential (primary) hypertension: Secondary | ICD-10-CM

## 2022-05-07 DIAGNOSIS — E785 Hyperlipidemia, unspecified: Secondary | ICD-10-CM

## 2022-05-07 DIAGNOSIS — G8929 Other chronic pain: Secondary | ICD-10-CM

## 2022-05-07 DIAGNOSIS — E039 Hypothyroidism, unspecified: Secondary | ICD-10-CM

## 2022-05-07 MED ORDER — AMLODIPINE BESYLATE 5 MG PO TABS: 5.0000 mg | ORAL_TABLET | Freq: Every day | ORAL | 3 refills | Status: DC

## 2022-05-07 MED ORDER — METFORMIN HCL 500 MG PO TABS: 500.0000 mg | ORAL_TABLET | Freq: Three times a day (TID) | ORAL | 3 refills | Status: DC

## 2022-05-07 MED ORDER — ROSUVASTATIN CALCIUM 10 MG PO TABS: 10.0000 mg | ORAL_TABLET | Freq: Every day | ORAL | 3 refills | Status: DC

## 2022-05-07 MED ORDER — OMEPRAZOLE 40 MG PO CPDR: 40.0000 mg | DELAYED_RELEASE_CAPSULE | Freq: Every day | ORAL | 3 refills | Status: DC

## 2022-05-07 MED ORDER — METOPROLOL SUCCINATE ER 25 MG PO TB24: 25.0000 mg | ORAL_TABLET | Freq: Every day | ORAL | 3 refills | Status: DC

## 2022-05-07 MED ORDER — INDOMETHACIN 50 MG PO CAPS: 50.0000 mg | ORAL_CAPSULE | Freq: Two times a day (BID) | ORAL | 3 refills | Status: DC

## 2022-05-07 MED ORDER — LEVOTHYROXINE SODIUM 175 MCG PO TABS: 175.0000 ug | ORAL_TABLET | Freq: Every day | ORAL | 3 refills | Status: DC

## 2022-05-07 NOTE — Telephone Encounter (Signed)
Refills sent

## 2022-05-07 NOTE — Telephone Encounter (Signed)
Pt called stating that all of her meds needs refill but are expired. Wants to know if you can please refill?     [place] Phar      metoprolol succinate (TOPROL-XL) 25 MG 24 hr tablet  metFORMIN (GLUCOPHAGE) 500 MG tablet  levothyroxine (SYNTHROID) 175 MCG tablet  rosuvastatin (CRESTOR) 10 MG tablet  amLODipine (NORVASC) 5 MG tablet   omeprazole (PRILOSEC) 40 MG capsule   indomethacin (INDOCIN) 50 MG capsule

## 2022-05-27 DIAGNOSIS — B029 Zoster without complications: Secondary | ICD-10-CM | POA: Insufficient documentation

## 2022-05-28 ENCOUNTER — Ambulatory Visit
Admission: RE | Admit: 2022-05-28 | Discharge: 2022-05-28 | Disposition: A | Discharge status: Home / Self Care | Payer: Medicare Other | Source: Ambulatory Visit | Attending: Family Medicine | Admitting: Family Medicine

## 2022-05-28 VITALS — BP 112/69 | HR 91 | Temp 98.6°F | Resp 18

## 2022-05-28 DIAGNOSIS — B029 Zoster without complications: Secondary | ICD-10-CM

## 2022-05-28 MED ORDER — VALACYCLOVIR HCL 1 G PO TABS
1000.0000 mg | ORAL_TABLET | Freq: Three times a day (TID) | ORAL | 0 refills | Status: AC
Start: 2022-05-28 — End: 2022-06-07

## 2022-05-28 MED ORDER — PREDNISONE 20 MG PO TABS: 20.0000 mg | ORAL_TABLET | Freq: Every day | ORAL | 0 refills | Status: DC

## 2022-05-28 NOTE — ED Triage Notes (Signed)
Pt think she has single sin the right sided of the face, due to painful, burning itching x 1 day.
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
## 2022-05-28 NOTE — Discharge Instructions (Signed)
In the area of your shingles, I highly recommend that you call your ophthalmologist right away and have a follow-up with them in the next few days to ensure no ocular involvement.  Currently, I did not see any viral lesions within your eye which is reassuring but this can change if the rash continues to spread.  I have sent over an antiviral medication called Valtrex and a low-dose of prednisone in hopes of keeping her blood sugars at a reasonable level while still helping to reduce your shingles load.  If you begin to have severe eye pain or visual change and you are unable to see your ophthalmologist right away, please go to the emergency department

## 2022-05-29 DIAGNOSIS — B0230 Zoster ocular disease, unspecified: Secondary | ICD-10-CM | POA: Diagnosis not present

## 2022-05-31 ENCOUNTER — Encounter: Payer: Self-pay | Admitting: Internal Medicine

## 2022-06-10 ENCOUNTER — Telehealth: Payer: Self-pay | Admitting: *Deleted

## 2022-06-10 NOTE — Patient Outreach (Signed)
  Care Coordination   06/10/2022 Name: Julie Bean MRN: [id number] DOB: [birth year]   Care Coordination Outreach Attempts:  An unsuccessful telephone outreach was attempted today to offer the patient information about available care coordination services as a benefit of their health plan.   Follow Up Plan:  Additional outreach attempts will be made to offer the patient care coordination information and services.   Encounter Outcome:  No Answer  Care Coordination Interventions Activated:  No   Care Coordination Interventions:  No, not indicated    Jacqlyn Larsen [facility] [place] RN Care Coordinator [phone]

## 2022-06-12 ENCOUNTER — Telehealth: Payer: Self-pay | Admitting: *Deleted

## 2022-06-12 NOTE — Patient Outreach (Signed)
  Care Coordination   06/12/2022 Name: Julie Bean MRN: [id number] DOB: 1951-07-28   Care Coordination Outreach Attempts:  A second unsuccessful outreach was attempted today to offer the patient with information about available care coordination services as a benefit of their health plan.     Follow Up Plan:  Additional outreach attempts will be made to offer the patient care coordination information and services.   Encounter Outcome:  No Answer  Care Coordination Interventions Activated:  No   Care Coordination Interventions:  No, not indicated    SIG Ellary Casamento C. Myrtie Neither, MSN, [facility] Gerontological Nurse Practitioner [facility] Care Management [phone]

## 2022-06-16 ENCOUNTER — Other Ambulatory Visit: Payer: Self-pay | Admitting: Nurse Practitioner

## 2022-06-16 ENCOUNTER — Telehealth: Payer: Self-pay | Admitting: *Deleted

## 2022-06-16 NOTE — Patient Outreach (Signed)
  Care Coordination   06/16/2022 Name: Julie Bean MRN: [id number] DOB: [birth year]   Care Coordination Outreach Attempts:  A third unsuccessful outreach was attempted today to offer the patient with information about available care coordination services as a benefit of their health plan.   Follow Up Plan:  No further outreach attempts will be made at this time. We have been unable to contact the patient to offer or enroll patient in care coordination services  Encounter Outcome:  No Answer  Care Coordination Interventions Activated:  No   Care Coordination Interventions:  No, not indicated    Jacqlyn Larsen [facility], BSN [facility] RN Care Coordinator [phone]

## 2022-07-08 ENCOUNTER — Encounter: Payer: Self-pay | Admitting: Internal Medicine

## 2022-07-08 ENCOUNTER — Ambulatory Visit: Payer: Medicare Other | Admitting: Nurse Practitioner

## 2022-07-08 ENCOUNTER — Ambulatory Visit (INDEPENDENT_AMBULATORY_CARE_PROVIDER_SITE_OTHER): Payer: Medicare Other | Admitting: Internal Medicine

## 2022-07-08 VITALS — BP 120/74 | HR 83 | Ht 68.0 in | Wt 257.0 lb

## 2022-07-08 DIAGNOSIS — N1831 Chronic kidney disease, stage 3a: Secondary | ICD-10-CM

## 2022-07-08 DIAGNOSIS — Z1159 Encounter for screening for other viral diseases: Secondary | ICD-10-CM

## 2022-07-08 DIAGNOSIS — Z0001 Encounter for general adult medical examination with abnormal findings: Secondary | ICD-10-CM | POA: Diagnosis not present

## 2022-07-08 DIAGNOSIS — E039 Hypothyroidism, unspecified: Secondary | ICD-10-CM | POA: Diagnosis not present

## 2022-07-08 DIAGNOSIS — Z6839 Body mass index (BMI) 39.0-39.9, adult: Secondary | ICD-10-CM

## 2022-07-08 DIAGNOSIS — E1169 Type 2 diabetes mellitus with other specified complication: Secondary | ICD-10-CM | POA: Diagnosis not present

## 2022-07-08 DIAGNOSIS — Z1321 Encounter for screening for nutritional disorder: Secondary | ICD-10-CM

## 2022-07-08 DIAGNOSIS — E785 Hyperlipidemia, unspecified: Secondary | ICD-10-CM

## 2022-07-08 DIAGNOSIS — E1159 Type 2 diabetes mellitus with other circulatory complications: Secondary | ICD-10-CM

## 2022-07-08 DIAGNOSIS — I152 Hypertension secondary to endocrine disorders: Secondary | ICD-10-CM | POA: Diagnosis not present

## 2022-07-08 DIAGNOSIS — E1165 Type 2 diabetes mellitus with hyperglycemia: Secondary | ICD-10-CM

## 2022-07-08 DIAGNOSIS — I5032 Chronic diastolic (congestive) heart failure: Secondary | ICD-10-CM

## 2022-07-08 LAB — POCT GLYCOSYLATED HEMOGLOBIN (HGB A1C): HbA1c, POC (controlled diabetic range): 8.6 % — AB (ref 0.0–7.0)
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
## 2022-07-08 NOTE — Patient Instructions (Signed)
It was a pleasure to see you today.  Thank you for giving [place] the opportunity to be involved in your care.  Below is a brief recap of your visit and next steps.  We will plan to see you again in 1 month.  Summary No medication changes today. We will follow up in 1 month for your annual exam. I will order labs to be completed prior to your appointment. I recommend making changes to your diet in order to lower your A1c

## 2022-07-09 ENCOUNTER — Telehealth: Payer: Self-pay | Admitting: Internal Medicine

## 2022-07-09 DIAGNOSIS — I5032 Chronic diastolic (congestive) heart failure: Secondary | ICD-10-CM

## 2022-07-09 NOTE — Telephone Encounter (Signed)
Patient is calling wanting to know if she still needs her echo scheduled due to her recent CT she had in August

## 2022-07-10 NOTE — Telephone Encounter (Signed)
Called pt advised of MD response: We can postpone the study; we can push it back to August 2024        Rescheduled pt for Apr 08, 2023 at 9:30 am at [facility].  No further questions or concerns.

## 2022-07-13 DIAGNOSIS — N183 Chronic kidney disease, stage 3 unspecified: Secondary | ICD-10-CM | POA: Insufficient documentation

## 2022-07-13 NOTE — Assessment & Plan Note (Signed)
CKD 3A based on most recent labs. She has previously been referred to nephrology and states that she will be establishing care this month.

## 2022-07-13 NOTE — Assessment & Plan Note (Signed)
Most recent A1c 8.6 in August and again on POC A1c today. Glipizide 10 mg daily was added in July. She is also prescribed Farxiga 10 mg daily and metformin 500 mg TID. She attributes poor dieting habits in recent months as the cause of her increased A1c, which had previously been well-controlled. -No medication changes today. Julie Bean believes she can improve her A1c by changing her diet given her recent history of poor eating and previously well-controlled A1c.

## 2022-07-16 DIAGNOSIS — H25812 Combined forms of age-related cataract, left eye: Secondary | ICD-10-CM | POA: Diagnosis not present

## 2022-07-16 DIAGNOSIS — H353132 Nonexudative age-related macular degeneration, bilateral, intermediate dry stage: Secondary | ICD-10-CM | POA: Diagnosis not present

## 2022-07-16 DIAGNOSIS — H25811 Combined forms of age-related cataract, right eye: Secondary | ICD-10-CM | POA: Diagnosis not present

## 2022-07-17 ENCOUNTER — Other Ambulatory Visit (HOSPITAL_COMMUNITY): Payer: Medicare Other

## 2022-07-22 DIAGNOSIS — E1122 Type 2 diabetes mellitus with diabetic chronic kidney disease: Secondary | ICD-10-CM | POA: Diagnosis not present

## 2022-07-22 DIAGNOSIS — N1831 Chronic kidney disease, stage 3a: Secondary | ICD-10-CM | POA: Diagnosis not present

## 2022-07-22 DIAGNOSIS — E871 Hypo-osmolality and hyponatremia: Secondary | ICD-10-CM | POA: Diagnosis not present

## 2022-07-22 DIAGNOSIS — R768 Other specified abnormal immunological findings in serum: Secondary | ICD-10-CM | POA: Diagnosis not present

## 2022-07-22 DIAGNOSIS — E875 Hyperkalemia: Secondary | ICD-10-CM | POA: Diagnosis not present

## 2022-07-22 DIAGNOSIS — I5030 Unspecified diastolic (congestive) heart failure: Secondary | ICD-10-CM | POA: Diagnosis not present

## 2022-07-22 DIAGNOSIS — E782 Mixed hyperlipidemia: Secondary | ICD-10-CM | POA: Diagnosis not present

## 2022-07-22 DIAGNOSIS — N1832 Chronic kidney disease, stage 3b: Secondary | ICD-10-CM | POA: Diagnosis not present

## 2022-07-22 DIAGNOSIS — I129 Hypertensive chronic kidney disease with stage 1 through stage 4 chronic kidney disease, or unspecified chronic kidney disease: Secondary | ICD-10-CM | POA: Diagnosis not present

## 2022-07-23 ENCOUNTER — Other Ambulatory Visit (HOSPITAL_COMMUNITY): Payer: Self-pay | Admitting: Internal Medicine

## 2022-07-23 DIAGNOSIS — N1831 Chronic kidney disease, stage 3a: Secondary | ICD-10-CM

## 2022-07-31 ENCOUNTER — Other Ambulatory Visit: Payer: Self-pay | Admitting: Internal Medicine

## 2022-07-31 DIAGNOSIS — E089 Diabetes mellitus due to underlying condition without complications: Secondary | ICD-10-CM

## 2022-07-31 DIAGNOSIS — R799 Abnormal finding of blood chemistry, unspecified: Secondary | ICD-10-CM | POA: Diagnosis not present

## 2022-07-31 DIAGNOSIS — Z6830 Body mass index (BMI) 30.0-30.9, adult: Secondary | ICD-10-CM | POA: Diagnosis not present

## 2022-07-31 DIAGNOSIS — Z0001 Encounter for general adult medical examination with abnormal findings: Secondary | ICD-10-CM

## 2022-08-01 LAB — HEMOGLOBIN A1C
Est. average glucose Bld gHb Est-mCnc: 206 mg/dL
Hgb A1c MFr Bld: 8.8 % — ABNORMAL HIGH (ref 4.8–5.6)

## 2022-08-01 LAB — CBC WITH DIFFERENTIAL/PLATELET
Basophils Absolute: 0.1 10*3/uL (ref 0.0–0.2)
Basos: 2 %
EOS (ABSOLUTE): 0.4 10*3/uL (ref 0.0–0.4)
Eos: 6 %
Hematocrit: 36 % (ref 34.0–46.6)
Hemoglobin: 11.7 g/dL (ref 11.1–15.9)
Immature Grans (Abs): 0.1 10*3/uL (ref 0.0–0.1)
Immature Granulocytes: 1 %
Lymphocytes Absolute: 2.2 10*3/uL (ref 0.7–3.1)
Lymphs: 32 %
MCH: 29 pg (ref 26.6–33.0)
MCHC: 32.5 g/dL (ref 31.5–35.7)
MCV: 89 fL (ref 79–97)
Monocytes Absolute: 0.6 10*3/uL (ref 0.1–0.9)
Monocytes: 9 %
Neutrophils Absolute: 3.6 10*3/uL (ref 1.4–7.0)
Neutrophils: 50 %
Platelets: 242 10*3/uL (ref 150–450)
RBC: 4.04 x10E6/uL (ref 3.77–5.28)
RDW: 15.5 % — ABNORMAL HIGH (ref 11.7–15.4)
WBC: 7.1 10*3/uL (ref 3.4–10.8)

## 2022-08-01 LAB — CMP14+EGFR
ALT: 13 IU/L (ref 0–32)
AST: 17 IU/L (ref 0–40)
Albumin/Globulin Ratio: 1.8 (ref 1.2–2.2)
Albumin: 4.1 g/dL (ref 3.9–4.9)
Alkaline Phosphatase: 83 IU/L (ref 44–121)
BUN/Creatinine Ratio: 29 — ABNORMAL HIGH (ref 12–28)
BUN: 31 mg/dL — ABNORMAL HIGH (ref 8–27)
Bilirubin Total: 0.3 mg/dL (ref 0.0–1.2)
CO2: 21 mmol/L (ref 20–29)
Calcium: 9.4 mg/dL (ref 8.7–10.3)
Chloride: 93 mmol/L — ABNORMAL LOW (ref 96–106)
Creatinine, Ser: 1.07 mg/dL — ABNORMAL HIGH (ref 0.57–1.00)
Globulin, Total: 2.3 g/dL (ref 1.5–4.5)
Glucose: 185 mg/dL — ABNORMAL HIGH (ref 70–99)
Potassium: 4.3 mmol/L (ref 3.5–5.2)
Sodium: 135 mmol/L (ref 134–144)
Total Protein: 6.4 g/dL (ref 6.0–8.5)
eGFR: 56 mL/min/{1.73_m2} — ABNORMAL LOW (ref >59)

## 2022-08-01 LAB — TSH+FREE T4
Free T4: 1.76 ng/dL (ref 0.82–1.77)
TSH: 5.02 u[IU]/mL — ABNORMAL HIGH (ref 0.450–4.500)

## 2022-08-01 LAB — VITAMIN D 25 HYDROXY (VIT D DEFICIENCY, FRACTURES): Vit D, 25-Hydroxy: 30.3 ng/mL (ref 30.0–100.0)

## 2022-08-01 LAB — LIPID PANEL
Chol/HDL Ratio: 4.4 ratio (ref 0.0–4.4)
Cholesterol, Total: 184 mg/dL (ref 100–199)
HDL: 42 mg/dL (ref >39)
LDL Chol Calc (NIH): 68 mg/dL (ref 0–99)
Triglycerides: 481 mg/dL — ABNORMAL HIGH (ref 0–149)
VLDL Cholesterol Cal: 74 mg/dL — ABNORMAL HIGH (ref 5–40)

## 2022-08-01 LAB — B12 AND FOLATE PANEL
Folate: >20 ng/mL (ref >3.0)
Vitamin B-12: 953 pg/mL (ref 232–1245)

## 2022-08-05 ENCOUNTER — Ambulatory Visit (INDEPENDENT_AMBULATORY_CARE_PROVIDER_SITE_OTHER): Payer: Medicare Other | Admitting: Internal Medicine

## 2022-08-05 ENCOUNTER — Encounter: Payer: Self-pay | Admitting: Internal Medicine

## 2022-08-05 VITALS — BP 131/78 | HR 85 | Ht 68.0 in | Wt 257.2 lb

## 2022-08-05 DIAGNOSIS — Z0001 Encounter for general adult medical examination with abnormal findings: Secondary | ICD-10-CM | POA: Diagnosis not present

## 2022-08-05 DIAGNOSIS — E1165 Type 2 diabetes mellitus with hyperglycemia: Secondary | ICD-10-CM

## 2022-08-05 DIAGNOSIS — Z23 Encounter for immunization: Secondary | ICD-10-CM | POA: Diagnosis not present

## 2022-08-05 DIAGNOSIS — E039 Hypothyroidism, unspecified: Secondary | ICD-10-CM | POA: Diagnosis not present

## 2022-08-05 DIAGNOSIS — N183 Chronic kidney disease, stage 3 unspecified: Secondary | ICD-10-CM | POA: Diagnosis not present

## 2022-08-05 MED ORDER — LEVOTHYROXINE SODIUM 150 MCG PO TABS: 150.0000 ug | ORAL_TABLET | Freq: Every day | ORAL | 2 refills | Status: DC
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
## 2022-08-05 NOTE — Patient Instructions (Signed)
It was a pleasure to see you today.  Thank you for giving [place] the opportunity to be involved in your care.  Below is a brief recap of your visit and next steps.  We will plan to see you again in 1 month.  Summary We completed your annual exam today. Decrease synthroid to 150 mcg daily Follow up in 1 month with repeat labs ordered prior to your appointment You will receive influenza and pneumonia vaccines today

## 2022-08-06 ENCOUNTER — Other Ambulatory Visit: Payer: Self-pay | Admitting: Internal Medicine

## 2022-08-06 ENCOUNTER — Encounter: Payer: Self-pay | Admitting: Internal Medicine

## 2022-08-06 ENCOUNTER — Ambulatory Visit: Payer: Medicare Other | Attending: Internal Medicine | Admitting: Internal Medicine

## 2022-08-06 VITALS — BP 122/68 | HR 86 | Ht 68.0 in | Wt 258.2 lb

## 2022-08-06 DIAGNOSIS — I10 Essential (primary) hypertension: Secondary | ICD-10-CM | POA: Diagnosis not present

## 2022-08-06 DIAGNOSIS — I5033 Acute on chronic diastolic (congestive) heart failure: Secondary | ICD-10-CM

## 2022-08-06 DIAGNOSIS — R0609 Other forms of dyspnea: Secondary | ICD-10-CM | POA: Diagnosis not present

## 2022-08-06 DIAGNOSIS — E119 Type 2 diabetes mellitus without complications: Secondary | ICD-10-CM

## 2022-08-06 NOTE — Patient Instructions (Signed)
Medication Instructions:  Your physician recommends that you continue on your current medications as directed. Please refer to the Current Medication list given to you today.  *If you need a refill on your cardiac medications before your next appointment, please call your pharmacy*   Lab Work: NONE If you have labs (blood work) drawn today and your tests are completely normal, you will receive your results only by: [place] (if you have MyChart) OR A paper copy in the mail If you have any lab test that is abnormal or we need to change your treatment, we will call you to review the results.   Testing/Procedures: Your physician has referred you to Cardiac Rehab at [facility]: At [facility], you and your health needs are our priority.  As part of our continuing mission to provide you with exceptional heart care, we have created designated Provider Care Teams.  These Care Teams include your primary Cardiologist (physician) and Advanced Practice Providers (APPs -  Physician Assistants and Nurse Practitioners) who all work together to provide you with the care you need, when you need it.   Your next appointment:   6 month(s)  The format for your next appointment:   In Person  Provider:   Werner Lean, MD      Important Information About Sugar

## 2022-08-07 ENCOUNTER — Ambulatory Visit (HOSPITAL_COMMUNITY)
Admission: RE | Admit: 2022-08-07 | Discharge: 2022-08-07 | Disposition: A | Discharge status: Home / Self Care | Payer: Medicare Other | Source: Ambulatory Visit | Attending: Internal Medicine | Admitting: Internal Medicine

## 2022-08-07 DIAGNOSIS — N281 Cyst of kidney, acquired: Secondary | ICD-10-CM | POA: Diagnosis not present

## 2022-08-07 DIAGNOSIS — N189 Chronic kidney disease, unspecified: Secondary | ICD-10-CM | POA: Diagnosis not present

## 2022-08-07 DIAGNOSIS — N1831 Chronic kidney disease, stage 3a: Secondary | ICD-10-CM | POA: Diagnosis not present

## 2022-08-08 ENCOUNTER — Other Ambulatory Visit (HOSPITAL_COMMUNITY): Payer: Self-pay

## 2022-08-08 DIAGNOSIS — R0609 Other forms of dyspnea: Secondary | ICD-10-CM

## 2022-08-10 ENCOUNTER — Encounter: Payer: Self-pay | Admitting: Internal Medicine

## 2022-08-10 DIAGNOSIS — Z0001 Encounter for general adult medical examination with abnormal findings: Secondary | ICD-10-CM

## 2022-08-10 HISTORY — DX: Encounter for general adult medical examination with abnormal findings: Z00.01

## 2022-08-10 NOTE — Assessment & Plan Note (Signed)
She is currently prescribed levothyroxine 175 mcg daily for treatment of hypothyroidism.  She is interested in reducing her dose to 150 mcg due to recent symptoms of heat intolerance, diarrhea, dry skin, and alopecia.  She has attributed the symptoms to her current dosing. -Through shared decision making, levothyroxine has been decreased to 150 mcg daily -Follow-up in 1 month for reassessment and repeat TSH/T4

## 2022-08-10 NOTE — Assessment & Plan Note (Signed)
She has recently reestablished care with nephrology.  Potassium supplementation was stopped and indomethacin dosing was cut in half.  She is scheduled for renal ultrasound later this week. -Repeat BMP in 1 month

## 2022-08-10 NOTE — Assessment & Plan Note (Signed)
Presenting today for her annual exam. -Labs updated last week and reviewed today -PCV 20 and influenza vaccines administered today -Remaining HM items are up-to-date

## 2022-08-10 NOTE — Assessment & Plan Note (Signed)
A1c 8.8 on 11/30.  Her current regimen consists of glipizide 10 mg daily, Farxiga 10 mg daily, and metformin 500 mg 3 times daily. -No medication changes today -Consider starting Mounjaro at follow-up in 1 month -Diabetic eye exam recently completed

## 2022-08-12 DIAGNOSIS — E1142 Type 2 diabetes mellitus with diabetic polyneuropathy: Secondary | ICD-10-CM | POA: Diagnosis not present

## 2022-08-21 ENCOUNTER — Encounter (HOSPITAL_COMMUNITY): Payer: Medicare Other

## 2022-08-27 ENCOUNTER — Encounter: Payer: Self-pay | Admitting: Internal Medicine

## 2022-08-27 DIAGNOSIS — Z23 Encounter for immunization: Secondary | ICD-10-CM | POA: Diagnosis not present

## 2022-08-28 NOTE — Telephone Encounter (Signed)
Added to immunization record

## 2022-09-05 ENCOUNTER — Other Ambulatory Visit: Payer: Self-pay

## 2022-09-05 DIAGNOSIS — N1831 Chronic kidney disease, stage 3a: Secondary | ICD-10-CM

## 2022-09-05 DIAGNOSIS — E039 Hypothyroidism, unspecified: Secondary | ICD-10-CM

## 2022-09-06 ENCOUNTER — Encounter: Payer: Self-pay | Admitting: Internal Medicine

## 2022-09-06 LAB — TSH+FREE T4
Free T4: 1.13 ng/dL (ref 0.82–1.77)
TSH: 15.8 u[IU]/mL — ABNORMAL HIGH (ref 0.450–4.500)

## 2022-09-06 LAB — BASIC METABOLIC PANEL
BUN/Creatinine Ratio: 28 (ref 12–28)
BUN: 31 mg/dL — ABNORMAL HIGH (ref 8–27)
CO2: 21 mmol/L (ref 20–29)
Calcium: 9.2 mg/dL (ref 8.7–10.3)
Chloride: 95 mmol/L — ABNORMAL LOW (ref 96–106)
Creatinine, Ser: 1.09 mg/dL — ABNORMAL HIGH (ref 0.57–1.00)
Glucose: 163 mg/dL — ABNORMAL HIGH (ref 70–99)
Potassium: 4.6 mmol/L (ref 3.5–5.2)
Sodium: 135 mmol/L (ref 134–144)
eGFR: 55 mL/min/{1.73_m2} — ABNORMAL LOW (ref >59)

## 2022-09-09 ENCOUNTER — Ambulatory Visit (INDEPENDENT_AMBULATORY_CARE_PROVIDER_SITE_OTHER): Payer: Medicare PPO | Admitting: Internal Medicine

## 2022-09-09 ENCOUNTER — Encounter: Payer: Self-pay | Admitting: Internal Medicine

## 2022-09-09 DIAGNOSIS — E039 Hypothyroidism, unspecified: Secondary | ICD-10-CM | POA: Diagnosis not present

## 2022-09-09 MED ORDER — LEVOTHYROXINE SODIUM 175 MCG PO TABS: 175.0000 ug | ORAL_TABLET | ORAL | 2 refills | Status: DC

## 2022-09-09 MED ORDER — LEVOTHYROXINE SODIUM 150 MCG PO TABS: 150.0000 ug | ORAL_TABLET | ORAL | 2 refills | Status: DC

## 2022-09-09 NOTE — Patient Instructions (Signed)
It was a pleasure to see you today.  Thank you for giving [place] the opportunity to be involved in your care.  Below is a brief recap of your visit and next steps.  We will plan to see you again in 4 weeks.  Summary Start Synthroid in alternating 150 / 175 mcg doses We will follow up 4 weeks for repeat labs
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
## 2022-09-09 NOTE — Assessment & Plan Note (Signed)
Levothyroxine was decreased to 150 mcg daily at her last appointment due to symptoms of heat intolerance, dry skin, diarrhea, and alopecia.  TSH/T4 were repeated last week.  TSH is increased to 15.  T4 remains within normal limits.  Today she states that she has been experience symptoms of constipation and bilateral foot and ankle swelling since decreasing levothyroxine to 150 mcg.  These are symptoms she previously experienced when she was first diagnosed with hypothyroidism. -Difficult situation.  Her labs are consistent with hypothyroidism, however she endorses symptoms of hyperthyroidism with levothyroxine 175 mcg daily.  Through shared decision making, we will try alternating doses of levothyroxine 150/175 mcg. -Follow-up in 1 month for assessment with repeat TSH/T4 to be completed at that time.

## 2022-09-15 ENCOUNTER — Other Ambulatory Visit: Payer: Self-pay

## 2022-09-15 MED ORDER — GLIPIZIDE ER 10 MG PO TB24: ORAL_TABLET | ORAL | 0 refills | Status: DC

## 2022-09-17 ENCOUNTER — Encounter (HOSPITAL_COMMUNITY): Payer: Self-pay

## 2022-09-17 ENCOUNTER — Encounter (HOSPITAL_COMMUNITY)
Admission: RE | Admit: 2022-09-17 | Discharge: 2022-09-17 | Disposition: A | Discharge status: Home / Self Care | Payer: Medicare PPO | Source: Ambulatory Visit | Attending: Internal Medicine | Admitting: Internal Medicine

## 2022-09-17 VITALS — BP 100/56 | HR 80 | Ht 68.0 in | Wt 259.3 lb

## 2022-09-17 DIAGNOSIS — R0609 Other forms of dyspnea: Secondary | ICD-10-CM | POA: Insufficient documentation

## 2022-09-17 LAB — GLUCOSE, CAPILLARY: Glucose-Capillary: 228 mg/dL — ABNORMAL HIGH (ref 70–99)

## 2022-09-17 NOTE — Progress Notes (Signed)
Pulmonary Individual Treatment Plan  Patient Details  Name: Julie Bean MRN: [id number] Date of Birth: [birth year] Referring Provider:   Flowsheet Row CARDIAC REHAB PHASE II ORIENTATION from 09/17/2022 in [place]  Referring Provider Dr. Guy Begin       Initial Encounter Date:  Flowsheet Row CARDIAC REHAB PHASE II ORIENTATION from 09/17/2022 in [place]  Date 09/17/22       Visit Diagnosis: DOE (dyspnea on exertion)  Patient's Home Medications on Admission:   Current Outpatient Medications:    acetaminophen (TYLENOL) 500 MG tablet, Take 1,000 mg by mouth every 8 (eight) hours as needed for moderate pain., Disp: , Rfl:    amLODipine (NORVASC) 5 MG tablet, Take 1 tablet (5 mg total) by mouth daily., Disp: 90 tablet, Rfl: 3   dapagliflozin propanediol (FARXIGA) 10 MG TABS tablet, Take 1 tablet (10 mg total) by mouth daily before breakfast., Disp: 90 tablet, Rfl: 3   diclofenac Sodium (VOLTAREN) 1 % GEL, SMARTSIG:2 Gram(s) Topical 3 Times Daily PRN, Disp: , Rfl:    divalproex (DEPAKOTE) 500 MG DR tablet, Take 500 mg by mouth 3 (three) times daily., Disp: , Rfl:    glipiZIDE (GLUCOTROL XL) 10 MG 24 hr tablet, TAKE ONE TABLET (10 MG TOTAL) BY MOUTH DAILY WITH BREAKFAST., Disp: 90 tablet, Rfl: 0   indomethacin (INDOCIN) 50 MG capsule, Take 1 capsule (50 mg total) by mouth 2 (two) times daily with a meal., Disp: 180 capsule, Rfl: 3   levothyroxine (SYNTHROID) 150 MCG tablet, Take 1 tablet (150 mcg total) by mouth every other day., Disp: 15 tablet, Rfl: 2   levothyroxine (SYNTHROID) 175 MCG tablet, Take 1 tablet (175 mcg total) by mouth every other day., Disp: 15 tablet, Rfl: 2   metFORMIN (GLUCOPHAGE) 500 MG tablet, Take 1 tablet (500 mg total) by mouth 3 (three) times daily., Disp: 270 tablet, Rfl: 3   metoprolol succinate (TOPROL-XL) 25 MG 24 hr tablet, Take 1 tablet (25 mg total) by mouth daily., Disp: 90 tablet, Rfl: 3   Multiple  Vitamins-Minerals (PRESERVISION AREDS 2+MULTI VIT PO), Take 1 tablet by mouth 2 (two) times daily., Disp: , Rfl:    omeprazole (PRILOSEC) 40 MG capsule, Take 1 capsule (40 mg total) by mouth daily., Disp: 90 capsule, Rfl: 3   rosuvastatin (CRESTOR) 10 MG tablet, Take 1 tablet (10 mg total) by mouth daily., Disp: 90 tablet, Rfl: 3   torsemide (DEMADEX) 20 MG tablet, TAKE TWO TABLETS ('40MG'$  TOTAL) BY MOUTH DAILY, Disp: 180 tablet, Rfl: 2   glucose blood (CONTOUR TEST) test strip, Use as instructed once daily testing dx e11.9, Disp: 100 each, Rfl: 5  Past Medical History: Past Medical History:  Diagnosis Date   Arthritis    knees, shoulder, neck and hip   Cataract    [employer] 09/18/2020   CHF (congestive heart failure) (HCC)    Chronic kidney disease    Closed fracture of left proximal humerus 04/07/2018   Diabetes mellitus without complication (HCC)    Type II   DISH (diffuse idiopathic skeletal hyperostosis)    Encounter for well adult exam with abnormal findings 08/10/2022   GERD (gastroesophageal reflux disease)    Headache, hemicrania continua    Hyperlipidemia    [employer] 09/18/2020   Hypertension    Hypothyroidism    Macular degeneration    Macular degeneration    followed by Dr. Manuella Ghazi in [place]   PONV (postoperative nausea and vomiting)    Proximal humerus fracture  Left   Thyroid disease    [employer] 09/18/2020    Tobacco Use: Social History   Tobacco Use  Smoking Status Never  Smokeless Tobacco Never  Tobacco Comments   Both parents smoked    Labs: Review Flowsheet  More data exists      Latest Ref Rng & Units 03/05/2021 06/04/2021 03/12/2022 07/08/2022 07/31/2022  Labs for ITP Cardiac and Pulmonary Rehab  Cholestrol 100 - 199 mg/dL 163  162  168  - 184   LDL (calc) 0 - 99 mg/dL 76  76  61  - 68   HDL-C >39 mg/dL 57  45  49  - 42   Trlycerides 0 - 149 mg/dL 178  252  379  - 481   Hemoglobin A1c 4.8 - 5.6 % 6.3  6.8  8.6  8.6  8.8     Capillary Blood  Glucose: Lab Results  Component Value Date   GLUCAP 228 (H) 09/17/2022   GLUCAP 163 (H) 06/28/2021   GLUCAP 143 (H) 04/07/2018   GLUCAP 114 (H) 04/07/2018   GLUCAP 114 (H) 04/07/2018     Pulmonary Assessment Scores:  Pulmonary Assessment Scores     Row Name 09/17/22 1351         ADL UCSD   ADL Phase Entry     SOB Score total 17     Rest 0     Walk 0     Stairs 4     Bath 0     Dress 0     Shop 1       CAT Score   CAT Score 6       mMRC Score   mMRC Score 2             UCSD: Self-administered rating of dyspnea associated with activities of daily living (ADLs) 6-point scale (0 = "not at all" to 5 = "maximal or unable to do because of breathlessness")  Scoring Scores range from 0 to 120.  Minimally important difference is 5 units  CAT: CAT can identify the health impairment of COPD patients and is better correlated with disease progression.  CAT has a scoring range of zero to 40. The CAT score is classified into four groups of low (less than 10), medium (10 - 20), high (21-30) and very high (31-40) based on the impact level of disease on health status. A CAT score over 10 suggests significant symptoms.  A worsening CAT score could be explained by an exacerbation, poor medication adherence, poor inhaler technique, or progression of COPD or comorbid conditions.  CAT MCID is 2 points  mMRC: mMRC (Modified Medical Research Council) Dyspnea Scale is used to assess the degree of baseline functional disability in patients of respiratory disease due to dyspnea. No minimal important difference is established. A decrease in score of 1 point or greater is considered a positive change.   Pulmonary Function Assessment:   Exercise Target Goals: Exercise Program Goal: Individual exercise prescription set using results from initial 6 min walk test and THRR while considering  patient's activity barriers and safety.   Exercise Prescription Goal: Initial exercise prescription  builds to 30-45 minutes a day of aerobic activity, 2-3 days per week.  Home exercise guidelines will be given to patient during program as part of exercise prescription that the participant will acknowledge.  Activity Barriers & Risk Stratification:  Activity Barriers & Cardiac Risk Stratification - 09/17/22 1303       Activity Barriers & Cardiac  Risk Stratification   Activity Barriers Back Problems;Neck/Spine Problems;Joint Problems;Shortness of Breath;Balance Concerns;History of Falls    Cardiac Risk Stratification High             6 Minute Walk:  6 Minute Walk     Row Name 09/17/22 1406         6 Minute Walk   Phase Initial     Distance 800 feet     Walk Time 6 minutes     # of Rest Breaks 0     MPH 1.51     METS 1.38     RPE 13     Perceived Dyspnea  11     VO2 Peak 4.85     Symptoms No     Comments none     Resting HR 80 bpm     Resting BP 100/56     Resting Oxygen Saturation  96 %     Exercise Oxygen Saturation  during 6 min walk 97 %     Max Ex. HR 115 bpm     Max Ex. BP 120/60     2 Minute Post BP 110/58       Interval HR   1 Minute HR 104     2 Minute HR 107     3 Minute HR 110     4 Minute HR 112     5 Minute HR 114     6 Minute HR 115     2 Minute Post HR 81     Interval Heart Rate? Yes       Interval Oxygen   Interval Oxygen? Yes     Baseline Oxygen Saturation % 96 %     1 Minute Oxygen Saturation % 97 %     1 Minute Liters of Oxygen 0 L     2 Minute Oxygen Saturation % 97 %     2 Minute Liters of Oxygen 0 L     3 Minute Oxygen Saturation % 97 %     3 Minute Liters of Oxygen 0 L     4 Minute Oxygen Saturation % 97 %     4 Minute Liters of Oxygen 0 L     5 Minute Oxygen Saturation % 98 %     5 Minute Liters of Oxygen 0 L     6 Minute Oxygen Saturation % 98 %     6 Minute Liters of Oxygen 0 L     2 Minute Post Oxygen Saturation % 98 %     2 Minute Post Liters of Oxygen 0 L              Oxygen Initial Assessment:  Oxygen Initial  Assessment - 09/17/22 1351       Home Oxygen   Home Oxygen Device None    Sleep Oxygen Prescription None    Home Exercise Oxygen Prescription None    Home Resting Oxygen Prescription None      Initial 6 min Walk   Oxygen Used None      Program Oxygen Prescription   Program Oxygen Prescription None      Intervention   Short Term Goals To learn and understand importance of monitoring SPO2 with pulse oximeter and demonstrate accurate use of the pulse oximeter.;To learn and understand importance of maintaining oxygen saturations>88%;To learn and demonstrate proper pursed lip breathing techniques or other breathing techniques.     Long  Term Goals Verbalizes importance of monitoring SPO2  with pulse oximeter and return demonstration;Maintenance of O2 saturations>88%;Exhibits proper breathing techniques, such as pursed lip breathing or other method taught during program session             Oxygen Re-Evaluation:   Oxygen Discharge (Final Oxygen Re-Evaluation):   Initial Exercise Prescription:  Initial Exercise Prescription - 09/17/22 1400       Date of Initial Exercise RX and Referring Provider   Date 09/17/22    Referring Provider Dr. Guy Begin    Expected Discharge Date 01/20/23      NuStep   Level 1    SPM 60    Minutes 22      Arm Ergometer   Level 1    RPM 40    Minutes 17      Prescription Details   Frequency (times per week) 2    Duration Progress to 30 minutes of continuous aerobic without signs/symptoms of physical distress      Intensity   THRR 40-80% of Max Heartrate 60-120    Ratings of Perceived Exertion 11-13    Perceived Dyspnea 0-4      Resistance Training   Training Prescription Yes    Weight 3    Reps 10-15             Perform Capillary Blood Glucose checks as needed.  Exercise Prescription Changes:   Exercise Comments:   Exercise Goals and Review:   Exercise Goals     Row Name 09/17/22 1410             Exercise  Goals   Increase Physical Activity Yes       Intervention Provide advice, education, support and counseling about physical activity/exercise needs.;Develop an individualized exercise prescription for aerobic and resistive training based on initial evaluation findings, risk stratification, comorbidities and participant's personal goals.       Expected Outcomes Short Term: Attend rehab on a regular basis to increase amount of physical activity.;Long Term: Add in home exercise to make exercise part of routine and to increase amount of physical activity.;Long Term: Exercising regularly at least 3-5 days a week.       Increase Strength and Stamina Yes       Intervention Provide advice, education, support and counseling about physical activity/exercise needs.;Develop an individualized exercise prescription for aerobic and resistive training based on initial evaluation findings, risk stratification, comorbidities and participant's personal goals.       Expected Outcomes Short Term: Increase workloads from initial exercise prescription for resistance, speed, and METs.;Short Term: Perform resistance training exercises routinely during rehab and add in resistance training at home;Long Term: Improve cardiorespiratory fitness, muscular endurance and strength as measured by increased METs and functional capacity (6MWT)       Able to understand and use rate of perceived exertion (RPE) scale Yes       Intervention Provide education and explanation on how to use RPE scale       Expected Outcomes Short Term: Able to use RPE daily in rehab to express subjective intensity level;Long Term:  Able to use RPE to guide intensity level when exercising independently       Knowledge and understanding of Target Heart Rate Range (THRR) Yes       Intervention Provide education and explanation of THRR including how the numbers were predicted and where they are located for reference       Expected Outcomes Short Term: Able to  state/look up THRR;Long Term: Able to use THRR to govern  intensity when exercising independently;Short Term: Able to use daily as guideline for intensity in rehab       Able to check pulse independently Yes       Intervention Provide education and demonstration on how to check pulse in carotid and radial arteries.;Review the importance of being able to check your own pulse for safety during independent exercise       Expected Outcomes Short Term: Able to explain why pulse checking is important during independent exercise;Long Term: Able to check pulse independently and accurately       Understanding of Exercise Prescription Yes       Intervention Provide education, explanation, and written materials on patient's individual exercise prescription       Expected Outcomes Short Term: Able to explain program exercise prescription;Long Term: Able to explain home exercise prescription to exercise independently                Exercise Goals Re-Evaluation :   Discharge Exercise Prescription (Final Exercise Prescription Changes):   Nutrition:  Target Goals: Understanding of nutrition guidelines, daily intake of sodium '1500mg'$ , cholesterol '200mg'$ , calories 30% from fat and 7% or less from saturated fats, daily to have 5 or more servings of fruits and vegetables.  Biometrics:  Pre Biometrics - 09/17/22 1410       Pre Biometrics   Height '5\' 8"'$  (1.727 m)    Weight 117.6 kg    Waist Circumference 52 inches    Hip Circumference 48 inches    Waist to Hip Ratio 1.08 %    BMI (Calculated) 39.43    Triceps Skinfold 40 mm    % Body Fat 52.6 %    Grip Strength 15 kg    Flexibility 0 in    Single Leg Stand 0 seconds              Nutrition Therapy Plan and Nutrition Goals:  Nutrition Therapy & Goals - 09/17/22 1344       Personal Nutrition Goals   Additional Goals? No    Comments Patient scored 36 on her diet assessment. She says she follows a low carb portion control diet similiar to  the exchange DM diet. Handouts provided and explained regarding healthier choices and DM information. We offer 2 educational sessions on heart healthy nutrition with handouts and assistance with RD referral if patient is interested.      Intervention Plan   Intervention Nutrition handout(s) given to patient.    Expected Outcomes Short Term Goal: Understand basic principles of dietary content, such as calories, fat, sodium, cholesterol and nutrients.             Nutrition Assessments:  Nutrition Assessments - 09/17/22 1344       MEDFICTS Scores   Pre Score 36            MEDIFICTS Score Key: ?70 Need to make dietary changes  40-70 Heart Healthy Diet ? 40 Therapeutic Level Cholesterol Diet   Picture Your Plate Scores: <25 Unhealthy dietary pattern with much room for improvement. 41-50 Dietary pattern unlikely to meet recommendations for good health and room for improvement. 51-60 More healthful dietary pattern, with some room for improvement.  >60 Healthy dietary pattern, although there may be some specific behaviors that could be improved.    Nutrition Goals Re-Evaluation:   Nutrition Goals Discharge (Final Nutrition Goals Re-Evaluation):   Psychosocial: Target Goals: Acknowledge presence or absence of significant depression and/or stress, maximize coping skills, provide positive support system. Participant  is able to verbalize types and ability to use techniques and skills needed for reducing stress and depression.  Initial Review & Psychosocial Screening:  Initial Psych Review & Screening - 09/17/22 1352       Initial Review   Current issues with None Identified      Family Dynamics   Good Support System? Yes      Barriers   Psychosocial barriers to participate in program There are no identifiable barriers or psychosocial needs.      Screening Interventions   Interventions Encouraged to exercise;To provide support and resources with identified psychosocial  needs    Expected Outcomes Short Term goal: Identification and review with participant of any Quality of Life or Depression concerns found by scoring the questionnaire.             Quality of Life Scores:  Quality of Life - 09/17/22 1413       Quality of Life   Select Quality of Life      Quality of Life Scores   Health/Function Pre 21.75 %    Socioeconomic Pre 26.43 %    Psych/Spiritual Pre 25.93 %    Family Pre 27.17 %    GLOBAL Pre 24.12 %            Scores of 19 and below usually indicate a poorer quality of life in these areas.  A difference of  2-3 points is a clinically meaningful difference.  A difference of 2-3 points in the total score of the Quality of Life Index has been associated with significant improvement in overall quality of life, self-image, physical symptoms, and general health in studies assessing change in quality of life.   PHQ-9: Review Flowsheet  More data exists      09/17/2022 09/09/2022 08/05/2022 07/08/2022 03/07/2022  Depression screen PHQ 2/9  Decreased Interest 0 0 0 0 0  Down, Depressed, Hopeless 0 0 0 0 0  PHQ - 2 Score 0 0 0 0 0  Altered sleeping 0 - 1 - -  Tired, decreased energy 0 - 1 - -  Change in appetite 0 - 1 - -  Feeling bad or failure about yourself  0 - 0 - -  Trouble concentrating 0 - 0 - -  Moving slowly or fidgety/restless 0 - 0 - -  Suicidal thoughts 0 - 0 - -  PHQ-9 Score 0 - 3 - -  Difficult doing work/chores Not difficult at all - - - -   Interpretation of Total Score  Total Score Depression Severity:  1-4 = Minimal depression, 5-9 = Mild depression, 10-14 = Moderate depression, 15-19 = Moderately severe depression, 20-27 = Severe depression   Psychosocial Evaluation and Intervention:  Psychosocial Evaluation - 09/17/22 1353       Psychosocial Evaluation & Interventions   Interventions Stress management education;Relaxation education;Encouraged to exercise with the program and follow exercise prescription     Comments Patient has no psychosocial barriers identified at her orientation visit. Her PHQ-9 score was 0. She is a retired [employer]  serving for over 30 years. 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 She is originally from [place] so she moved back to [place] when she retired. She and her sister brought a house together after her sister's husband died. She says she has a good support system with her sister and her neices and nephews  and chruch family. She denies any depression or anxiety. She is a very pleasant 72 year old looking forward to participating in the program. She has stairs in her house and she wants to be able to climb them without SOB and improve her stamina.    Expected Outcomes Patient will continue to have no psychosocial barriers identified.    Continue Psychosocial Services  No Follow up required             Psychosocial Re-Evaluation:   Psychosocial Discharge (Final Psychosocial Re-Evaluation):    Education: Education Goals: Education classes will be provided on a weekly basis, covering required topics. Participant will state understanding/return demonstration of topics presented.  Learning Barriers/Preferences:  Learning Barriers/Preferences - 09/17/22 1347       Learning Barriers/Preferences   Learning Barriers None    Learning Preferences Written Material             Education Topics: How Lungs Work and Diseases: - Discuss the anatomy of the lungs and diseases that can affect the lungs, such as COPD.   Exercise: -Discuss the importance of exercise, FITT principles of exercise, normal and abnormal responses to exercise, and how to exercise safely.   Environmental Irritants: -Discuss types of environmental irritants and how to limit exposure to environmental irritants.   Meds/Inhalers and oxygen: - Discuss respiratory medications,  definition of an inhaler and oxygen, and the proper way to use an inhaler and oxygen.   Energy Saving Techniques: - Discuss methods to conserve energy and decrease shortness of breath when performing activities of daily living.    Bronchial Hygiene / Breathing Techniques: - Discuss breathing mechanics, pursed-lip breathing technique,  proper posture, effective ways to clear airways, and other functional breathing techniques   Cleaning Equipment: - Provides group verbal and written instruction about the health risks of elevated stress, cause of high stress, and healthy ways to reduce stress.   Nutrition I: Fats: - Discuss the types of cholesterol, what cholesterol does to the body, and how cholesterol levels can be controlled.   Nutrition II: Labels: -Discuss the different components of food labels and how to read food labels.   Respiratory Infections: - Discuss the signs and symptoms of respiratory infections, ways to prevent respiratory infections, and the importance of seeking medical treatment when having a respiratory infection.   Stress I: Signs and Symptoms: - Discuss the causes of stress, how stress may lead to anxiety and depression, and ways to limit stress.   Stress II: Relaxation: -Discuss relaxation techniques to limit stress.   Oxygen for Home/Travel: - Discuss how to prepare for travel when on oxygen and proper ways to transport and store oxygen to ensure safety.   Knowledge Questionnaire Score:  Knowledge Questionnaire Score - 09/17/22 1347       Knowledge Questionnaire Score   Pre Score 16/18             Core Components/Risk Factors/Patient Goals at Admission:  Personal Goals and Risk Factors at Admission - 09/17/22 1348       Core Components/Risk Factors/Patient Goals on Admission    Weight Management Obesity;Weight Maintenance    Improve shortness of breath with ADL's Yes    Intervention Provide education, individualized exercise plan and  daily activity instruction to help decrease symptoms of SOB with activities of daily living.    Expected Outcomes Short Term: Improve cardiorespiratory fitness to achieve a reduction of symptoms when performing ADLs;Long Term: Be able to perform more ADLs without symptoms or delay  the onset of symptoms    Diabetes Yes    Intervention Provide education about signs/symptoms and action to take for hypo/hyperglycemia.;Provide education about proper nutrition, including hydration, and aerobic/resistive exercise prescription along with prescribed medications to achieve blood glucose in normal ranges: Fasting glucose 65-99 mg/dL    Expected Outcomes Short Term: Participant verbalizes understanding of the signs/symptoms and immediate care of hyper/hypoglycemia, proper foot care and importance of medication, aerobic/resistive exercise and nutrition plan for blood glucose control.;Long Term: Attainment of HbA1C < 7%.    Heart Failure Yes    Intervention Provide a combined exercise and nutrition program that is supplemented with education, support and counseling about heart failure. Directed toward relieving symptoms such as shortness of breath, decreased exercise tolerance, and extremity edema.    Expected Outcomes Short term: Attendance in program 2-3 days a week with increased exercise capacity. Reported lower sodium intake. Reported increased fruit and vegetable intake. Reports medication compliance.    Personal Goal Other Yes    Personal Goal Patient wants to improve her stamina and to be able to walk up stairs without SOB.    Intervention Patient will attend PR 2 days/week with exercise and education.    Expected Outcomes Patient will complete the program.             Core Components/Risk Factors/Patient Goals Review:    Core Components/Risk Factors/Patient Goals at Discharge (Final Review):    ITP Comments:   Comments: Patient arrived for 1st visit/orientation/education at 1230. Patient was  referred to PR by Dr. Gasper Sells. due to DOE (R06.09). During orientation advised patient on arrival and appointment times what to wear, what to do before, during and after exercise. Reviewed attendance and class policy.  Pt is scheduled to return Pulmonary Rehab on 09/23/22 at 1530. Pt was advised to come to class 15 minutes before class starts.  Discussed RPE/Dpysnea scales. Patient participated in warm up stretches. Patient was able to complete 6 minute walk test.  Patient was measured for the equipment. Discussed equipment safety with patient. Took patient pre-anthropometric measurements. Patient finished visit at 1400.

## 2022-09-23 ENCOUNTER — Encounter (HOSPITAL_COMMUNITY)
Admission: RE | Admit: 2022-09-23 | Discharge: 2022-09-23 | Disposition: A | Discharge status: Home / Self Care | Payer: Medicare PPO | Source: Ambulatory Visit | Attending: Internal Medicine | Admitting: Internal Medicine

## 2022-09-23 DIAGNOSIS — R0609 Other forms of dyspnea: Secondary | ICD-10-CM | POA: Diagnosis not present

## 2022-09-23 LAB — GLUCOSE, CAPILLARY: Glucose-Capillary: 239 mg/dL — ABNORMAL HIGH (ref 70–99)

## 2022-09-23 NOTE — Progress Notes (Signed)
Daily Session Note  Patient Details  Name: Julie Bean MRN: [id number] Date of Birth: 07-20-1951 Referring Provider:   Flowsheet Row CARDIAC REHAB PHASE II ORIENTATION from 09/17/2022 in [place]  Referring Provider Dr. Guy Begin       Encounter Date: 09/23/2022  Check In:  Session Check In - 09/23/22 1330       Check-In   Supervising physician immediately available to respond to emergencies [facility] MD immediately available    Physician(s) Dr Domenic Polite    Location [facility]    Staff Present Leana Roe, BS, Exercise Physiologist;Whisper Kurka Hassell Done, RN, BSN;Dalton Fletcher MHA, MS, ACSM-CEP    Virtual Visit No    Medication changes reported     No    Fall or balance concerns reported    Yes    Comments Patient reports poor balance and has a history of falls.    Tobacco Cessation No Change    Warm-up and Cool-down Performed as group-led instruction    Resistance Training Performed Yes    VAD Patient? No    PAD/SET Patient? No      Pain Assessment   Currently in Pain? No/denies    Pain Score 0-No pain    Multiple Pain Sites No             Capillary Blood Glucose: Results for orders placed or performed during the hospital encounter of 09/23/22 (from the past 24 hour(s))  Glucose, capillary     Status: Abnormal   Collection Time: 09/23/22  1:20 PM  Result Value Ref Range   Glucose-Capillary 239 (H) 70 - 99 mg/dL      Social History   Tobacco Use  Smoking Status Never  Smokeless Tobacco Never  Tobacco Comments   Both parents smoked    Goals Met:  Proper associated with RPD/PD & O2 Sat Independence with exercise equipment Using PLB without cueing & demonstrates good technique Exercise tolerated well Queuing for purse lip breathing No report of concerns or symptoms today Strength training completed today  Goals Unmet:  Not Applicable  Comments: Checkout at 1430.   Dr. Kathie Dike is Medical Director  for [facility].

## 2022-09-25 ENCOUNTER — Encounter (HOSPITAL_COMMUNITY)
Admission: RE | Admit: 2022-09-25 | Discharge: 2022-09-25 | Disposition: A | Discharge status: Home / Self Care | Payer: Medicare PPO | Source: Ambulatory Visit | Attending: Internal Medicine | Admitting: Internal Medicine

## 2022-09-25 DIAGNOSIS — R0609 Other forms of dyspnea: Secondary | ICD-10-CM

## 2022-09-25 LAB — GLUCOSE, CAPILLARY: Glucose-Capillary: 243 mg/dL — ABNORMAL HIGH (ref 70–99)

## 2022-09-25 NOTE — Progress Notes (Signed)
Daily Session Note  Patient Details  Name: Julie Bean MRN: [id number] Date of Birth: [birth year] Referring Provider:   Flowsheet Row CARDIAC REHAB PHASE II ORIENTATION from 09/17/2022 in [place]  Referring Provider Dr. Guy Begin       Encounter Date: 09/25/2022  Check In:  Session Check In - 09/25/22 1330       Check-In   Supervising physician immediately available to respond to emergencies [facility] MD immediately available    Physician(s) Dr Domenic Polite    Location [facility]    Staff Present Leana Roe, BS, Exercise Physiologist;Mccade Sullenberger BSN, RN;Daphyne Hassell Done, RN, BSN    Virtual Visit No    Medication changes reported     No    Fall or balance concerns reported    Yes    Comments Patient reports poor balance and has a history of falls.    Tobacco Cessation No Change    Warm-up and Cool-down Performed as group-led instruction    Resistance Training Performed Yes    VAD Patient? No    PAD/SET Patient? No      Pain Assessment   Currently in Pain? Yes    Pain Score 3     Pain Location Shoulder    Pain Orientation Left    Pain Descriptors / Indicators Aching    Pain Type Chronic pain    Pain Onset More than a month ago    Pain Frequency Constant    Multiple Pain Sites No             Capillary Blood Glucose: Results for orders placed or performed during the hospital encounter of 09/25/22 (from the past 24 hour(s))  Glucose, capillary     Status: Abnormal   Collection Time: 09/25/22  1:20 PM  Result Value Ref Range   Glucose-Capillary 243 (H) 70 - 99 mg/dL      Social History   Tobacco Use  Smoking Status Never  Smokeless Tobacco Never  Tobacco Comments   Both parents smoked    Goals Met:  Proper associated with RPD/PD & O2 Sat Independence with exercise equipment Using PLB without cueing & demonstrates good technique Exercise tolerated well Queuing for purse lip breathing No report of  concerns or symptoms today Strength training completed today  Goals Unmet:  Not Applicable  Comments: check out at 14:30   Dr. Kathie Dike is Medical Director for [facility].

## 2022-09-30 ENCOUNTER — Encounter (HOSPITAL_COMMUNITY)
Admission: RE | Admit: 2022-09-30 | Discharge: 2022-09-30 | Disposition: A | Discharge status: Home / Self Care | Payer: Medicare PPO | Source: Ambulatory Visit | Attending: Internal Medicine | Admitting: Internal Medicine

## 2022-09-30 VITALS — Wt 257.9 lb

## 2022-09-30 DIAGNOSIS — R0609 Other forms of dyspnea: Secondary | ICD-10-CM | POA: Diagnosis not present

## 2022-09-30 LAB — GLUCOSE, CAPILLARY: Glucose-Capillary: 208 mg/dL — ABNORMAL HIGH (ref 70–99)

## 2022-09-30 NOTE — Progress Notes (Signed)
Daily Session Note  Patient Details  Name: Shanetta Nicolls MRN: [id number] Date of Birth: 06-Sep-1950 Referring Provider:   Flowsheet Row CARDIAC REHAB PHASE II ORIENTATION from 09/17/2022 in [place]  Referring Provider Dr. Guy Begin       Encounter Date: 09/30/2022  Check In:  Session Check In - 09/30/22 1336       Check-In   Supervising physician immediately available to respond to emergencies [facility] MD immediately available    Physician(s) Dr Dellia Cloud    Location [facility]    Staff Present Leana Roe, BS, Exercise Physiologist;Royalty Domagala Hassell Done, RN, BSN;Phyllis Billingsley, RN    Virtual Visit No    Medication changes reported     No    Fall or balance concerns reported    Yes    Comments Patient reports poor balance and has a history of falls.    Tobacco Cessation No Change    Warm-up and Cool-down Performed as group-led instruction    Resistance Training Performed Yes    VAD Patient? No    PAD/SET Patient? No      Pain Assessment   Currently in Pain? No/denies    Pain Score 0-No pain    Multiple Pain Sites No             Capillary Blood Glucose: Results for orders placed or performed during the hospital encounter of 09/30/22 (from the past 24 hour(s))  Glucose, capillary     Status: Abnormal   Collection Time: 09/30/22  1:23 PM  Result Value Ref Range   Glucose-Capillary 208 (H) 70 - 99 mg/dL      Social History   Tobacco Use  Smoking Status Never  Smokeless Tobacco Never  Tobacco Comments   Both parents smoked    Goals Met:  Proper associated with RPD/PD & O2 Sat Independence with exercise equipment Using PLB without cueing & demonstrates good technique Exercise tolerated well Queuing for purse lip breathing No report of concerns or symptoms today Strength training completed today  Goals Unmet:  Not Applicable  Comments: checkout at 1430.   Dr. Kathie Dike is Medical Director for [facility] Pulmonary Re[facility].

## 2022-10-01 NOTE — Progress Notes (Signed)
Pulmonary Individual Treatment Plan  Patient Details  Name: Julie Bean MRN: [id number] Date of Birth: May 07, 1951 Referring Provider:   Flowsheet Row CARDIAC REHAB PHASE II ORIENTATION from 09/17/2022 in [facility]  Referring Provider Dr. Guy Begin       Initial Encounter Date:  Flowsheet Row CARDIAC REHAB PHASE II ORIENTATION from 09/17/2022 in [facility]  Date 09/17/22       Visit Diagnosis: DOE (dyspnea on exertion)  Patient's Home Medications on Admission:   Current Outpatient Medications:    acetaminophen (TYLENOL) 500 MG tablet, Take 1,000 mg by mouth every 8 (eight) hours as needed for moderate pain., Disp: , Rfl:    amLODipine (NORVASC) 5 MG tablet, Take 1 tablet (5 mg total) by mouth daily., Disp: 90 tablet, Rfl: 3   dapagliflozin propanediol (FARXIGA) 10 MG TABS tablet, Take 1 tablet (10 mg total) by mouth daily before breakfast., Disp: 90 tablet, Rfl: 3   diclofenac Sodium (VOLTAREN) 1 % GEL, SMARTSIG:2 Gram(s) Topical 3 Times Daily PRN, Disp: , Rfl:    divalproex (DEPAKOTE) 500 MG DR tablet, Take 500 mg by mouth 3 (three) times daily., Disp: , Rfl:    glipiZIDE (GLUCOTROL XL) 10 MG 24 hr tablet, TAKE ONE TABLET (10 MG TOTAL) BY MOUTH DAILY WITH BREAKFAST., Disp: 90 tablet, Rfl: 0   glucose blood (CONTOUR TEST) test strip, Use as instructed once daily testing dx e11.9, Disp: 100 each, Rfl: 5   indomethacin (INDOCIN) 50 MG capsule, Take 1 capsule (50 mg total) by mouth 2 (two) times daily with a meal., Disp: 180 capsule, Rfl: 3   levothyroxine (SYNTHROID) 150 MCG tablet, Take 1 tablet (150 mcg total) by mouth every other day., Disp: 15 tablet, Rfl: 2   levothyroxine (SYNTHROID) 175 MCG tablet, Take 1 tablet (175 mcg total) by mouth every other day., Disp: 15 tablet, Rfl: 2   metFORMIN (GLUCOPHAGE) 500 MG tablet, Take 1 tablet (500 mg total) by mouth 3 (three) times daily., Disp: 270 tablet, Rfl: 3   metoprolol succinate  (TOPROL-XL) 25 MG 24 hr tablet, Take 1 tablet (25 mg total) by mouth daily., Disp: 90 tablet, Rfl: 3   Multiple Vitamins-Minerals (PRESERVISION AREDS 2+MULTI VIT PO), Take 1 tablet by mouth 2 (two) times daily., Disp: , Rfl:    omeprazole (PRILOSEC) 40 MG capsule, Take 1 capsule (40 mg total) by mouth daily., Disp: 90 capsule, Rfl: 3   rosuvastatin (CRESTOR) 10 MG tablet, Take 1 tablet (10 mg total) by mouth daily., Disp: 90 tablet, Rfl: 3   torsemide (DEMADEX) 20 MG tablet, TAKE TWO TABLETS ('40MG'$  TOTAL) BY MOUTH DAILY, Disp: 180 tablet, Rfl: 2  Past Medical History: Past Medical History:  Diagnosis Date   Arthritis    knees, shoulder, neck and hip   Cataract    [employer] 09/18/2020   CHF (congestive heart failure) (HCC)    Chronic kidney disease    Closed fracture of left proximal humerus 04/07/2018   Diabetes mellitus without complication (HCC)    Type II   DISH (diffuse idiopathic skeletal hyperostosis)    Encounter for well adult exam with abnormal findings 08/10/2022   GERD (gastroesophageal reflux disease)    Headache, hemicrania continua    Hyperlipidemia    [employer] 09/18/2020   Hypertension    Hypothyroidism    Macular degeneration    Macular degeneration    followed by Dr. Manuella Ghazi in [place]   PONV (postoperative nausea and vomiting)    Proximal humerus fracture  Left   Thyroid disease    [employer] 09/18/2020    Tobacco Use: Social History   Tobacco Use  Smoking Status Never  Smokeless Tobacco Never  Tobacco Comments   Both parents smoked    Labs: Review Flowsheet  More data exists      Latest Ref Rng & Units 03/05/2021 06/04/2021 03/12/2022 07/08/2022 07/31/2022  Labs for ITP Cardiac and Pulmonary Rehab  Cholestrol 100 - 199 mg/dL 163  162  168  - 184   LDL (calc) 0 - 99 mg/dL 76  76  61  - 68   HDL-C >39 mg/dL 57  45  49  - 42   Trlycerides 0 - 149 mg/dL 178  252  379  - 481   Hemoglobin A1c 4.8 - 5.6 % 6.3  6.8  8.6  8.6  8.8     Capillary Blood  Glucose: Lab Results  Component Value Date   GLUCAP 208 (H) 09/30/2022   GLUCAP 243 (H) 09/25/2022   GLUCAP 239 (H) 09/23/2022   GLUCAP 228 (H) 09/17/2022   GLUCAP 163 (H) 06/28/2021     Pulmonary Assessment Scores:  Pulmonary Assessment Scores     Row Name 09/17/22 1351         ADL UCSD   ADL Phase Entry     SOB Score total 17     Rest 0     Walk 0     Stairs 4     Bath 0     Dress 0     Shop 1       CAT Score   CAT Score 6       mMRC Score   mMRC Score 2             UCSD: Self-administered rating of dyspnea associated with activities of daily living (ADLs) 6-point scale (0 = "not at all" to 5 = "maximal or unable to do because of breathlessness")  Scoring Scores range from 0 to 120.  Minimally important difference is 5 units  CAT: CAT can identify the health impairment of COPD patients and is better correlated with disease progression.  CAT has a scoring range of zero to 40. The CAT score is classified into four groups of low (less than 10), medium (10 - 20), high (21-30) and very high (31-40) based on the impact level of disease on health status. A CAT score over 10 suggests significant symptoms.  A worsening CAT score could be explained by an exacerbation, poor medication adherence, poor inhaler technique, or progression of COPD or comorbid conditions.  CAT MCID is 2 points  mMRC: mMRC (Modified Medical Research Council) Dyspnea Scale is used to assess the degree of baseline functional disability in patients of respiratory disease due to dyspnea. No minimal important difference is established. A decrease in score of 1 point or greater is considered a positive change.   Pulmonary Function Assessment:   Exercise Target Goals: Exercise Program Goal: Individual exercise prescription set using results from initial 6 min walk test and THRR while considering  patient's activity barriers and safety.   Exercise Prescription Goal: Initial exercise prescription  builds to 30-45 minutes a day of aerobic activity, 2-3 days per week.  Home exercise guidelines will be given to patient during program as part of exercise prescription that the participant will acknowledge.  Activity Barriers & Risk Stratification:  Activity Barriers & Cardiac Risk Stratification - 09/17/22 1303       Activity Barriers & Cardiac  Risk Stratification   Activity Barriers Back Problems;Neck/Spine Problems;Joint Problems;Shortness of Breath;Balance Concerns;History of Falls    Cardiac Risk Stratification High             6 Minute Walk:  6 Minute Walk     Row Name 09/17/22 1406         6 Minute Walk   Phase Initial     Distance 800 feet     Walk Time 6 minutes     # of Rest Breaks 0     MPH 1.51     METS 1.38     RPE 13     Perceived Dyspnea  11     VO2 Peak 4.85     Symptoms No     Comments none     Resting HR 80 bpm     Resting BP 100/56     Resting Oxygen Saturation  96 %     Exercise Oxygen Saturation  during 6 min walk 97 %     Max Ex. HR 115 bpm     Max Ex. BP 120/60     2 Minute Post BP 110/58       Interval HR   1 Minute HR 104     2 Minute HR 107     3 Minute HR 110     4 Minute HR 112     5 Minute HR 114     6 Minute HR 115     2 Minute Post HR 81     Interval Heart Rate? Yes       Interval Oxygen   Interval Oxygen? Yes     Baseline Oxygen Saturation % 96 %     1 Minute Oxygen Saturation % 97 %     1 Minute Liters of Oxygen 0 L     2 Minute Oxygen Saturation % 97 %     2 Minute Liters of Oxygen 0 L     3 Minute Oxygen Saturation % 97 %     3 Minute Liters of Oxygen 0 L     4 Minute Oxygen Saturation % 97 %     4 Minute Liters of Oxygen 0 L     5 Minute Oxygen Saturation % 98 %     5 Minute Liters of Oxygen 0 L     6 Minute Oxygen Saturation % 98 %     6 Minute Liters of Oxygen 0 L     2 Minute Post Oxygen Saturation % 98 %     2 Minute Post Liters of Oxygen 0 L              Oxygen Initial Assessment:  Oxygen Initial  Assessment - 09/17/22 1351       Home Oxygen   Home Oxygen Device None    Sleep Oxygen Prescription None    Home Exercise Oxygen Prescription None    Home Resting Oxygen Prescription None      Initial 6 min Walk   Oxygen Used None      Program Oxygen Prescription   Program Oxygen Prescription None      Intervention   Short Term Goals To learn and understand importance of monitoring SPO2 with pulse oximeter and demonstrate accurate use of the pulse oximeter.;To learn and understand importance of maintaining oxygen saturations>88%;To learn and demonstrate proper pursed lip breathing techniques or other breathing techniques.     Long  Term Goals Verbalizes importance of monitoring SPO2  with pulse oximeter and return demonstration;Maintenance of O2 saturations>88%;Exhibits proper breathing techniques, such as pursed lip breathing or other method taught during program session             Oxygen Re-Evaluation:  Oxygen Re-Evaluation     Row Name 09/30/22 1552             Program Oxygen Prescription   Program Oxygen Prescription None         Home Oxygen   Home Oxygen Device None       Sleep Oxygen Prescription None       Home Exercise Oxygen Prescription None       Home Resting Oxygen Prescription None       Compliance with Home Oxygen Use Yes         Goals/Expected Outcomes   Short Term Goals To learn and understand importance of monitoring SPO2 with pulse oximeter and demonstrate accurate use of the pulse oximeter.;To learn and understand importance of maintaining oxygen saturations>88%;To learn and demonstrate proper pursed lip breathing techniques or other breathing techniques.        Long  Term Goals Verbalizes importance of monitoring SPO2 with pulse oximeter and return demonstration;Maintenance of O2 saturations>88%;Exhibits proper breathing techniques, such as pursed lip breathing or other method taught during program session       Goals/Expected Outcomes compliance                 Oxygen Discharge (Final Oxygen Re-Evaluation):  Oxygen Re-Evaluation - 09/30/22 1552       Program Oxygen Prescription   Program Oxygen Prescription None      Home Oxygen   Home Oxygen Device None    Sleep Oxygen Prescription None    Home Exercise Oxygen Prescription None    Home Resting Oxygen Prescription None    Compliance with Home Oxygen Use Yes      Goals/Expected Outcomes   Short Term Goals To learn and understand importance of monitoring SPO2 with pulse oximeter and demonstrate accurate use of the pulse oximeter.;To learn and understand importance of maintaining oxygen saturations>88%;To learn and demonstrate proper pursed lip breathing techniques or other breathing techniques.     Long  Term Goals Verbalizes importance of monitoring SPO2 with pulse oximeter and return demonstration;Maintenance of O2 saturations>88%;Exhibits proper breathing techniques, such as pursed lip breathing or other method taught during program session    Goals/Expected Outcomes compliance             Initial Exercise Prescription:  Initial Exercise Prescription - 09/17/22 1400       Date of Initial Exercise RX and Referring Provider   Date 09/17/22    Referring Provider Dr. Guy Begin    Expected Discharge Date 01/20/23      NuStep   Level 1    SPM 60    Minutes 22      Arm Ergometer   Level 1    RPM 40    Minutes 17      Prescription Details   Frequency (times per week) 2    Duration Progress to 30 minutes of continuous aerobic without signs/symptoms of physical distress      Intensity   THRR 40-80% of Max Heartrate 60-120    Ratings of Perceived Exertion 11-13    Perceived Dyspnea 0-4      Resistance Training   Training Prescription Yes    Weight 3    Reps 10-15  Perform Capillary Blood Glucose checks as needed.  Exercise Prescription Changes:   Exercise Prescription Changes     Row Name 09/30/22 1500             Response  to Exercise   Blood Pressure (Admit) 114/66       Blood Pressure (Exercise) 126/68       Blood Pressure (Exit) 120/62       Heart Rate (Admit) 82 bpm       Heart Rate (Exercise) 85 bpm       Heart Rate (Exit) 85 bpm       Oxygen Saturation (Admit) 96 %       Oxygen Saturation (Exercise) 97 %       Oxygen Saturation (Exit) 95 %       Rating of Perceived Exertion (Exercise) 12       Perceived Dyspnea (Exercise) 12       Duration Continue with 30 min of aerobic exercise without signs/symptoms of physical distress.       Intensity THRR unchanged         Progression   Progression Continue to progress workloads to maintain intensity without signs/symptoms of physical distress.         Resistance Training   Training Prescription Yes       Weight 2       Reps 10-15       Time 10 Minutes         NuStep   Level 1       SPM 69       Minutes 17       METs 1.7         Arm Ergometer   Level 1       RPM 21       Minutes 17       METs 1.4                Exercise Comments:   Exercise Goals and Review:   Exercise Goals     Row Name 09/17/22 1410 09/30/22 1554           Exercise Goals   Increase Physical Activity Yes Yes      Intervention Provide advice, education, support and counseling about physical activity/exercise needs.;Develop an individualized exercise prescription for aerobic and resistive training based on initial evaluation findings, risk stratification, comorbidities and participant's personal goals. Provide advice, education, support and counseling about physical activity/exercise needs.;Develop an individualized exercise prescription for aerobic and resistive training based on initial evaluation findings, risk stratification, comorbidities and participant's personal goals.      Expected Outcomes Short Term: Attend rehab on a regular basis to increase amount of physical activity.;Long Term: Add in home exercise to make exercise part of routine and to increase  amount of physical activity.;Long Term: Exercising regularly at least 3-5 days a week. Short Term: Attend rehab on a regular basis to increase amount of physical activity.;Long Term: Add in home exercise to make exercise part of routine and to increase amount of physical activity.;Long Term: Exercising regularly at least 3-5 days a week.      Increase Strength and Stamina Yes Yes      Intervention Provide advice, education, support and counseling about physical activity/exercise needs.;Develop an individualized exercise prescription for aerobic and resistive training based on initial evaluation findings, risk stratification, comorbidities and participant's personal goals. Provide advice, education, support and counseling about physical activity/exercise needs.;Develop an individualized exercise prescription for aerobic and  resistive training based on initial evaluation findings, risk stratification, comorbidities and participant's personal goals.      Expected Outcomes Short Term: Increase workloads from initial exercise prescription for resistance, speed, and METs.;Short Term: Perform resistance training exercises routinely during rehab and add in resistance training at home;Long Term: Improve cardiorespiratory fitness, muscular endurance and strength as measured by increased METs and functional capacity (6MWT) Short Term: Increase workloads from initial exercise prescription for resistance, speed, and METs.;Short Term: Perform resistance training exercises routinely during rehab and add in resistance training at home;Long Term: Improve cardiorespiratory fitness, muscular endurance and strength as measured by increased METs and functional capacity (6MWT)      Able to understand and use rate of perceived exertion (RPE) scale Yes Yes      Intervention Provide education and explanation on how to use RPE scale Provide education and explanation on how to use RPE scale      Expected Outcomes Short Term: Able to use  RPE daily in rehab to express subjective intensity level;Long Term:  Able to use RPE to guide intensity level when exercising independently Short Term: Able to use RPE daily in rehab to express subjective intensity level;Long Term:  Able to use RPE to guide intensity level when exercising independently      Knowledge and understanding of Target Heart Rate Range (THRR) Yes Yes      Intervention Provide education and explanation of THRR including how the numbers were predicted and where they are located for reference Provide education and explanation of THRR including how the numbers were predicted and where they are located for reference      Expected Outcomes Short Term: Able to state/look up THRR;Long Term: Able to use THRR to govern intensity when exercising independently;Short Term: Able to use daily as guideline for intensity in rehab Short Term: Able to state/look up THRR;Long Term: Able to use THRR to govern intensity when exercising independently;Short Term: Able to use daily as guideline for intensity in rehab      Able to check pulse independently Yes Yes      Intervention Provide education and demonstration on how to check pulse in carotid and radial arteries.;Review the importance of being able to check your own pulse for safety during independent exercise Provide education and demonstration on how to check pulse in carotid and radial arteries.;Review the importance of being able to check your own pulse for safety during independent exercise      Expected Outcomes Short Term: Able to explain why pulse checking is important during independent exercise;Long Term: Able to check pulse independently and accurately Short Term: Able to explain why pulse checking is important during independent exercise;Long Term: Able to check pulse independently and accurately      Understanding of Exercise Prescription Yes Yes      Intervention Provide education, explanation, and written materials on patient's  individual exercise prescription Provide education, explanation, and written materials on patient's individual exercise prescription      Expected Outcomes Short Term: Able to explain program exercise prescription;Long Term: Able to explain home exercise prescription to exercise independently Short Term: Able to explain program exercise prescription;Long Term: Able to explain home exercise prescription to exercise independently               Exercise Goals Re-Evaluation :  Exercise Goals Re-Evaluation     Row Name 09/30/22 1555             Exercise Goal Re-Evaluation   Exercise Goals Review  Increase Physical Activity;Increase Strength and Stamina;Able to understand and use rate of perceived exertion (RPE) scale;Able to understand and use Dyspnea scale;Knowledge and understanding of Target Heart Rate Range (THRR);Understanding of Exercise Prescription       Comments Pt has completed 4 sessions of PR. She is motivated to progress in the program. She is deconditioned, but she should be able to progress well. She is currently exercising at 1.7 METs on the stepper. Will continue to monitor and progress as able.       Expected Outcomes Through exercise at rehab and at home, the patient will meet their stated goals.                Discharge Exercise Prescription (Final Exercise Prescription Changes):  Exercise Prescription Changes - 09/30/22 1500       Response to Exercise   Blood Pressure (Admit) 114/66    Blood Pressure (Exercise) 126/68    Blood Pressure (Exit) 120/62    Heart Rate (Admit) 82 bpm    Heart Rate (Exercise) 85 bpm    Heart Rate (Exit) 85 bpm    Oxygen Saturation (Admit) 96 %    Oxygen Saturation (Exercise) 97 %    Oxygen Saturation (Exit) 95 %    Rating of Perceived Exertion (Exercise) 12    Perceived Dyspnea (Exercise) 12    Duration Continue with 30 min of aerobic exercise without signs/symptoms of physical distress.    Intensity THRR unchanged       Progression   Progression Continue to progress workloads to maintain intensity without signs/symptoms of physical distress.      Resistance Training   Training Prescription Yes    Weight 2    Reps 10-15    Time 10 Minutes      NuStep   Level 1    SPM 69    Minutes 17    METs 1.7      Arm Ergometer   Level 1    RPM 21    Minutes 17    METs 1.4             Nutrition:  Target Goals: Understanding of nutrition guidelines, daily intake of sodium '1500mg'$ , cholesterol '200mg'$ , calories 30% from fat and 7% or less from saturated fats, daily to have 5 or more servings of fruits and vegetables.  Biometrics:  Pre Biometrics - 09/17/22 1410       Pre Biometrics   Height '5\' 8"'$  (1.727 m)    Weight 117.6 kg    Waist Circumference 52 inches    Hip Circumference 48 inches    Waist to Hip Ratio 1.08 %    BMI (Calculated) 39.43    Triceps Skinfold 40 mm    % Body Fat 52.6 %    Grip Strength 15 kg    Flexibility 0 in    Single Leg Stand 0 seconds              Nutrition Therapy Plan and Nutrition Goals:  Nutrition Therapy & Goals - 09/17/22 1344       Personal Nutrition Goals   Additional Goals? No    Comments Patient scored 36 on her diet assessment. She says she follows a low carb portion control diet similiar to the exchange DM diet. Handouts provided and explained regarding healthier choices and DM information. We offer 2 educational sessions on heart healthy nutrition with handouts and assistance with RD referral if patient is interested.      Intervention Plan  Intervention Nutrition handout(s) given to patient.    Expected Outcomes Short Term Goal: Understand basic principles of dietary content, such as calories, fat, sodium, cholesterol and nutrients.             Nutrition Assessments:  Nutrition Assessments - 09/17/22 1344       MEDFICTS Scores   Pre Score 36            MEDIFICTS Score Key: ?70 Need to make dietary changes  40-70 Heart Healthy  Diet ? 40 Therapeutic Level Cholesterol Diet   Picture Your Plate Scores: <33 Unhealthy dietary pattern with much room for improvement. 41-50 Dietary pattern unlikely to meet recommendations for good health and room for improvement. 51-60 More healthful dietary pattern, with some room for improvement.  >60 Healthy dietary pattern, although there may be some specific behaviors that could be improved.    Nutrition Goals Re-Evaluation:   Nutrition Goals Discharge (Final Nutrition Goals Re-Evaluation):   Psychosocial: Target Goals: Acknowledge presence or absence of significant depression and/or stress, maximize coping skills, provide positive support system. Participant is able to verbalize types and ability to use techniques and skills needed for reducing stress and depression.  Initial Review & Psychosocial Screening:  Initial Psych Review & Screening - 09/17/22 1352       Initial Review   Current issues with None Identified      Family Dynamics   Good Support System? Yes      Barriers   Psychosocial barriers to participate in program There are no identifiable barriers or psychosocial needs.      Screening Interventions   Interventions Encouraged to exercise;To provide support and resources with identified psychosocial needs    Expected Outcomes Short Term goal: Identification and review with participant of any Quality of Life or Depression concerns found by scoring the questionnaire.             Quality of Life Scores:  Quality of Life - 09/17/22 1413       Quality of Life   Select Quality of Life      Quality of Life Scores   Health/Function Pre 21.75 %    Socioeconomic Pre 26.43 %    Psych/Spiritual Pre 25.93 %    Family Pre 27.17 %    GLOBAL Pre 24.12 %            Scores of 19 and below usually indicate a poorer quality of life in these areas.  A difference of  2-3 points is a clinically meaningful difference.  A difference of 2-3 points in the total  score of the Quality of Life Index has been associated with significant improvement in overall quality of life, self-image, physical symptoms, and general health in studies assessing change in quality of life.   PHQ-9: Review Flowsheet  More data exists      09/17/2022 09/09/2022 08/05/2022 07/08/2022 03/07/2022  Depression screen PHQ 2/9  Decreased Interest 0 0 0 0 0  Down, Depressed, Hopeless 0 0 0 0 0  PHQ - 2 Score 0 0 0 0 0  Altered sleeping 0 - 1 - -  Tired, decreased energy 0 - 1 - -  Change in appetite 0 - 1 - -  Feeling bad or failure about yourself  0 - 0 - -  Trouble concentrating 0 - 0 - -  Moving slowly or fidgety/restless 0 - 0 - -  Suicidal thoughts 0 - 0 - -  PHQ-9 Score 0 - 3 - -  Difficult doing work/chores Not difficult at all - - - -   Interpretation of Total Score  Total Score Depression Severity:  1-4 = Minimal depression, 5-9 = Mild depression, 10-14 = Moderate depression, 15-19 = Moderately severe depression, 20-27 = Severe depression   Psychosocial Evaluation and Intervention:  Psychosocial Evaluation - 09/17/22 1353       Psychosocial Evaluation & Interventions   Interventions Stress management education;Relaxation education;Encouraged to exercise with the program and follow exercise prescription    Comments Patient has no psychosocial barriers identified at her orientation visit. Her PHQ-9 score was 0. She is a retired [employer]  serving for over 30 years. 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 She is originally from [place] so she moved back to [place] when she retired. She and her sister brought a house together after her sister's husband died. She says she has a good support system with her sister and her neices and nephews and chruch family. She denies any depression or anxiety. She is a very pleasant 72 year old looking forward to  participating in the program. She has stairs in her house and she wants to be able to climb them without SOB and improve her stamina.    Expected Outcomes Patient will continue to have no psychosocial barriers identified.    Continue Psychosocial Services  No Follow up required             Psychosocial Re-Evaluation:  Psychosocial Re-Evaluation     [place] Name 09/24/22 1421             Psychosocial Re-Evaluation   Current issues with None Identified       Expected Outcomes Patient is new to the program, and she has only completed 1 session.  She has no psychosocial barriers.  We will continue to monitor her progress.       Interventions Stress management education;Relaxation education;Encouraged to attend Pulmonary Rehabilitation for the exercise       Continue Psychosocial Services  No Follow up required                Psychosocial Discharge (Final Psychosocial Re-Evaluation):  Psychosocial Re-Evaluation - 09/24/22 1421       Psychosocial Re-Evaluation   Current issues with None Identified    Expected Outcomes Patient is new to the program, and she has only completed 1 session.  She has no psychosocial barriers.  We will continue to monitor her progress.    Interventions Stress management education;Relaxation education;Encouraged to attend Pulmonary Rehabilitation for the exercise    Continue Psychosocial Services  No Follow up required              Education: Education Goals: Education classes will be provided on a weekly basis, covering required topics. Participant will state understanding/return demonstration of topics presented.  Learning Barriers/Preferences:  Learning Barriers/Preferences - 09/17/22 1347       Learning Barriers/Preferences   Learning Barriers None    Learning Preferences Written Material             Education Topics: How Lungs Work and Diseases: - Discuss the anatomy of the lungs and diseases that can affect the lungs, such as  COPD.   Exercise: -Discuss the importance of exercise, FITT principles of exercise, normal and abnormal responses to exercise, and how to exercise safely.   Environmental Irritants: -Discuss types of environmental irritants  and how to limit exposure to environmental irritants.   Meds/Inhalers and oxygen: - Discuss respiratory medications, definition of an inhaler and oxygen, and the proper way to use an inhaler and oxygen.   Energy Saving Techniques: - Discuss methods to conserve energy and decrease shortness of breath when performing activities of daily living.  Flowsheet Row PULMONARY REHAB OTHER RESPIRATORY from 09/25/2022 in [place] [facility]  Date 09/25/22  Educator HB  Instruction Review Code 1- Verbalizes Understanding       Bronchial Hygiene / Breathing Techniques: - Discuss breathing mechanics, pursed-lip breathing technique,  proper posture, effective ways to clear airways, and other functional breathing techniques   Cleaning Equipment: - Provides group verbal and written instruction about the health risks of elevated stress, cause of high stress, and healthy ways to reduce stress.   Nutrition I: Fats: - Discuss the types of cholesterol, what cholesterol does to the body, and how cholesterol levels can be controlled.   Nutrition II: Labels: -Discuss the different components of food labels and how to read food labels.   Respiratory Infections: - Discuss the signs and symptoms of respiratory infections, ways to prevent respiratory infections, and the importance of seeking medical treatment when having a respiratory infection.   Stress I: Signs and Symptoms: - Discuss the causes of stress, how stress may lead to anxiety and depression, and ways to limit stress.   Stress II: Relaxation: -Discuss relaxation techniques to limit stress.   Oxygen for Home/Travel: - Discuss how to prepare for travel when on oxygen and proper ways to transport and  store oxygen to ensure safety.   Knowledge Questionnaire Score:  Knowledge Questionnaire Score - 09/17/22 1347       Knowledge Questionnaire Score   Pre Score 16/18             Core Components/Risk Factors/Patient Goals at Admission:  Personal Goals and Risk Factors at Admission - 09/17/22 1348       Core Components/Risk Factors/Patient Goals on Admission    Weight Management Obesity;Weight Maintenance    Improve shortness of breath with ADL's Yes    Intervention Provide education, individualized exercise plan and daily activity instruction to help decrease symptoms of SOB with activities of daily living.    Expected Outcomes Short Term: Improve cardiorespiratory fitness to achieve a reduction of symptoms when performing ADLs;Long Term: Be able to perform more ADLs without symptoms or delay the onset of symptoms    Diabetes Yes    Intervention Provide education about signs/symptoms and action to take for hypo/hyperglycemia.;Provide education about proper nutrition, including hydration, and aerobic/resistive exercise prescription along with prescribed medications to achieve blood glucose in normal ranges: Fasting glucose 65-99 mg/dL    Expected Outcomes Short Term: Participant verbalizes understanding of the signs/symptoms and immediate care of hyper/hypoglycemia, proper foot care and importance of medication, aerobic/resistive exercise and nutrition plan for blood glucose control.;Long Term: Attainment of HbA1C < 7%.    Heart Failure Yes    Intervention Provide a combined exercise and nutrition program that is supplemented with education, support and counseling about heart failure. Directed toward relieving symptoms such as shortness of breath, decreased exercise tolerance, and extremity edema.    Expected Outcomes Short term: Attendance in program 2-3 days a week with increased exercise capacity. Reported lower sodium intake. Reported increased fruit and vegetable intake. Reports  medication compliance.    Personal Goal Other Yes    Personal Goal Patient wants to improve her stamina and to be able  to walk up stairs without SOB.    Intervention Patient will attend PR 2 days/week with exercise and education.    Expected Outcomes Patient will complete the program.             Core Components/Risk Factors/Patient Goals Review:   Goals and Risk Factor Review     Row Name 09/24/22 1424             Core Components/Risk Factors/Patient Goals Review   Personal Goals Review Other;Weight Management/Obesity;Diabetes       Review Pt is new to the program, and she was referred to PR due to dyspne on exertion.  Her current weight is 116.7 Kg.  She is exercising on RA with 02 sats from 95-97%.  Her goals are to improve her stamina and be able to walk up stairs.  We will continue to monitor her progress as she works toward meeting her goals.       Expected Outcomes Pt will complete the program and meet her personal goals as well as the program goals.                Core Components/Risk Factors/Patient Goals at Discharge (Final Review):   Goals and Risk Factor Review - 09/24/22 1424       Core Components/Risk Factors/Patient Goals Review   Personal Goals Review Other;Weight Management/Obesity;Diabetes    Review Pt is new to the program, and she was referred to PR due to dyspne on exertion.  Her current weight is 116.7 Kg.  She is exercising on RA with 02 sats from 95-97%.  Her goals are to improve her stamina and be able to walk up stairs.  We will continue to monitor her progress as she works toward meeting her goals.    Expected Outcomes Pt will complete the program and meet her personal goals as well as the program goals.             ITP Comments:   Comments: ITP REVIEW Pt is making expected progress toward pulmonary rehab goals after completing 4 sessions. Recommend continued exercise, life style modification, education, and utilization of breathing  techniques to increase stamina and strength and decrease shortness of breath with exertion.

## 2022-10-02 ENCOUNTER — Encounter (HOSPITAL_COMMUNITY)
Admission: RE | Admit: 2022-10-02 | Discharge: 2022-10-02 | Disposition: A | Discharge status: Home / Self Care | Payer: Medicare PPO | Source: Ambulatory Visit | Attending: Internal Medicine | Admitting: Internal Medicine

## 2022-10-02 DIAGNOSIS — R0609 Other forms of dyspnea: Secondary | ICD-10-CM | POA: Diagnosis present

## 2022-10-02 NOTE — Progress Notes (Signed)
Daily Session Note  Patient Details  Name: Julie Bean MRN: [id number] Date of Birth: 14-Sep-1950 Referring Provider:   Flowsheet Row CARDIAC REHAB PHASE II ORIENTATION from 09/17/2022 in [facility]  Referring Provider Dr. Guy Begin       Encounter Date: 10/02/2022  Check In:  Session Check In - 10/02/22 1330       Check-In   Supervising physician immediately available to respond to emergencies [facility] MD immediately available    Physician(s) Dr Dellia Cloud    Location [facility]    Staff Present Hoy Register MHA, MS, ACSM-CEP;Vencent Hauschild BSN, RN;Daphyne Hassell Done, RN, BSN    Virtual Visit No    Medication changes reported     No    Fall or balance concerns reported    Yes    Comments Patient reports poor balance and has a history of falls.    Tobacco Cessation No Change    Warm-up and Cool-down Performed as group-led instruction    Resistance Training Performed Yes    VAD Patient? No    PAD/SET Patient? No      Pain Assessment   Currently in Pain? Yes    Pain Score 4     Pain Location Shoulder    Pain Orientation Left    Pain Descriptors / Indicators Aching    Pain Type Chronic pain    Pain Onset More than a month ago    Pain Frequency Constant    Multiple Pain Sites No             Capillary Blood Glucose: No results found for this or any previous visit (from the past 24 hour(s)).    Social History   Tobacco Use  Smoking Status Never  Smokeless Tobacco Never  Tobacco Comments   Both parents smoked    Goals Met:  Proper associated with RPD/PD & O2 Sat Independence with exercise equipment Using PLB without cueing & demonstrates good technique Exercise tolerated well Queuing for purse lip breathing No report of concerns or symptoms today Strength training completed today  Goals Unmet:  Not Applicable  Comments: check out at 14:30   Dr. Kathie Dike is Medical Director for [facility] Pulmonary  Rehab.

## 2022-10-04 LAB — TSH+FREE T4
Free T4: 1.37 ng/dL (ref 0.82–1.77)
TSH: 8.08 u[IU]/mL — ABNORMAL HIGH (ref 0.450–4.500)

## 2022-10-07 ENCOUNTER — Encounter: Payer: Self-pay | Admitting: Internal Medicine

## 2022-10-07 ENCOUNTER — Ambulatory Visit (INDEPENDENT_AMBULATORY_CARE_PROVIDER_SITE_OTHER): Payer: Medicare PPO | Admitting: Internal Medicine

## 2022-10-07 ENCOUNTER — Encounter (HOSPITAL_COMMUNITY)
Admission: RE | Admit: 2022-10-07 | Discharge: 2022-10-07 | Disposition: A | Discharge status: Home / Self Care | Payer: Medicare PPO | Source: Ambulatory Visit | Attending: Internal Medicine | Admitting: Internal Medicine

## 2022-10-07 VITALS — BP 138/78 | HR 85 | Ht 68.0 in | Wt 257.6 lb

## 2022-10-07 DIAGNOSIS — E039 Hypothyroidism, unspecified: Secondary | ICD-10-CM

## 2022-10-07 DIAGNOSIS — R0609 Other forms of dyspnea: Secondary | ICD-10-CM

## 2022-10-07 NOTE — Patient Instructions (Addendum)
It was a pleasure to see you today.  Thank you for giving [place] the opportunity to be involved in your care.  Below is a brief recap of your visit and next steps.  We will plan to see you again in 4 months.  Summary No medication changes today Continue alternating doses of levothyroxine Follow up in 4 months Please return to care before this time if your blood sugar remains high
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
## 2022-10-07 NOTE — Assessment & Plan Note (Signed)
Levothyroxine was increased alternating doses of 150/175 mcg daily at her last appointment as she endorsed symptoms of hypothyroidism with decreasing to 150 mcg daily.  Today she states that her symptoms have largely resolved.  She is pleased with her current dose and does not desire to make any additional changes.  TSH has improved to 8.  Free T4 level remains WNL. -No medication changes today.  Continue current regimen. -We will tentatively plan for follow-up in 3 months

## 2022-10-07 NOTE — Progress Notes (Signed)
Daily Session Note  Patient Details  Name: Julie Bean MRN: [id number] Date of Birth: 1950/09/27 Referring Provider:   Flowsheet Row CARDIAC REHAB PHASE II ORIENTATION from 09/17/2022 in [place]  Referring Provider Dr. Guy Begin       Encounter Date: 10/07/2022  Check In:  Session Check In - 10/07/22 1330       Check-In   Supervising physician immediately available to respond to emergencies [facility] MD immediately available    Physician(s) Dr Harl Bowie    Location [facility]    Staff Present Avanell Banwart Hassell Done, RN, BSN;Heather Mel Almond, BS, Exercise Physiologist;Phyllis Billingsley, RN    Virtual Visit No    Medication changes reported     No    Fall or balance concerns reported    Yes    Comments Patient reports poor balance and has a history of falls.    Tobacco Cessation No Change    Warm-up and Cool-down Performed as group-led instruction    Resistance Training Performed Yes    VAD Patient? No    PAD/SET Patient? No      Pain Assessment   Currently in Pain? No/denies    Pain Score 0-No pain    Multiple Pain Sites No             Capillary Blood Glucose: No results found for this or any previous visit (from the past 24 hour(s)).    Social History   Tobacco Use  Smoking Status Never  Smokeless Tobacco Never  Tobacco Comments   Both parents smoked    Goals Met:  Proper associated with RPD/PD & O2 Sat Independence with exercise equipment Using PLB without cueing & demonstrates good technique Exercise tolerated well Queuing for purse lip breathing No report of concerns or symptoms today Strength training completed today  Goals Unmet:  Not Applicable  Comments: Checkout at 1430.   Dr. Kathie Dike is Medical Director for [facility] Pulmonary Rehab.

## 2022-10-09 ENCOUNTER — Encounter (HOSPITAL_COMMUNITY)
Admission: RE | Admit: 2022-10-09 | Discharge: 2022-10-09 | Disposition: A | Discharge status: Home / Self Care | Payer: Medicare PPO | Source: Ambulatory Visit | Attending: Internal Medicine | Admitting: Internal Medicine

## 2022-10-09 DIAGNOSIS — R0609 Other forms of dyspnea: Secondary | ICD-10-CM | POA: Diagnosis not present

## 2022-10-09 NOTE — Progress Notes (Signed)
Daily Session Note  Patient Details  Name: Julie Bean MRN: [id number] Date of Birth: [birth year] Referring Provider:   Flowsheet Row CARDIAC REHAB PHASE II ORIENTATION from 09/17/2022 in [place]  Referring Provider Dr. Guy Begin       Encounter Date: 10/09/2022  Check In:  Session Check In - 10/09/22 1339       Check-In   Supervising physician immediately available to respond to emergencies [facility] MD immediately available    Physician(s) Dr Harl Bowie    Location [facility]    Staff Present Kerrie Latour Hassell Done, RN, BSN;Heather Mel Almond, BS, Exercise Physiologist;Debra Wynetta Emery, RN, BSN    Virtual Visit No    Medication changes reported     No    Fall or balance concerns reported    Yes    Comments Patient reports poor balance and has a history of falls.    Tobacco Cessation No Change    Warm-up and Cool-down Performed as group-led instruction    Resistance Training Performed Yes    VAD Patient? No    PAD/SET Patient? No      Pain Assessment   Currently in Pain? No/denies    Pain Score 0-No pain    Multiple Pain Sites No             Capillary Blood Glucose: No results found for this or any previous visit (from the past 24 hour(s)).    Social History   Tobacco Use  Smoking Status Never  Smokeless Tobacco Never  Tobacco Comments   Both parents smoked    Goals Met:  Proper associated with RPD/PD & O2 Sat Independence with exercise equipment Using PLB without cueing & demonstrates good technique Exercise tolerated well Queuing for purse lip breathing No report of concerns or symptoms today Strength training completed today  Goals Unmet:  Not Applicable  Comments: checkout at 1430.   Dr. Kathie Dike is Medical Director for [facility] Pulmonary Rehab.

## 2022-10-14 ENCOUNTER — Encounter (HOSPITAL_COMMUNITY)
Admission: RE | Admit: 2022-10-14 | Discharge: 2022-10-14 | Disposition: A | Discharge status: Home / Self Care | Payer: Medicare PPO | Source: Ambulatory Visit | Attending: Internal Medicine | Admitting: Internal Medicine

## 2022-10-14 VITALS — Wt 259.3 lb

## 2022-10-14 DIAGNOSIS — R0609 Other forms of dyspnea: Secondary | ICD-10-CM | POA: Diagnosis not present

## 2022-10-14 NOTE — Progress Notes (Signed)
Daily Session Note  Patient Details  Name: Julie Bean MRN: [id number] Date of Birth: 1951/03/23 Referring Provider:   Flowsheet Row CARDIAC REHAB PHASE II ORIENTATION from 09/17/2022 in [place]  Referring Provider Dr. Guy Begin       Encounter Date: 10/14/2022  Check In:  Session Check In - 10/14/22 1330       Check-In   Supervising physician immediately available to respond to emergencies [facility] MD immediately available    Physician(s) Dr Domenic Polite    Location [facility]    Staff Present Hadiya Spoerl Hassell Done, RN, BSN;Heather Mel Almond, BS, Exercise Physiologist;Phyllis Billingsley, RN;Dalton Sherrie George, MS, ACSM-CEP    Virtual Visit No    Medication changes reported     No    Fall or balance concerns reported    Yes    Comments Patient reports poor balance and has a history of falls.    Tobacco Cessation No Change    Warm-up and Cool-down Performed as group-led instruction    Resistance Training Performed Yes    VAD Patient? No    PAD/SET Patient? No      Pain Assessment   Currently in Pain? No/denies    Pain Score 0-No pain    Multiple Pain Sites No             Capillary Blood Glucose: No results found for this or any previous visit (from the past 24 hour(s)).    Social History   Tobacco Use  Smoking Status Never  Smokeless Tobacco Never  Tobacco Comments   Both parents smoked    Goals Met:  Proper associated with RPD/PD & O2 Sat Independence with exercise equipment Using PLB without cueing & demonstrates good technique Exercise tolerated well Queuing for purse lip breathing No report of concerns or symptoms today Strength training completed today  Goals Unmet:  Not Applicable  Comments: Checkout at 1430.   Dr. Kathie Dike is Medical Director for [facility].

## 2022-10-16 ENCOUNTER — Encounter (HOSPITAL_COMMUNITY)
Admission: RE | Admit: 2022-10-16 | Discharge: 2022-10-16 | Disposition: A | Discharge status: Home / Self Care | Payer: Medicare PPO | Source: Ambulatory Visit | Attending: Internal Medicine | Admitting: Internal Medicine

## 2022-10-16 DIAGNOSIS — R0609 Other forms of dyspnea: Secondary | ICD-10-CM | POA: Diagnosis not present

## 2022-10-16 NOTE — Progress Notes (Signed)
Daily Session Note  Patient Details  Name: Julie Bean MRN: [id number] Date of Birth: Aug 21, 1951 Referring Provider:   Flowsheet Row CARDIAC REHAB PHASE II ORIENTATION from 09/17/2022 in [place]  Referring Provider Dr. Guy Begin       Encounter Date: 10/16/2022  Check In:  Session Check In - 10/16/22 1330       Check-In   Supervising physician immediately available to respond to emergencies [facility] MD immediately available    Physician(s) Dr Domenic Polite    Location [facility]    Staff Present Sherian Valenza Hassell Done, RN, BSN;Heather Mel Almond, BS, Exercise Physiologist;Debra Wynetta Emery, RN, BSN    Virtual Visit No    Medication changes reported     No    Fall or balance concerns reported    Yes    Comments Patient reports poor balance and has a history of falls.    Tobacco Cessation No Change    Warm-up and Cool-down Performed as group-led instruction    Resistance Training Performed Yes    VAD Patient? No    PAD/SET Patient? No      Pain Assessment   Currently in Pain? No/denies    Pain Score 0-No pain    Multiple Pain Sites No             Capillary Blood Glucose: No results found for this or any previous visit (from the past 24 hour(s)).    Social History   Tobacco Use  Smoking Status Never  Smokeless Tobacco Never  Tobacco Comments   Both parents smoked    Goals Met:  Proper associated with RPD/PD & O2 Sat Independence with exercise equipment Using PLB without cueing & demonstrates good technique Exercise tolerated well Queuing for purse lip breathing No report of concerns or symptoms today Strength training completed today  Goals Unmet:  Not Applicable  Comments: Checkout at 1430.   Dr. Kathie Dike is Medical Director for [facility].

## 2022-10-21 ENCOUNTER — Encounter (HOSPITAL_COMMUNITY)
Admission: RE | Admit: 2022-10-21 | Discharge: 2022-10-21 | Disposition: A | Discharge status: Home / Self Care | Payer: Medicare PPO | Source: Ambulatory Visit | Attending: Internal Medicine | Admitting: Internal Medicine

## 2022-10-21 DIAGNOSIS — R0609 Other forms of dyspnea: Secondary | ICD-10-CM | POA: Diagnosis not present

## 2022-10-21 NOTE — Progress Notes (Signed)
Daily Session Note  Patient Details  Name: Julie Bean MRN: [id number] Date of Birth: 1950/09/30 Referring Provider:   Flowsheet Row CARDIAC REHAB PHASE II ORIENTATION from 09/17/2022 in [place]  Referring Provider Dr. Guy Begin       Encounter Date: 10/21/2022  Check In:  Session Check In - 10/21/22 1330       Check-In   Supervising physician immediately available to respond to emergencies [facility] MD immediately available    Physician(s) Dr. Dellia Cloud    Location [facility]    Staff Present Leana Roe, BS, Exercise Physiologist;Dalton Sherrie George, MS, ACSM-CEP;Geanie Cooley, RN    Virtual Visit No    Medication changes reported     No    Fall or balance concerns reported    Yes    Comments Patient reports poor balance and has a history of falls.    Tobacco Cessation No Change    Warm-up and Cool-down Performed as group-led instruction    Resistance Training Performed Yes    VAD Patient? No    PAD/SET Patient? No      Pain Assessment   Currently in Pain? Yes    Pain Score 4     Pain Location Arm    Pain Orientation Right;Mid;Medial    Pain Descriptors / Indicators Aching;Dull    Pain Type Acute pain    Pain Onset In the past 7 days    Multiple Pain Sites No             Capillary Blood Glucose: No results found for this or any previous visit (from the past 24 hour(s)).    Social History   Tobacco Use  Smoking Status Never  Smokeless Tobacco Never  Tobacco Comments   Both parents smoked    Goals Met:  Proper associated with RPD/PD & O2 Sat Independence with exercise equipment Exercise tolerated well No report of concerns or symptoms today  Goals Unmet:  Not Applicable  Comments: check ou @ 2:30pm   Dr. Kathie Dike is Medical Director for [facility].

## 2022-10-23 ENCOUNTER — Encounter (HOSPITAL_COMMUNITY)
Admission: RE | Admit: 2022-10-23 | Discharge: 2022-10-23 | Disposition: A | Discharge status: Home / Self Care | Payer: Medicare PPO | Source: Ambulatory Visit | Attending: Internal Medicine | Admitting: Internal Medicine

## 2022-10-23 DIAGNOSIS — R0609 Other forms of dyspnea: Secondary | ICD-10-CM

## 2022-10-23 NOTE — Progress Notes (Signed)
Daily Session Note  Patient Details  Name: Julie Bean MRN: [id number] Date of Birth: 01-20-1951 Referring Provider:   Flowsheet Row CARDIAC REHAB PHASE II ORIENTATION from 09/17/2022 in [place]  Referring Provider Dr. Guy Begin       Encounter Date: 10/23/2022  Check In:  Session Check In - 10/23/22 1330       Check-In   Supervising physician immediately available to respond to emergencies [facility] MD immediately available    Physician(s) Dr. Dellia Cloud    Location [facility]    Staff Present Leana Roe, BS, Exercise Physiologist;Dalton Sherrie George, MS, ACSM-CEP;[employer] BSN, RN;Debra Wynetta Emery, RN, BSN    Virtual Visit No    Medication changes reported     No    Fall or balance concerns reported    Yes    Comments Patient reports poor balance and has a history of falls.    Tobacco Cessation No Change    Warm-up and Cool-down Performed as group-led instruction    Resistance Training Performed Yes    VAD Patient? No    PAD/SET Patient? No      Pain Assessment   Currently in Pain? Yes    Pain Score 5     Pain Location Arm    Pain Orientation Right    Pain Descriptors / Indicators Aching;Dull    Pain Type Acute pain    Pain Onset In the past 7 days    Pain Frequency Constant    Multiple Pain Sites No             Capillary Blood Glucose: No results found for this or any previous visit (from the past 24 hour(s)).    Social History   Tobacco Use  Smoking Status Never  Smokeless Tobacco Never  Tobacco Comments   Both parents smoked    Goals Met:  Proper associated with RPD/PD & O2 Sat Independence with exercise equipment Using PLB without cueing & demonstrates good technique Exercise tolerated well Queuing for purse lip breathing No report of concerns or symptoms today Strength training completed today  Goals Unmet:  Not Applicable  Comments: check out at 14:30   Dr. Kathie Dike is  Medical Director for [facility].

## 2022-10-28 ENCOUNTER — Encounter (HOSPITAL_COMMUNITY): Payer: Medicare PPO

## 2022-10-29 ENCOUNTER — Ambulatory Visit (HOSPITAL_COMMUNITY)
Admission: RE | Admit: 2022-10-29 | Discharge: 2022-10-29 | Disposition: A | Discharge status: Home / Self Care | Payer: Medicare PPO | Source: Ambulatory Visit | Attending: Internal Medicine | Admitting: Internal Medicine

## 2022-10-29 ENCOUNTER — Encounter: Payer: Self-pay | Admitting: Internal Medicine

## 2022-10-29 ENCOUNTER — Ambulatory Visit (INDEPENDENT_AMBULATORY_CARE_PROVIDER_SITE_OTHER): Payer: Medicare PPO | Admitting: Internal Medicine

## 2022-10-29 VITALS — BP 112/70 | HR 83 | Ht 68.0 in | Wt 258.0 lb

## 2022-10-29 DIAGNOSIS — M25511 Pain in right shoulder: Secondary | ICD-10-CM | POA: Insufficient documentation

## 2022-10-29 NOTE — Progress Notes (Signed)
Pulmonary Individual Treatment Plan  Patient Details  Name: Julie Bean MRN: [id number] Date of Birth: May 07, 1951 Referring Provider:   Flowsheet Row CARDIAC REHAB PHASE II ORIENTATION from 09/17/2022 in [facility]  Referring Provider Dr. Guy Begin       Initial Encounter Date:  Flowsheet Row CARDIAC REHAB PHASE II ORIENTATION from 09/17/2022 in [facility]  Date 09/17/22       Visit Diagnosis: DOE (dyspnea on exertion)  Patient's Home Medications on Admission:   Current Outpatient Medications:    acetaminophen (TYLENOL) 500 MG tablet, Take 1,000 mg by mouth every 8 (eight) hours as needed for moderate pain., Disp: , Rfl:    amLODipine (NORVASC) 5 MG tablet, Take 1 tablet (5 mg total) by mouth daily., Disp: 90 tablet, Rfl: 3   dapagliflozin propanediol (FARXIGA) 10 MG TABS tablet, Take 1 tablet (10 mg total) by mouth daily before breakfast., Disp: 90 tablet, Rfl: 3   diclofenac Sodium (VOLTAREN) 1 % GEL, SMARTSIG:2 Gram(s) Topical 3 Times Daily PRN, Disp: , Rfl:    divalproex (DEPAKOTE) 500 MG DR tablet, Take 500 mg by mouth 3 (three) times daily., Disp: , Rfl:    glipiZIDE (GLUCOTROL XL) 10 MG 24 hr tablet, TAKE ONE TABLET (10 MG TOTAL) BY MOUTH DAILY WITH BREAKFAST., Disp: 90 tablet, Rfl: 0   glucose blood (CONTOUR TEST) test strip, Use as instructed once daily testing dx e11.9, Disp: 100 each, Rfl: 5   indomethacin (INDOCIN) 50 MG capsule, Take 1 capsule (50 mg total) by mouth 2 (two) times daily with a meal., Disp: 180 capsule, Rfl: 3   levothyroxine (SYNTHROID) 150 MCG tablet, Take 1 tablet (150 mcg total) by mouth every other day., Disp: 15 tablet, Rfl: 2   levothyroxine (SYNTHROID) 175 MCG tablet, Take 1 tablet (175 mcg total) by mouth every other day., Disp: 15 tablet, Rfl: 2   metFORMIN (GLUCOPHAGE) 500 MG tablet, Take 1 tablet (500 mg total) by mouth 3 (three) times daily., Disp: 270 tablet, Rfl: 3   metoprolol succinate  (TOPROL-XL) 25 MG 24 hr tablet, Take 1 tablet (25 mg total) by mouth daily., Disp: 90 tablet, Rfl: 3   Multiple Vitamins-Minerals (PRESERVISION AREDS 2+MULTI VIT PO), Take 1 tablet by mouth 2 (two) times daily., Disp: , Rfl:    omeprazole (PRILOSEC) 40 MG capsule, Take 1 capsule (40 mg total) by mouth daily., Disp: 90 capsule, Rfl: 3   rosuvastatin (CRESTOR) 10 MG tablet, Take 1 tablet (10 mg total) by mouth daily., Disp: 90 tablet, Rfl: 3   torsemide (DEMADEX) 20 MG tablet, TAKE TWO TABLETS ('40MG'$  TOTAL) BY MOUTH DAILY, Disp: 180 tablet, Rfl: 2  Past Medical History: Past Medical History:  Diagnosis Date   Arthritis    knees, shoulder, neck and hip   Cataract    [employer] 09/18/2020   CHF (congestive heart failure) (HCC)    Chronic kidney disease    Closed fracture of left proximal humerus 04/07/2018   Diabetes mellitus without complication (HCC)    Type II   DISH (diffuse idiopathic skeletal hyperostosis)    Encounter for well adult exam with abnormal findings 08/10/2022   GERD (gastroesophageal reflux disease)    Headache, hemicrania continua    Hyperlipidemia    [employer] 09/18/2020   Hypertension    Hypothyroidism    Macular degeneration    Macular degeneration    followed by Dr. Manuella Ghazi in [place]   PONV (postoperative nausea and vomiting)    Proximal humerus fracture  Left   Thyroid disease    [employer] 09/18/2020    Tobacco Use: Social History   Tobacco Use  Smoking Status Never  Smokeless Tobacco Never  Tobacco Comments   Both parents smoked    Labs: Review Flowsheet  More data exists      Latest Ref Rng & Units 03/05/2021 06/04/2021 03/12/2022 07/08/2022 07/31/2022  Labs for ITP Cardiac and Pulmonary Rehab  Cholestrol 100 - 199 mg/dL 163  162  168  - 184   LDL (calc) 0 - 99 mg/dL 76  76  61  - 68   HDL-C >39 mg/dL 57  45  49  - 42   Trlycerides 0 - 149 mg/dL 178  252  379  - 481   Hemoglobin A1c 4.8 - 5.6 % 6.3  6.8  8.6  8.6  8.8     Capillary Blood  Glucose: Lab Results  Component Value Date   GLUCAP 208 (H) 09/30/2022   GLUCAP 243 (H) 09/25/2022   GLUCAP 239 (H) 09/23/2022   GLUCAP 228 (H) 09/17/2022   GLUCAP 163 (H) 06/28/2021     Pulmonary Assessment Scores:  Pulmonary Assessment Scores     Row Name 09/17/22 1351         ADL UCSD   ADL Phase Entry     SOB Score total 17     Rest 0     Walk 0     Stairs 4     Bath 0     Dress 0     Shop 1       CAT Score   CAT Score 6       mMRC Score   mMRC Score 2             UCSD: Self-administered rating of dyspnea associated with activities of daily living (ADLs) 6-point scale (0 = "not at all" to 5 = "maximal or unable to do because of breathlessness")  Scoring Scores range from 0 to 120.  Minimally important difference is 5 units  CAT: CAT can identify the health impairment of COPD patients and is better correlated with disease progression.  CAT has a scoring range of zero to 40. The CAT score is classified into four groups of low (less than 10), medium (10 - 20), high (21-30) and very high (31-40) based on the impact level of disease on health status. A CAT score over 10 suggests significant symptoms.  A worsening CAT score could be explained by an exacerbation, poor medication adherence, poor inhaler technique, or progression of COPD or comorbid conditions.  CAT MCID is 2 points  mMRC: mMRC (Modified Medical Research Council) Dyspnea Scale is used to assess the degree of baseline functional disability in patients of respiratory disease due to dyspnea. No minimal important difference is established. A decrease in score of 1 point or greater is considered a positive change.   Pulmonary Function Assessment:   Exercise Target Goals: Exercise Program Goal: Individual exercise prescription set using results from initial 6 min walk test and THRR while considering  patient's activity barriers and safety.   Exercise Prescription Goal: Initial exercise prescription  builds to 30-45 minutes a day of aerobic activity, 2-3 days per week.  Home exercise guidelines will be given to patient during program as part of exercise prescription that the participant will acknowledge.  Activity Barriers & Risk Stratification:  Activity Barriers & Cardiac Risk Stratification - 09/17/22 1303       Activity Barriers & Cardiac  Risk Stratification   Activity Barriers Back Problems;Neck/Spine Problems;Joint Problems;Shortness of Breath;Balance Concerns;History of Falls    Cardiac Risk Stratification High             6 Minute Walk:  6 Minute Walk     Row Name 09/17/22 1406         6 Minute Walk   Phase Initial     Distance 800 feet     Walk Time 6 minutes     # of Rest Breaks 0     MPH 1.51     METS 1.38     RPE 13     Perceived Dyspnea  11     VO2 Peak 4.85     Symptoms No     Comments none     Resting HR 80 bpm     Resting BP 100/56     Resting Oxygen Saturation  96 %     Exercise Oxygen Saturation  during 6 min walk 97 %     Max Ex. HR 115 bpm     Max Ex. BP 120/60     2 Minute Post BP 110/58       Interval HR   1 Minute HR 104     2 Minute HR 107     3 Minute HR 110     4 Minute HR 112     5 Minute HR 114     6 Minute HR 115     2 Minute Post HR 81     Interval Heart Rate? Yes       Interval Oxygen   Interval Oxygen? Yes     Baseline Oxygen Saturation % 96 %     1 Minute Oxygen Saturation % 97 %     1 Minute Liters of Oxygen 0 L     2 Minute Oxygen Saturation % 97 %     2 Minute Liters of Oxygen 0 L     3 Minute Oxygen Saturation % 97 %     3 Minute Liters of Oxygen 0 L     4 Minute Oxygen Saturation % 97 %     4 Minute Liters of Oxygen 0 L     5 Minute Oxygen Saturation % 98 %     5 Minute Liters of Oxygen 0 L     6 Minute Oxygen Saturation % 98 %     6 Minute Liters of Oxygen 0 L     2 Minute Post Oxygen Saturation % 98 %     2 Minute Post Liters of Oxygen 0 L              Oxygen Initial Assessment:  Oxygen Initial  Assessment - 09/17/22 1351       Home Oxygen   Home Oxygen Device None    Sleep Oxygen Prescription None    Home Exercise Oxygen Prescription None    Home Resting Oxygen Prescription None      Initial 6 min Walk   Oxygen Used None      Program Oxygen Prescription   Program Oxygen Prescription None      Intervention   Short Term Goals To learn and understand importance of monitoring SPO2 with pulse oximeter and demonstrate accurate use of the pulse oximeter.;To learn and understand importance of maintaining oxygen saturations>88%;To learn and demonstrate proper pursed lip breathing techniques or other breathing techniques.     Long  Term Goals Verbalizes importance of monitoring SPO2  with pulse oximeter and return demonstration;Maintenance of O2 saturations>88%;Exhibits proper breathing techniques, such as pursed lip breathing or other method taught during program session             Oxygen Re-Evaluation:  Oxygen Re-Evaluation     Row Name 09/30/22 1552 10/28/22 1551           Program Oxygen Prescription   Program Oxygen Prescription None None        Home Oxygen   Home Oxygen Device None None      Sleep Oxygen Prescription None None      Home Exercise Oxygen Prescription None None      Home Resting Oxygen Prescription None None      Compliance with Home Oxygen Use Yes Yes        Goals/Expected Outcomes   Short Term Goals To learn and understand importance of monitoring SPO2 with pulse oximeter and demonstrate accurate use of the pulse oximeter.;To learn and understand importance of maintaining oxygen saturations>88%;To learn and demonstrate proper pursed lip breathing techniques or other breathing techniques.  To learn and understand importance of monitoring SPO2 with pulse oximeter and demonstrate accurate use of the pulse oximeter.;To learn and understand importance of maintaining oxygen saturations>88%;To learn and demonstrate proper pursed lip breathing techniques or  other breathing techniques.       Long  Term Goals Verbalizes importance of monitoring SPO2 with pulse oximeter and return demonstration;Maintenance of O2 saturations>88%;Exhibits proper breathing techniques, such as pursed lip breathing or other method taught during program session Verbalizes importance of monitoring SPO2 with pulse oximeter and return demonstration;Maintenance of O2 saturations>88%;Exhibits proper breathing techniques, such as pursed lip breathing or other method taught during program session      Goals/Expected Outcomes compliance compliance               Oxygen Discharge (Final Oxygen Re-Evaluation):  Oxygen Re-Evaluation - 10/28/22 1551       Program Oxygen Prescription   Program Oxygen Prescription None      Home Oxygen   Home Oxygen Device None    Sleep Oxygen Prescription None    Home Exercise Oxygen Prescription None    Home Resting Oxygen Prescription None    Compliance with Home Oxygen Use Yes      Goals/Expected Outcomes   Short Term Goals To learn and understand importance of monitoring SPO2 with pulse oximeter and demonstrate accurate use of the pulse oximeter.;To learn and understand importance of maintaining oxygen saturations>88%;To learn and demonstrate proper pursed lip breathing techniques or other breathing techniques.     Long  Term Goals Verbalizes importance of monitoring SPO2 with pulse oximeter and return demonstration;Maintenance of O2 saturations>88%;Exhibits proper breathing techniques, such as pursed lip breathing or other method taught during program session    Goals/Expected Outcomes compliance             Initial Exercise Prescription:  Initial Exercise Prescription - 09/17/22 1400       Date of Initial Exercise RX and Referring Provider   Date 09/17/22    Referring Provider Dr. Guy Begin    Expected Discharge Date 01/20/23      NuStep   Level 1    SPM 60    Minutes 22      Arm Ergometer   Level 1    RPM 40     Minutes 17      Prescription Details   Frequency (times per week) 2    Duration Progress to 30 minutes of  continuous aerobic without signs/symptoms of physical distress      Intensity   THRR 40-80% of Max Heartrate 60-120    Ratings of Perceived Exertion 11-13    Perceived Dyspnea 0-4      Resistance Training   Training Prescription Yes    Weight 3    Reps 10-15             Perform Capillary Blood Glucose checks as needed.  Exercise Prescription Changes:   Exercise Prescription Changes     Row Name 09/30/22 1500 10/14/22 1500 10/23/22 1500         Response to Exercise   Blood Pressure (Admit) 114/66 130/64 114/68     Blood Pressure (Exercise) 126/68 128/62 126/70     Blood Pressure (Exit) 120/62 108/56 116/60     Heart Rate (Admit) 82 bpm 77 bpm 83 bpm     Heart Rate (Exercise) 85 bpm 88 bpm 86 bpm     Heart Rate (Exit) 85 bpm 81 bpm 86 bpm     Oxygen Saturation (Admit) 96 % 97 % 96 %     Oxygen Saturation (Exercise) 97 % 96 % 96 %     Oxygen Saturation (Exit) 95 % 97 % 98 %     Rating of Perceived Exertion (Exercise) '12 12 12     '$ Perceived Dyspnea (Exercise) '12 12 12     '$ Duration Continue with 30 min of aerobic exercise without signs/symptoms of physical distress. Continue with 30 min of aerobic exercise without signs/symptoms of physical distress. Continue with 30 min of aerobic exercise without signs/symptoms of physical distress.     Intensity THRR unchanged THRR unchanged THRR unchanged       Progression   Progression Continue to progress workloads to maintain intensity without signs/symptoms of physical distress. Continue to progress workloads to maintain intensity without signs/symptoms of physical distress. Continue to progress workloads to maintain intensity without signs/symptoms of physical distress.       Resistance Training   Training Prescription Yes Yes Yes     Weight '2 3 3     '$ Reps 10-15 10-15 10-15     Time 10 Minutes 10 Minutes 10 Minutes        NuStep   Level '1 3 3     '$ SPM 69 77 92     Minutes 17 17 39     METs 1.7 1.8 1.8       Arm Ergometer   Level 1 1 --     RPM 21 26 --     Minutes 17 22 --     METs 1.4 1.3 --              Exercise Comments:   Exercise Goals and Review:   Exercise Goals     Row Name 09/17/22 1410 09/30/22 1554 10/28/22 1549         Exercise Goals   Increase Physical Activity Yes Yes Yes     Intervention Provide advice, education, support and counseling about physical activity/exercise needs.;Develop an individualized exercise prescription for aerobic and resistive training based on initial evaluation findings, risk stratification, comorbidities and participant's personal goals. Provide advice, education, support and counseling about physical activity/exercise needs.;Develop an individualized exercise prescription for aerobic and resistive training based on initial evaluation findings, risk stratification, comorbidities and participant's personal goals. Provide advice, education, support and counseling about physical activity/exercise needs.;Develop an individualized exercise prescription for aerobic and resistive training based on initial evaluation findings, risk stratification,  comorbidities and participant's personal goals.     Expected Outcomes Short Term: Attend rehab on a regular basis to increase amount of physical activity.;Long Term: Add in home exercise to make exercise part of routine and to increase amount of physical activity.;Long Term: Exercising regularly at least 3-5 days a week. Short Term: Attend rehab on a regular basis to increase amount of physical activity.;Long Term: Add in home exercise to make exercise part of routine and to increase amount of physical activity.;Long Term: Exercising regularly at least 3-5 days a week. Short Term: Attend rehab on a regular basis to increase amount of physical activity.;Long Term: Add in home exercise to make exercise part of routine and to  increase amount of physical activity.;Long Term: Exercising regularly at least 3-5 days a week.     Increase Strength and Stamina Yes Yes Yes     Intervention Provide advice, education, support and counseling about physical activity/exercise needs.;Develop an individualized exercise prescription for aerobic and resistive training based on initial evaluation findings, risk stratification, comorbidities and participant's personal goals. Provide advice, education, support and counseling about physical activity/exercise needs.;Develop an individualized exercise prescription for aerobic and resistive training based on initial evaluation findings, risk stratification, comorbidities and participant's personal goals. Provide advice, education, support and counseling about physical activity/exercise needs.;Develop an individualized exercise prescription for aerobic and resistive training based on initial evaluation findings, risk stratification, comorbidities and participant's personal goals.     Expected Outcomes Short Term: Increase workloads from initial exercise prescription for resistance, speed, and METs.;Short Term: Perform resistance training exercises routinely during rehab and add in resistance training at home;Long Term: Improve cardiorespiratory fitness, muscular endurance and strength as measured by increased METs and functional capacity (6MWT) Short Term: Increase workloads from initial exercise prescription for resistance, speed, and METs.;Short Term: Perform resistance training exercises routinely during rehab and add in resistance training at home;Long Term: Improve cardiorespiratory fitness, muscular endurance and strength as measured by increased METs and functional capacity (6MWT) Short Term: Increase workloads from initial exercise prescription for resistance, speed, and METs.;Short Term: Perform resistance training exercises routinely during rehab and add in resistance training at home;Long Term:  Improve cardiorespiratory fitness, muscular endurance and strength as measured by increased METs and functional capacity (6MWT)     Able to understand and use rate of perceived exertion (RPE) scale Yes Yes Yes     Intervention Provide education and explanation on how to use RPE scale Provide education and explanation on how to use RPE scale Provide education and explanation on how to use RPE scale     Expected Outcomes Short Term: Able to use RPE daily in rehab to express subjective intensity level;Long Term:  Able to use RPE to guide intensity level when exercising independently Short Term: Able to use RPE daily in rehab to express subjective intensity level;Long Term:  Able to use RPE to guide intensity level when exercising independently Short Term: Able to use RPE daily in rehab to express subjective intensity level;Long Term:  Able to use RPE to guide intensity level when exercising independently     Knowledge and understanding of Target Heart Rate Range (THRR) Yes Yes Yes     Intervention Provide education and explanation of THRR including how the numbers were predicted and where they are located for reference Provide education and explanation of THRR including how the numbers were predicted and where they are located for reference Provide education and explanation of THRR including how the numbers were predicted and where they  are located for reference     Expected Outcomes Short Term: Able to state/look up THRR;Long Term: Able to use THRR to govern intensity when exercising independently;Short Term: Able to use daily as guideline for intensity in rehab Short Term: Able to state/look up THRR;Long Term: Able to use THRR to govern intensity when exercising independently;Short Term: Able to use daily as guideline for intensity in rehab Short Term: Able to state/look up THRR;Long Term: Able to use THRR to govern intensity when exercising independently;Short Term: Able to use daily as guideline for intensity  in rehab     Able to check pulse independently Yes Yes Yes     Intervention Provide education and demonstration on how to check pulse in carotid and radial arteries.;Review the importance of being able to check your own pulse for safety during independent exercise Provide education and demonstration on how to check pulse in carotid and radial arteries.;Review the importance of being able to check your own pulse for safety during independent exercise Provide education and demonstration on how to check pulse in carotid and radial arteries.;Review the importance of being able to check your own pulse for safety during independent exercise     Expected Outcomes Short Term: Able to explain why pulse checking is important during independent exercise;Long Term: Able to check pulse independently and accurately Short Term: Able to explain why pulse checking is important during independent exercise;Long Term: Able to check pulse independently and accurately Short Term: Able to explain why pulse checking is important during independent exercise;Long Term: Able to check pulse independently and accurately     Understanding of Exercise Prescription Yes Yes Yes     Intervention Provide education, explanation, and written materials on patient's individual exercise prescription Provide education, explanation, and written materials on patient's individual exercise prescription Provide education, explanation, and written materials on patient's individual exercise prescription     Expected Outcomes Short Term: Able to explain program exercise prescription;Long Term: Able to explain home exercise prescription to exercise independently Short Term: Able to explain program exercise prescription;Long Term: Able to explain home exercise prescription to exercise independently Short Term: Able to explain program exercise prescription;Long Term: Able to explain home exercise prescription to exercise independently               Exercise Goals Re-Evaluation :  Exercise Goals Re-Evaluation     [place] Name 09/30/22 1555 10/28/22 1549           Exercise Goal Re-Evaluation   Exercise Goals Review Increase Physical Activity;Increase Strength and Stamina;Able to understand and use rate of perceived exertion (RPE) scale;Able to understand and use Dyspnea scale;Knowledge and understanding of Target Heart Rate Range (THRR);Understanding of Exercise Prescription Increase Physical Activity;Increase Strength and Stamina;Able to understand and use rate of perceived exertion (RPE) scale;Knowledge and understanding of Target Heart Rate Range (THRR);Able to check pulse independently;Understanding of Exercise Prescription      Comments Pt has completed 4 sessions of PR. She is motivated to progress in the program. She is deconditioned, but she should be able to progress well. She is currently exercising at 1.7 METs on the stepper. Will continue to monitor and progress as able. Pt has completed 10 sessions of PR. She is motivated top be in the program and is progressing. SHe recently hurt her R bicep area and has been using the stepper for both sessions during class. She is currently exerciisng at 1.8 METs on the stepper. Will continue to monitor and progress as able,  Expected Outcomes Through exercise at rehab and at home, the patient will meet their stated goals. Through exercise at rehab and at home, the patient will meet their stated goals.               Discharge Exercise Prescription (Final Exercise Prescription Changes):  Exercise Prescription Changes - 10/23/22 1500       Response to Exercise   Blood Pressure (Admit) 114/68    Blood Pressure (Exercise) 126/70    Blood Pressure (Exit) 116/60    Heart Rate (Admit) 83 bpm    Heart Rate (Exercise) 86 bpm    Heart Rate (Exit) 86 bpm    Oxygen Saturation (Admit) 96 %    Oxygen Saturation (Exercise) 96 %    Oxygen Saturation (Exit) 98 %    Rating of Perceived  Exertion (Exercise) 12    Perceived Dyspnea (Exercise) 12    Duration Continue with 30 min of aerobic exercise without signs/symptoms of physical distress.    Intensity THRR unchanged      Progression   Progression Continue to progress workloads to maintain intensity without signs/symptoms of physical distress.      Resistance Training   Training Prescription Yes    Weight 3    Reps 10-15    Time 10 Minutes      NuStep   Level 3    SPM 92    Minutes 39    METs 1.8             Nutrition:  Target Goals: Understanding of nutrition guidelines, daily intake of sodium '1500mg'$ , cholesterol '200mg'$ , calories 30% from fat and 7% or less from saturated fats, daily to have 5 or more servings of fruits and vegetables.  Biometrics:  Pre Biometrics - 09/17/22 1410       Pre Biometrics   Height '5\' 8"'$  (1.727 m)    Weight 117.6 kg    Waist Circumference 52 inches    Hip Circumference 48 inches    Waist to Hip Ratio 1.08 %    BMI (Calculated) 39.43    Triceps Skinfold 40 mm    % Body Fat 52.6 %    Grip Strength 15 kg    Flexibility 0 in    Single Leg Stand 0 seconds              Nutrition Therapy Plan and Nutrition Goals:  Nutrition Therapy & Goals - 09/17/22 1344       Personal Nutrition Goals   Additional Goals? No    Comments Patient scored 36 on her diet assessment. She says she follows a low carb portion control diet similiar to the exchange DM diet. Handouts provided and explained regarding healthier choices and DM information. We offer 2 educational sessions on heart healthy nutrition with handouts and assistance with RD referral if patient is interested.      Intervention Plan   Intervention Nutrition handout(s) given to patient.    Expected Outcomes Short Term Goal: Understand basic principles of dietary content, such as calories, fat, sodium, cholesterol and nutrients.             Nutrition Assessments:  Nutrition Assessments - 09/17/22 1344        MEDFICTS Scores   Pre Score 36            MEDIFICTS Score Key: ?70 Need to make dietary changes  40-70 Heart Healthy Diet ? 40 Therapeutic Level Cholesterol Diet   Picture Your Plate Scores: [id number] Unhealthy dietary  pattern with much room for improvement. 41-50 Dietary pattern unlikely to meet recommendations for good health and room for improvement. 51-60 More healthful dietary pattern, with some room for improvement.  >60 Healthy dietary pattern, although there may be some specific behaviors that could be improved.    Nutrition Goals Re-Evaluation:   Nutrition Goals Discharge (Final Nutrition Goals Re-Evaluation):   Psychosocial: Target Goals: Acknowledge presence or absence of significant depression and/or stress, maximize coping skills, provide positive support system. Participant is able to verbalize types and ability to use techniques and skills needed for reducing stress and depression.  Initial Review & Psychosocial Screening:  Initial Psych Review & Screening - 09/17/22 1352       Initial Review   Current issues with None Identified      Family Dynamics   Good Support System? Yes      Barriers   Psychosocial barriers to participate in program There are no identifiable barriers or psychosocial needs.      Screening Interventions   Interventions Encouraged to exercise;To provide support and resources with identified psychosocial needs    Expected Outcomes Short Term goal: Identification and review with participant of any Quality of Life or Depression concerns found by scoring the questionnaire.             Quality of Life Scores:  Quality of Life - 09/17/22 1413       Quality of Life   Select Quality of Life      Quality of Life Scores   Health/Function Pre 21.75 %    Socioeconomic Pre 26.43 %    Psych/Spiritual Pre 25.93 %    Family Pre 27.17 %    GLOBAL Pre 24.12 %            Scores of 19 and below usually indicate a poorer quality of life  in these areas.  A difference of  2-3 points is a clinically meaningful difference.  A difference of 2-3 points in the total score of the Quality of Life Index has been associated with significant improvement in overall quality of life, self-image, physical symptoms, and general health in studies assessing change in quality of life.   PHQ-9: Review Flowsheet  More data exists      10/29/2022 10/07/2022 09/17/2022 09/09/2022 08/05/2022  Depression screen PHQ 2/9  Decreased Interest 0 0 0 0 0  Down, Depressed, Hopeless 0 0 0 0 0  PHQ - 2 Score 0 0 0 0 0  Altered sleeping 1 1 0 - 1  Tired, decreased energy 0 0 0 - 1  Change in appetite 1 1 0 - 1  Feeling bad or failure about yourself  0 0 0 - 0  Trouble concentrating 0 0 0 - 0  Moving slowly or fidgety/restless 0 0 0 - 0  Suicidal thoughts 0 0 0 - 0  PHQ-9 Score 2 2 0 - 3  Difficult doing work/chores - - Not difficult at all - -   Interpretation of Total Score  Total Score Depression Severity:  1-4 = Minimal depression, 5-9 = Mild depression, 10-14 = Moderate depression, 15-19 = Moderately severe depression, 20-27 = Severe depression   Psychosocial Evaluation and Intervention:  Psychosocial Evaluation - 09/17/22 1353       Psychosocial Evaluation & Interventions   Interventions Stress management education;Relaxation education;Encouraged to exercise with the program and follow exercise prescription    Comments Patient has no psychosocial barriers identified at her orientation visit. Her PHQ-9 score was 0.  She is a retired [employer]  serving for over 30 years. 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 She is originally from [place] so she moved back to [place] when she retired. She and her sister brought a house together after her sister's husband died. She says she has a good support system with her sister and her neices and  nephews and chruch family. She denies any depression or anxiety. She is a very pleasant 72 year old looking forward to participating in the program. She has stairs in her house and she wants to be able to climb them without SOB and improve her stamina.    Expected Outcomes Patient will continue to have no psychosocial barriers identified.    Continue Psychosocial Services  No Follow up required             Psychosocial Re-Evaluation:  Psychosocial Re-Evaluation     [place] Name 09/24/22 1421 10/20/22 1114           Psychosocial Re-Evaluation   Current issues with None Identified None Identified      Comments -- Patient has completed 8 sessions.   She seems to enjoy coming to class and  is very quiet, not very talkative.  She continues to have no psychosocial issues identified.  Patient continues to demonstrate a positvie outlook.   We will encourage more interaction with staff and class.   We will continue to monitor as she progresses in the program.      Expected Outcomes Patient is new to the program, and she has only completed 1 session.  She has no psychosocial barriers.  We will continue to monitor her progress. Patient will have no psychosocial issuses identified at discharge.      Interventions Stress management education;Relaxation education;Encouraged to attend Pulmonary Rehabilitation for the exercise Stress management education;Relaxation education;Encouraged to attend Pulmonary Rehabilitation for the exercise      Continue Psychosocial Services  No Follow up required No Follow up required               Psychosocial Discharge (Final Psychosocial Re-Evaluation):  Psychosocial Re-Evaluation - 10/20/22 1114       Psychosocial Re-Evaluation   Current issues with None Identified    Comments Patient has completed 8 sessions.   She seems to enjoy coming to class and  is very quiet, not very talkative.  She continues to have no psychosocial issues identified.  Patient continues to  demonstrate a positvie outlook.   We will encourage more interaction with staff and class.   We will continue to monitor as she progresses in the program.    Expected Outcomes Patient will have no psychosocial issuses identified at discharge.    Interventions Stress management education;Relaxation education;Encouraged to attend Pulmonary Rehabilitation for the exercise    Continue Psychosocial Services  No Follow up required              Education: Education Goals: Education classes will be provided on a weekly basis, covering required topics. Participant will state understanding/return demonstration of topics presented.  Learning Barriers/Preferences:  Learning Barriers/Preferences - 09/17/22 1347       Learning Barriers/Preferences   Learning Barriers None    Learning Preferences Written Material             Education Topics: How Lungs Work and Diseases: - Discuss the anatomy of the lungs and diseases  that can affect the lungs, such as COPD.   Exercise: -Discuss the importance of exercise, FITT principles of exercise, normal and abnormal responses to exercise, and how to exercise safely.   Environmental Irritants: -Discuss types of environmental irritants and how to limit exposure to environmental irritants.   Meds/Inhalers and oxygen: - Discuss respiratory medications, definition of an inhaler and oxygen, and the proper way to use an inhaler and oxygen.   Energy Saving Techniques: - Discuss methods to conserve energy and decrease shortness of breath when performing activities of daily living.  Flowsheet Row PULMONARY REHAB OTHER RESPIRATORY from 10/23/2022 in [facility]  Date 09/25/22  Educator HB  Instruction Review Code 1- Verbalizes Understanding       Bronchial Hygiene / Breathing Techniques: - Discuss breathing mechanics, pursed-lip breathing technique,  proper posture, effective ways to clear airways, and other functional  breathing techniques Flowsheet Row PULMONARY REHAB OTHER RESPIRATORY from 10/23/2022 in [facility]  Date 10/02/22  Educator DF  Instruction Review Code 1- Verbalizes Understanding       Cleaning Equipment: - Provides group verbal and written instruction about the health risks of elevated stress, cause of high stress, and healthy ways to reduce stress. Flowsheet Row PULMONARY REHAB OTHER RESPIRATORY from 10/23/2022 in [facility]  Date 10/09/22  Educator HB  Instruction Review Code 1- Verbalizes Understanding       Nutrition I: Fats: - Discuss the types of cholesterol, what cholesterol does to the body, and how cholesterol levels can be controlled. Flowsheet Row PULMONARY REHAB OTHER RESPIRATORY from 10/23/2022 in [facility]  Date 10/16/22  Educator handout       Nutrition II: Labels: -Discuss the different components of food labels and how to read food labels. Flowsheet Row PULMONARY REHAB OTHER RESPIRATORY from 10/23/2022 in [facility]  Date 10/23/22  Educator DF  Instruction Review Code 1- Verbalizes Understanding       Respiratory Infections: - Discuss the signs and symptoms of respiratory infections, ways to prevent respiratory infections, and the importance of seeking medical treatment when having a respiratory infection.   Stress I: Signs and Symptoms: - Discuss the causes of stress, how stress may lead to anxiety and depression, and ways to limit stress.   Stress II: Relaxation: -Discuss relaxation techniques to limit stress.   Oxygen for Home/Travel: - Discuss how to prepare for travel when on oxygen and proper ways to transport and store oxygen to ensure safety.   Knowledge Questionnaire Score:  Knowledge Questionnaire Score - 09/17/22 1347       Knowledge Questionnaire Score   Pre Score 16/18             Core Components/Risk Factors/Patient Goals at  Admission:  Personal Goals and Risk Factors at Admission - 09/17/22 1348       Core Components/Risk Factors/Patient Goals on Admission    Weight Management Obesity;Weight Maintenance    Improve shortness of breath with ADL's Yes    Intervention Provide education, individualized exercise plan and daily activity instruction to help decrease symptoms of SOB with activities of daily living.    Expected Outcomes Short Term: Improve cardiorespiratory fitness to achieve a reduction of symptoms when performing ADLs;Long Term: Be able to perform more ADLs without symptoms or delay the onset of symptoms    Diabetes Yes    Intervention Provide education about signs/symptoms and action to take for hypo/hyperglycemia.;Provide education about proper nutrition, including hydration, and aerobic/resistive  exercise prescription along with prescribed medications to achieve blood glucose in normal ranges: Fasting glucose 65-99 mg/dL    Expected Outcomes Short Term: Participant verbalizes understanding of the signs/symptoms and immediate care of hyper/hypoglycemia, proper foot care and importance of medication, aerobic/resistive exercise and nutrition plan for blood glucose control.;Long Term: Attainment of HbA1C < 7%.    Heart Failure Yes    Intervention Provide a combined exercise and nutrition program that is supplemented with education, support and counseling about heart failure. Directed toward relieving symptoms such as shortness of breath, decreased exercise tolerance, and extremity edema.    Expected Outcomes Short term: Attendance in program 2-3 days a week with increased exercise capacity. Reported lower sodium intake. Reported increased fruit and vegetable intake. Reports medication compliance.    Personal Goal Other Yes    Personal Goal Patient wants to improve her stamina and to be able to walk up stairs without SOB.    Intervention Patient will attend PR 2 days/week with exercise and education.     Expected Outcomes Patient will complete the program.             Core Components/Risk Factors/Patient Goals Review:   Goals and Risk Factor Review     Row Name 09/24/22 1424 10/20/22 1121           Core Components/Risk Factors/Patient Goals Review   Personal Goals Review Other;Weight Management/Obesity;Diabetes Other;Weight Management/Obesity;Diabetes      Review Pt is new to the program, and she was referred to PR due to dyspne on exertion.  Her current weight is 116.7 Kg.  She is exercising on RA with 02 sats from 95-97%.  Her goals are to improve her stamina and be able to walk up stairs.  We will continue to monitor her progress as she works toward meeting her goals. Patient has completed 8 sessions .  Her personal goal is to loss weight and current weight is 117.6 Kg.  We will encouage weight loss with decussion and hand outs.  Patient has Blood sugar levels at 180-'243mg'$ /dl prior to class.  She is exercising on RA with 02 sats from 96%-98%.  Her goals are to improve her stamina and be able to walk up stairs.  We will continue to monitor her progress as she works toward meeting her goals.      Expected Outcomes Pt will complete the program and meet her personal goals as well as the program goals. Pt will complete the program and meet her personal goals as well as the program goals.               Core Components/Risk Factors/Patient Goals at Discharge (Final Review):   Goals and Risk Factor Review - 10/20/22 1121       Core Components/Risk Factors/Patient Goals Review   Personal Goals Review Other;Weight Management/Obesity;Diabetes    Review Patient has completed 8 sessions .  Her personal goal is to loss weight and current weight is 117.6 Kg.  We will encouage weight loss with decussion and hand outs.  Patient has Blood sugar levels at 180-'243mg'$ /dl prior to class.  She is exercising on RA with 02 sats from 96%-98%.  Her goals are to improve her stamina and be able to walk up  stairs.  We will continue to monitor her progress as she works toward meeting her goals.    Expected Outcomes Pt will complete the program and meet her personal goals as well as the program goals.  ITP Comments:   Comments: ITP REVIEW Pt is making expected progress toward pulmonary rehab goals after completing 11 sessions. Recommend continued exercise, life style modification, education, and utilization of breathing techniques to increase stamina and strength and decrease shortness of breath with exertion.

## 2022-10-29 NOTE — Patient Instructions (Addendum)
Thank you, Ms.Viviann Resa for allowing [place] to provide your care today.   Acute pain of right shoulder - DG Shoulder Right - DG Cervical Spine Complete - Capsaicin, aspercreme, or biofreeze topically up to four times a day may also help with pain - I will follow up with results. If results suggest this is a muscle pain I will refer you to PT and we can discuss steroid injection in your shoulder if pain is uncontrolled.     Tamsen Snider, M.D.

## 2022-10-29 NOTE — Progress Notes (Unsigned)
   HPI:Julie Bean is a 72 y.o. female who presents for evaluation of right arm pain.. For the details of today's visit, please refer to the assessment and plan.  Physical Exam: Vitals:   10/29/22 1448  BP: 112/70  Pulse: 83  SpO2: 92%  Weight: 258 lb 0.6 oz (117 kg)  Height: 5' 8"$  (1.727 m)     Physical Exam Constitutional:      Appearance: Normal appearance. She is obese.  Musculoskeletal:     Right shoulder: No swelling.     Right upper arm: No swelling, edema or deformity.     Comments: R Shoulder: No obvious deformity or asymmetry. No bruising. No swelling No TTP Full ROM in flexion, abduction, internal/external rotation NV intact distally Special Tests:  - Impingement: Neg Hawkins and Neers.  - Supraspinatus: Negative empty can.  4/5 strength - Infraspinatus/Teres: 5/5 strength with ER - Subscapularis: 5/5 strength with IR - Biceps tendon: Negative Speeds.          Assessment & Plan:   [name] was seen today for arm pain.  Acute pain of right shoulder Assessment & Plan: Patient started havin right arm pain February 17.  The pain came on suddenly and was intense.  She has had no trauma.  She had shingles in September but this pain does not feel like shingles.  She gradually improved and then on Monday the pain returned and had intense moments.  Today she has some mild pain and pain with movement at times.  History of (DISH) diffuse idiopathic skeletal hyperostosis. Patient currently on indomethacin.  Right arm has not been hot red or swollen Plan: After exam I believe this is referred pain from patient's right shoulder.  Pain reproduced with empty can test.  Given history of DISH will proceed with x-rays. - DG Shoulder Right - DG Cervical Spine Complete - Capsaicin, aspercreme, or biofreeze topically up to four times a day may also help with pain - If results suggest this is a muscle pain I will refer you to PT and we can discuss steroid injection in  your shoulder if pain is uncontrolled.    Orders: -     DG Shoulder Right -     DG Cervical Spine Complete      Lorene Dy, MD

## 2022-10-30 ENCOUNTER — Encounter (HOSPITAL_COMMUNITY)
Admission: RE | Admit: 2022-10-30 | Discharge: 2022-10-30 | Disposition: A | Discharge status: Home / Self Care | Payer: Medicare PPO | Source: Ambulatory Visit | Attending: Internal Medicine | Admitting: Internal Medicine

## 2022-10-30 DIAGNOSIS — R0609 Other forms of dyspnea: Secondary | ICD-10-CM

## 2022-10-30 DIAGNOSIS — M25511 Pain in right shoulder: Secondary | ICD-10-CM | POA: Insufficient documentation

## 2022-10-30 NOTE — Progress Notes (Signed)
Daily Session Note  Patient Details  Name: Julie Bean MRN: [id number] Date of Birth: [birth year] Referring Provider:   Flowsheet Row CARDIAC REHAB PHASE II ORIENTATION from 09/17/2022 in [facility]  Referring Provider Dr. Guy Begin       Encounter Date: 10/30/2022  Check In:  Session Check In - 10/30/22 1330       Check-In   Supervising physician immediately available to respond to emergencies [facility] MD immediately available    Physician(s) Dr. Harl Bowie    Location [facility]    Staff Present Leana Roe, BS, Exercise Physiologist;Debra Wynetta Emery, RN, BSN;Zedrick Springsteen BSN, RN    Virtual Visit No    Medication changes reported     No    Fall or balance concerns reported    Yes    Comments Patient reports poor balance and has a history of falls.    Tobacco Cessation No Change    Warm-up and Cool-down Performed as group-led instruction    Resistance Training Performed Yes    VAD Patient? No    PAD/SET Patient? No      Pain Assessment   Currently in Pain? Yes    Pain Score 4     Pain Location Arm    Pain Orientation Right    Pain Descriptors / Indicators Aching    Pain Type Acute pain    Pain Onset 1 to 4 weeks ago    Pain Frequency Constant    Multiple Pain Sites No             Capillary Blood Glucose: No results found for this or any previous visit (from the past 24 hour(s)).    Social History   Tobacco Use  Smoking Status Never  Smokeless Tobacco Never  Tobacco Comments   Both parents smoked    Goals Met:  Proper associated with RPD/PD & O2 Sat Independence with exercise equipment Using PLB without cueing & demonstrates good technique Exercise tolerated well Queuing for purse lip breathing No report of concerns or symptoms today Strength training completed today  Goals Unmet:  Not Applicable  Comments: check out at 14:30   Dr. Kathie Dike is Medical Director for [facility]  [facility].

## 2022-10-30 NOTE — Assessment & Plan Note (Addendum)
Patient started havin right arm pain February 17.  The pain came on suddenly and was intense.  She has had no trauma.  She had shingles in September but this pain does not feel like shingles.  She gradually improved and then on Monday the pain returned and had intense moments.  Today she has some mild pain and pain with movement at times.  History of (DISH) diffuse idiopathic skeletal hyperostosis. Patient currently on indomethacin.  Right arm has not been hot red or swollen Plan: After exam I believe this is referred pain from patient's right shoulder.  Pain reproduced with empty can test.  Given history of DISH will proceed with x-rays. - DG Shoulder Right - DG Cervical Spine Complete - Capsaicin, aspercreme, or biofreeze topically up to four times a day may also help with pain - If results suggest this is a muscle pain I will refer you to PT and we can discuss steroid injection in your shoulder if pain is uncontrolled.

## 2022-10-31 ENCOUNTER — Other Ambulatory Visit: Payer: Self-pay | Admitting: Internal Medicine

## 2022-10-31 ENCOUNTER — Telehealth: Payer: Self-pay | Admitting: Internal Medicine

## 2022-10-31 ENCOUNTER — Encounter: Payer: Self-pay | Admitting: Internal Medicine

## 2022-10-31 DIAGNOSIS — M25511 Pain in right shoulder: Secondary | ICD-10-CM

## 2022-10-31 NOTE — Telephone Encounter (Signed)
Demetria called from [facility] out patient rehab. Received an order needs to say occupational therapy instead of physical therapy, asked to refax a new order.

## 2022-11-03 ENCOUNTER — Other Ambulatory Visit: Payer: Self-pay | Admitting: Internal Medicine

## 2022-11-03 DIAGNOSIS — M25511 Pain in right shoulder: Secondary | ICD-10-CM

## 2022-11-03 NOTE — Telephone Encounter (Signed)
Changed order to OT per rehab

## 2022-11-04 ENCOUNTER — Encounter (HOSPITAL_COMMUNITY)
Admission: RE | Admit: 2022-11-04 | Discharge: 2022-11-04 | Disposition: A | Discharge status: Home / Self Care | Payer: Medicare PPO | Source: Ambulatory Visit | Attending: Internal Medicine | Admitting: Internal Medicine

## 2022-11-04 DIAGNOSIS — R0609 Other forms of dyspnea: Secondary | ICD-10-CM | POA: Diagnosis present

## 2022-11-04 NOTE — Progress Notes (Signed)
Daily Session Note  Patient Details  Name: Julie Bean MRN: [id number] Date of Birth: 01-21-1951 Referring Provider:   Flowsheet Row CARDIAC REHAB PHASE II ORIENTATION from 09/17/2022 in [facility]  Referring Provider Dr. Guy Begin       Encounter Date: 11/04/2022  Check In:  Session Check In - 11/04/22 1330       Check-In   Supervising physician immediately available to respond to emergencies [facility] MD immediately available    Physician(s) Dr Harrington Challenger    Location [facility]    Staff Present Leana Roe, BS, Exercise Physiologist;Phyllis Billingsley, RN;Jaeshawn Silvio Hassell Done, RN, BSN    Virtual Visit No    Medication changes reported     No    Fall or balance concerns reported    Yes    Comments Patient reports poor balance and has a history of falls.    Tobacco Cessation No Change    Warm-up and Cool-down Performed as group-led instruction    Resistance Training Performed Yes    VAD Patient? No    PAD/SET Patient? No      Pain Assessment   Currently in Pain? No/denies    Pain Score 0-No pain    Multiple Pain Sites No             Capillary Blood Glucose: No results found for this or any previous visit (from the past 24 hour(s)).    Social History   Tobacco Use  Smoking Status Never  Smokeless Tobacco Never  Tobacco Comments   Both parents smoked    Goals Met:  Proper associated with RPD/PD & O2 Sat Independence with exercise equipment Using PLB without cueing & demonstrates good technique Exercise tolerated well Queuing for purse lip breathing No report of concerns or symptoms today Strength training completed today  Goals Unmet:  Not Applicable  Comments: Checkout at 1430.   Dr. Kathie Dike is Medical Director for [facility].

## 2022-11-06 ENCOUNTER — Encounter (HOSPITAL_COMMUNITY)
Admission: RE | Admit: 2022-11-06 | Discharge: 2022-11-06 | Disposition: A | Discharge status: Home / Self Care | Payer: Medicare PPO | Source: Ambulatory Visit | Attending: Internal Medicine | Admitting: Internal Medicine

## 2022-11-06 DIAGNOSIS — R0609 Other forms of dyspnea: Secondary | ICD-10-CM

## 2022-11-06 NOTE — Progress Notes (Signed)
Daily Session Note  Patient Details  Name: Julie Bean MRN: [id number] Date of Birth: 1950/09/03 Referring Provider:   Flowsheet Row CARDIAC REHAB PHASE II ORIENTATION from 09/17/2022 in [place]  Referring Provider Dr. Guy Begin       Encounter Date: 11/06/2022  Check In:  Session Check In - 11/06/22 1330       Check-In   Supervising physician immediately available to respond to emergencies [facility] MD immediately available    Physician(s) Dr. Harl Bowie    Location [facility]    Staff Present Leana Roe, BS, Exercise Physiologist;Dalton Sherrie George, MS, ACSM-CEP;Melven Sartorius BSN, RN    Virtual Visit No    Medication changes reported     No    Fall or balance concerns reported    Yes    Comments Patient reports poor balance and has a history of falls.    Tobacco Cessation No Change    Warm-up and Cool-down Performed as group-led instruction    Resistance Training Performed Yes    VAD Patient? No    PAD/SET Patient? No      Pain Assessment   Currently in Pain? Yes    Pain Score 4     Pain Location Arm    Pain Orientation Right;Left;Upper    Pain Descriptors / Indicators Aching    Pain Type Acute pain    Pain Onset 1 to 4 weeks ago    Pain Frequency Constant    Multiple Pain Sites No             Capillary Blood Glucose: No results found for this or any previous visit (from the past 24 hour(s)).    Social History   Tobacco Use  Smoking Status Never  Smokeless Tobacco Never  Tobacco Comments   Both parents smoked    Goals Met:  Proper associated with RPD/PD & O2 Sat Independence with exercise equipment Using PLB without cueing & demonstrates good technique Exercise tolerated well Queuing for purse lip breathing No report of concerns or symptoms today Strength training completed today  Goals Unmet:  Not Applicable  Comments: check out at 14:30   Dr. Kathie Dike is Medical Director for [facility] Pulmonary [facility].

## 2022-11-11 ENCOUNTER — Encounter (HOSPITAL_COMMUNITY)
Admission: RE | Admit: 2022-11-11 | Discharge: 2022-11-11 | Disposition: A | Discharge status: Home / Self Care | Payer: Medicare PPO | Source: Ambulatory Visit | Attending: Internal Medicine | Admitting: Internal Medicine

## 2022-11-11 VITALS — Wt 258.4 lb

## 2022-11-11 DIAGNOSIS — R0609 Other forms of dyspnea: Secondary | ICD-10-CM | POA: Diagnosis not present

## 2022-11-11 NOTE — Progress Notes (Signed)
Daily Session Note  Patient Details  Name: Julie Bean MRN: [id number] Date of Birth: 03/05/1951 Referring Provider:   Flowsheet Row CARDIAC REHAB PHASE II ORIENTATION from 09/17/2022 in [place]  Referring Provider Dr. Guy Begin       Encounter Date: 11/11/2022  Check In:  Session Check In - 11/11/22 1352       Check-In   Supervising physician immediately available to respond to emergencies --    Physician(s) --    Location --    Staff Present --    Virtual Visit --    Medication changes reported     --    Fall or balance concerns reported    --    Comments --    Tobacco Cessation --    Warm-up and Cool-down --    Resistance Training Performed --    VAD Patient? --      Pain Assessment   Currently in Pain? --    Pain Score --    Pain Location --    Pain Orientation --    Pain Descriptors / Indicators --    Pain Type --    Multiple Pain Sites --      2nd Pain Site   Pain Score --    Pain Orientation --    Pain Descriptors / Indicators --    Pain Type --             Capillary Blood Glucose: No results found for this or any previous visit (from the past 24 hour(s)).    Social History   Tobacco Use  Smoking Status Never  Smokeless Tobacco Never  Tobacco Comments   Both parents smoked    Goals Met:  Proper associated with RPD/PD & O2 Sat Independence with exercise equipment Using PLB without cueing & demonstrates good technique Exercise tolerated well Queuing for purse lip breathing No report of concerns or symptoms today Strength training completed today  Goals Unmet:  Not Applicable  Comments: Checkout at 1430.   Dr. Kathie Dike is Medical Director for [facility] Pulmonary Rehab.

## 2022-11-12 ENCOUNTER — Telehealth: Payer: Self-pay

## 2022-11-12 NOTE — Telephone Encounter (Signed)
MD signature obtained and faxed as requested.

## 2022-11-12 NOTE — Telephone Encounter (Signed)
**Note De-Identified Tyronne Blann Obfuscation** We received a [employer] form from [place] [employer].  I have completed the form and have e-mailed it to Dr Oralia Rud nurse so she can obtain his signature, date it, and to fax back to [facility] and [employer] at the fax number circled at the top of the form.

## 2022-11-13 ENCOUNTER — Encounter (HOSPITAL_COMMUNITY)
Admission: RE | Admit: 2022-11-13 | Discharge: 2022-11-13 | Disposition: A | Discharge status: Home / Self Care | Payer: Medicare PPO | Source: Ambulatory Visit | Attending: Internal Medicine | Admitting: Internal Medicine

## 2022-11-13 DIAGNOSIS — R0609 Other forms of dyspnea: Secondary | ICD-10-CM

## 2022-11-13 NOTE — Progress Notes (Signed)
Daily Session Note  Patient Details  Name: Julie Bean MRN: [id number] Date of Birth: [birth year] Referring Provider:   Flowsheet Row CARDIAC REHAB PHASE II ORIENTATION from 09/17/2022 in [place]  Referring Provider Dr. Guy Begin       Encounter Date: 11/13/2022  Check In:  Session Check In - 11/13/22 1330       Check-In   Supervising physician immediately available to respond to emergencies [facility] MD immediately available    Physician(s) Dr Dellia Cloud    Staff Present Leana Roe, BS, Exercise Physiologist;Kimori Tartaglia Wynetta Emery, RN, BSN;Other   Benedict Needy   Virtual Visit No    Medication changes reported     No    Fall or balance concerns reported    Yes    Comments Patient reports poor balance and has a history of falls.    Tobacco Cessation No Change    Warm-up and Cool-down Performed as group-led instruction    Resistance Training Performed Yes    VAD Patient? No    PAD/SET Patient? No      Pain Assessment   Currently in Pain? Yes    Pain Score 4     Pain Location Shoulder    Pain Orientation Left    Pain Descriptors / Indicators Aching    Pain Type Chronic pain    Pain Onset More than a month ago    Pain Frequency Constant      2nd Pain Site   Pain Score 3    Pain Type Chronic pain    Pain Location Shoulder    Pain Orientation Right    Pain Descriptors / Indicators Aching    Pain Frequency Constant      Pain   Pain Onset More than a month ago      Pain Screening   Effect of Pain on Daily Activities Limits upper body mobililty             Capillary Blood Glucose: No results found for this or any previous visit (from the past 24 hour(s)).    Social History   Tobacco Use  Smoking Status Never  Smokeless Tobacco Never  Tobacco Comments   Both parents smoked    Goals Met:  Proper associated with RPD/PD & O2 Sat Independence with exercise equipment Using PLB without cueing & demonstrates good technique Exercise  tolerated well No report of concerns or symptoms today Strength training completed today  Goals Unmet:  Not Applicable  Comments: Check out 1430.   Dr. Kathie Dike is Medical Director for [facility] Pulmonary Rehab.

## 2022-11-18 ENCOUNTER — Encounter (HOSPITAL_COMMUNITY)
Admission: RE | Admit: 2022-11-18 | Discharge: 2022-11-18 | Disposition: A | Discharge status: Home / Self Care | Payer: Medicare PPO | Source: Ambulatory Visit | Attending: Internal Medicine | Admitting: Internal Medicine

## 2022-11-18 DIAGNOSIS — R0609 Other forms of dyspnea: Secondary | ICD-10-CM

## 2022-11-18 NOTE — Progress Notes (Signed)
Daily Session Note  Patient Details  Name: Julie Bean MRN: [id number] Date of Birth: September 27, 1950 Referring Provider:   Flowsheet Row CARDIAC REHAB PHASE II ORIENTATION from 09/17/2022 in [place]  Referring Provider Dr. Guy Begin       Encounter Date: 11/18/2022  Check In:  Session Check In - 11/18/22 1330       Check-In   Supervising physician immediately available to respond to emergencies [facility] MD immediately available    Physician(s) Dr Dellia Cloud    Location [facility]    Staff Present Leana Roe, BS, Exercise Physiologist;Phyllis Billingsley, RN;Estevan Kersh Hassell Done, RN, BSN    Virtual Visit No    Medication changes reported     No    Fall or balance concerns reported    Yes    Comments Patient reports poor balance and has a history of falls.    Tobacco Cessation No Change    Warm-up and Cool-down Performed as group-led instruction    Resistance Training Performed Yes    VAD Patient? No    PAD/SET Patient? No      Pain Assessment   Currently in Pain? Yes    Pain Score 4     Pain Location Shoulder    Pain Orientation Left    Pain Type Chronic pain    Multiple Pain Sites Yes      2nd Pain Site   Pain Score 3    Pain Type Chronic pain    Pain Location Shoulder    Pain Orientation Right             Capillary Blood Glucose: No results found for this or any previous visit (from the past 24 hour(s)).    Social History   Tobacco Use  Smoking Status Never  Smokeless Tobacco Never  Tobacco Comments   Both parents smoked    Goals Met:  Proper associated with RPD/PD & O2 Sat Independence with exercise equipment Using PLB without cueing & demonstrates good technique Exercise tolerated well Queuing for purse lip breathing No report of concerns or symptoms today Strength training completed today  Goals Unmet:  Not Applicable  Comments: Checkout at 1430.   Dr. Kathie Dike is Medical Director for  [facility] Pulmonary Re[facility].

## 2022-11-20 ENCOUNTER — Encounter (HOSPITAL_COMMUNITY): Payer: Medicare PPO

## 2022-11-25 ENCOUNTER — Encounter (HOSPITAL_COMMUNITY)
Admission: RE | Admit: 2022-11-25 | Discharge: 2022-11-25 | Disposition: A | Discharge status: Home / Self Care | Payer: Medicare PPO | Source: Ambulatory Visit | Attending: Internal Medicine | Admitting: Internal Medicine

## 2022-11-25 VITALS — Wt 255.1 lb

## 2022-11-25 DIAGNOSIS — R0609 Other forms of dyspnea: Secondary | ICD-10-CM | POA: Diagnosis not present

## 2022-11-25 NOTE — Progress Notes (Signed)
Daily Session Note  Patient Details  Name: Julie Bean MRN: [id number] Date of Birth: 31-May-1951 Referring Provider:   Flowsheet Row CARDIAC REHAB PHASE II ORIENTATION from 09/17/2022 in [place]  Referring Provider Dr. Guy Begin       Encounter Date: 11/25/2022  Check In:  Session Check In - 11/25/22 1330       Check-In   Supervising physician immediately available to respond to emergencies [facility] MD immediately available    Physician(s) Dr. Domenic Polite    Location [facility]    Staff Present Leana Roe, BS, Exercise Physiologist;Disney Ruggiero, RN;Daphyne Hassell Done, RN, BSN;Dalton Fletcher MHA, MS, ACSM-CEP    Virtual Visit No    Medication changes reported     No    Fall or balance concerns reported    Yes    Comments Patient reports poor balance and has a history of falls.    Tobacco Cessation No Change    Warm-up and Cool-down Performed as group-led instruction    Resistance Training Performed Yes    VAD Patient? No    PAD/SET Patient? No      Pain Assessment   Currently in Pain? Yes    Pain Score 4     Pain Location Shoulder    Pain Orientation Left    Pain Descriptors / Indicators Aching    Pain Type Chronic pain    Pain Onset More than a month ago    Pain Frequency Constant    Multiple Pain Sites Yes      2nd Pain Site   Pain Score 3    Pain Type Chronic pain    Pain Location Shoulder    Pain Orientation Right    Pain Descriptors / Indicators Aching    Pain Frequency Constant      Pain   Pain Onset More than a month ago             Capillary Blood Glucose: No results found for this or any previous visit (from the past 24 hour(s)).    Social History   Tobacco Use  Smoking Status Never  Smokeless Tobacco Never  Tobacco Comments   Both parents smoked    Goals Met:  Proper associated with RPD/PD & O2 Sat Independence with exercise equipment Using PLB without cueing & demonstrates good  technique Exercise tolerated well No report of concerns or symptoms today Strength training completed today  Goals Unmet:  Not Applicable  Comments: check out @ 2:30pm   Dr. Kathie Dike is Medical Director for [facility] Pulmonary Rehab.

## 2022-11-26 NOTE — Progress Notes (Signed)
Pulmonary Individual Treatment Plan  Patient Details  Name: Julie Bean MRN: [id number] Date of Birth: May 07, 1951 Referring Provider:   Flowsheet Row CARDIAC REHAB PHASE II ORIENTATION from 09/17/2022 in [facility]  Referring Provider Dr. Guy Begin       Initial Encounter Date:  Flowsheet Row CARDIAC REHAB PHASE II ORIENTATION from 09/17/2022 in [facility]  Date 09/17/22       Visit Diagnosis: DOE (dyspnea on exertion)  Patient's Home Medications on Admission:   Current Outpatient Medications:    acetaminophen (TYLENOL) 500 MG tablet, Take 1,000 mg by mouth every 8 (eight) hours as needed for moderate pain., Disp: , Rfl:    amLODipine (NORVASC) 5 MG tablet, Take 1 tablet (5 mg total) by mouth daily., Disp: 90 tablet, Rfl: 3   dapagliflozin propanediol (FARXIGA) 10 MG TABS tablet, Take 1 tablet (10 mg total) by mouth daily before breakfast., Disp: 90 tablet, Rfl: 3   diclofenac Sodium (VOLTAREN) 1 % GEL, SMARTSIG:2 Gram(s) Topical 3 Times Daily PRN, Disp: , Rfl:    divalproex (DEPAKOTE) 500 MG DR tablet, Take 500 mg by mouth 3 (three) times daily., Disp: , Rfl:    glipiZIDE (GLUCOTROL XL) 10 MG 24 hr tablet, TAKE ONE TABLET (10 MG TOTAL) BY MOUTH DAILY WITH BREAKFAST., Disp: 90 tablet, Rfl: 0   glucose blood (CONTOUR TEST) test strip, Use as instructed once daily testing dx e11.9, Disp: 100 each, Rfl: 5   indomethacin (INDOCIN) 50 MG capsule, Take 1 capsule (50 mg total) by mouth 2 (two) times daily with a meal., Disp: 180 capsule, Rfl: 3   levothyroxine (SYNTHROID) 150 MCG tablet, Take 1 tablet (150 mcg total) by mouth every other day., Disp: 15 tablet, Rfl: 2   levothyroxine (SYNTHROID) 175 MCG tablet, Take 1 tablet (175 mcg total) by mouth every other day., Disp: 15 tablet, Rfl: 2   metFORMIN (GLUCOPHAGE) 500 MG tablet, Take 1 tablet (500 mg total) by mouth 3 (three) times daily., Disp: 270 tablet, Rfl: 3   metoprolol succinate  (TOPROL-XL) 25 MG 24 hr tablet, Take 1 tablet (25 mg total) by mouth daily., Disp: 90 tablet, Rfl: 3   Multiple Vitamins-Minerals (PRESERVISION AREDS 2+MULTI VIT PO), Take 1 tablet by mouth 2 (two) times daily., Disp: , Rfl:    omeprazole (PRILOSEC) 40 MG capsule, Take 1 capsule (40 mg total) by mouth daily., Disp: 90 capsule, Rfl: 3   rosuvastatin (CRESTOR) 10 MG tablet, Take 1 tablet (10 mg total) by mouth daily., Disp: 90 tablet, Rfl: 3   torsemide (DEMADEX) 20 MG tablet, TAKE TWO TABLETS ('40MG'$  TOTAL) BY MOUTH DAILY, Disp: 180 tablet, Rfl: 2  Past Medical History: Past Medical History:  Diagnosis Date   Arthritis    knees, shoulder, neck and hip   Cataract    [employer] 09/18/2020   CHF (congestive heart failure) (HCC)    Chronic kidney disease    Closed fracture of left proximal humerus 04/07/2018   Diabetes mellitus without complication (HCC)    Type II   DISH (diffuse idiopathic skeletal hyperostosis)    Encounter for well adult exam with abnormal findings 08/10/2022   GERD (gastroesophageal reflux disease)    Headache, hemicrania continua    Hyperlipidemia    [employer] 09/18/2020   Hypertension    Hypothyroidism    Macular degeneration    Macular degeneration    followed by Dr. Manuella Ghazi in [place]   PONV (postoperative nausea and vomiting)    Proximal humerus fracture  Left   Thyroid disease    [employer] 09/18/2020    Tobacco Use: Social History   Tobacco Use  Smoking Status Never  Smokeless Tobacco Never  Tobacco Comments   Both parents smoked    Labs: Review Flowsheet  More data exists      Latest Ref Rng & Units 03/05/2021 06/04/2021 03/12/2022 07/08/2022 07/31/2022  Labs for ITP Cardiac and Pulmonary Rehab  Cholestrol 100 - 199 mg/dL 163  162  168  - 184   LDL (calc) 0 - 99 mg/dL 76  76  61  - 68   HDL-C >39 mg/dL 57  45  49  - 42   Trlycerides 0 - 149 mg/dL 178  252  379  - 481   Hemoglobin A1c 4.8 - 5.6 % 6.3  6.8  8.6  8.6  8.8     Capillary Blood  Glucose: Lab Results  Component Value Date   GLUCAP 208 (H) 09/30/2022   GLUCAP 243 (H) 09/25/2022   GLUCAP 239 (H) 09/23/2022   GLUCAP 228 (H) 09/17/2022   GLUCAP 163 (H) 06/28/2021     Pulmonary Assessment Scores:  Pulmonary Assessment Scores     Row Name 09/17/22 1351         ADL UCSD   ADL Phase Entry     SOB Score total 17     Rest 0     Walk 0     Stairs 4     Bath 0     Dress 0     Shop 1       CAT Score   CAT Score 6       mMRC Score   mMRC Score 2             UCSD: Self-administered rating of dyspnea associated with activities of daily living (ADLs) 6-point scale (0 = "not at all" to 5 = "maximal or unable to do because of breathlessness")  Scoring Scores range from 0 to 120.  Minimally important difference is 5 units  CAT: CAT can identify the health impairment of COPD patients and is better correlated with disease progression.  CAT has a scoring range of zero to 40. The CAT score is classified into four groups of low (less than 10), medium (10 - 20), high (21-30) and very high (31-40) based on the impact level of disease on health status. A CAT score over 10 suggests significant symptoms.  A worsening CAT score could be explained by an exacerbation, poor medication adherence, poor inhaler technique, or progression of COPD or comorbid conditions.  CAT MCID is 2 points  mMRC: mMRC (Modified Medical Research Council) Dyspnea Scale is used to assess the degree of baseline functional disability in patients of respiratory disease due to dyspnea. No minimal important difference is established. A decrease in score of 1 point or greater is considered a positive change.   Pulmonary Function Assessment:   Exercise Target Goals: Exercise Program Goal: Individual exercise prescription set using results from initial 6 min walk test and THRR while considering  patient's activity barriers and safety.   Exercise Prescription Goal: Initial exercise prescription  builds to 30-45 minutes a day of aerobic activity, 2-3 days per week.  Home exercise guidelines will be given to patient during program as part of exercise prescription that the participant will acknowledge.  Activity Barriers & Risk Stratification:  Activity Barriers & Cardiac Risk Stratification - 09/17/22 1303       Activity Barriers & Cardiac  Risk Stratification   Activity Barriers Back Problems;Neck/Spine Problems;Joint Problems;Shortness of Breath;Balance Concerns;History of Falls    Cardiac Risk Stratification High             6 Minute Walk:  6 Minute Walk     Row Name 09/17/22 1406         6 Minute Walk   Phase Initial     Distance 800 feet     Walk Time 6 minutes     # of Rest Breaks 0     MPH 1.51     METS 1.38     RPE 13     Perceived Dyspnea  11     VO2 Peak 4.85     Symptoms No     Comments none     Resting HR 80 bpm     Resting BP 100/56     Resting Oxygen Saturation  96 %     Exercise Oxygen Saturation  during 6 min walk 97 %     Max Ex. HR 115 bpm     Max Ex. BP 120/60     2 Minute Post BP 110/58       Interval HR   1 Minute HR 104     2 Minute HR 107     3 Minute HR 110     4 Minute HR 112     5 Minute HR 114     6 Minute HR 115     2 Minute Post HR 81     Interval Heart Rate? Yes       Interval Oxygen   Interval Oxygen? Yes     Baseline Oxygen Saturation % 96 %     1 Minute Oxygen Saturation % 97 %     1 Minute Liters of Oxygen 0 L     2 Minute Oxygen Saturation % 97 %     2 Minute Liters of Oxygen 0 L     3 Minute Oxygen Saturation % 97 %     3 Minute Liters of Oxygen 0 L     4 Minute Oxygen Saturation % 97 %     4 Minute Liters of Oxygen 0 L     5 Minute Oxygen Saturation % 98 %     5 Minute Liters of Oxygen 0 L     6 Minute Oxygen Saturation % 98 %     6 Minute Liters of Oxygen 0 L     2 Minute Post Oxygen Saturation % 98 %     2 Minute Post Liters of Oxygen 0 L              Oxygen Initial Assessment:  Oxygen Initial  Assessment - 09/17/22 1351       Home Oxygen   Home Oxygen Device None    Sleep Oxygen Prescription None    Home Exercise Oxygen Prescription None    Home Resting Oxygen Prescription None      Initial 6 min Walk   Oxygen Used None      Program Oxygen Prescription   Program Oxygen Prescription None      Intervention   Short Term Goals To learn and understand importance of monitoring SPO2 with pulse oximeter and demonstrate accurate use of the pulse oximeter.;To learn and understand importance of maintaining oxygen saturations>88%;To learn and demonstrate proper pursed lip breathing techniques or other breathing techniques.     Long  Term Goals Verbalizes importance of monitoring SPO2  with pulse oximeter and return demonstration;Maintenance of O2 saturations>88%;Exhibits proper breathing techniques, such as pursed lip breathing or other method taught during program session             Oxygen Re-Evaluation:  Oxygen Re-Evaluation     Row Name 09/30/22 1552 10/28/22 1551 11/25/22 1534         Program Oxygen Prescription   Program Oxygen Prescription None None None       Home Oxygen   Home Oxygen Device None None None     Sleep Oxygen Prescription None None None     Home Exercise Oxygen Prescription None None None     Home Resting Oxygen Prescription None None None     Compliance with Home Oxygen Use Yes Yes Yes       Goals/Expected Outcomes   Short Term Goals To learn and understand importance of monitoring SPO2 with pulse oximeter and demonstrate accurate use of the pulse oximeter.;To learn and understand importance of maintaining oxygen saturations>88%;To learn and demonstrate proper pursed lip breathing techniques or other breathing techniques.  To learn and understand importance of monitoring SPO2 with pulse oximeter and demonstrate accurate use of the pulse oximeter.;To learn and understand importance of maintaining oxygen saturations>88%;To learn and demonstrate proper  pursed lip breathing techniques or other breathing techniques.  To learn and understand importance of monitoring SPO2 with pulse oximeter and demonstrate accurate use of the pulse oximeter.;To learn and understand importance of maintaining oxygen saturations>88%;To learn and demonstrate proper pursed lip breathing techniques or other breathing techniques.      Long  Term Goals Verbalizes importance of monitoring SPO2 with pulse oximeter and return demonstration;Maintenance of O2 saturations>88%;Exhibits proper breathing techniques, such as pursed lip breathing or other method taught during program session Verbalizes importance of monitoring SPO2 with pulse oximeter and return demonstration;Maintenance of O2 saturations>88%;Exhibits proper breathing techniques, such as pursed lip breathing or other method taught during program session Verbalizes importance of monitoring SPO2 with pulse oximeter and return demonstration;Maintenance of O2 saturations>88%;Exhibits proper breathing techniques, such as pursed lip breathing or other method taught during program session     Goals/Expected Outcomes compliance compliance compliance              Oxygen Discharge (Final Oxygen Re-Evaluation):  Oxygen Re-Evaluation - 11/25/22 1534       Program Oxygen Prescription   Program Oxygen Prescription None      Home Oxygen   Home Oxygen Device None    Sleep Oxygen Prescription None    Home Exercise Oxygen Prescription None    Home Resting Oxygen Prescription None    Compliance with Home Oxygen Use Yes      Goals/Expected Outcomes   Short Term Goals To learn and understand importance of monitoring SPO2 with pulse oximeter and demonstrate accurate use of the pulse oximeter.;To learn and understand importance of maintaining oxygen saturations>88%;To learn and demonstrate proper pursed lip breathing techniques or other breathing techniques.     Long  Term Goals Verbalizes importance of monitoring SPO2 with pulse  oximeter and return demonstration;Maintenance of O2 saturations>88%;Exhibits proper breathing techniques, such as pursed lip breathing or other method taught during program session    Goals/Expected Outcomes compliance             Initial Exercise Prescription:  Initial Exercise Prescription - 09/17/22 1400       Date of Initial Exercise RX and Referring Provider   Date 09/17/22    Referring Provider Dr. Guy Begin    Expected  Discharge Date 01/20/23      NuStep   Level 1    SPM 60    Minutes 22      Arm Ergometer   Level 1    RPM 40    Minutes 17      Prescription Details   Frequency (times per week) 2    Duration Progress to 30 minutes of continuous aerobic without signs/symptoms of physical distress      Intensity   THRR 40-80% of Max Heartrate 60-120    Ratings of Perceived Exertion 11-13    Perceived Dyspnea 0-4      Resistance Training   Training Prescription Yes    Weight 3    Reps 10-15             Perform Capillary Blood Glucose checks as needed.  Exercise Prescription Changes:   Exercise Prescription Changes     Row Name 09/30/22 1500 10/14/22 1500 10/23/22 1500 11/11/22 1400 11/25/22 1500     Response to Exercise   Blood Pressure (Admit) 114/66 130/64 114/68 114/64 108/62   Blood Pressure (Exercise) 126/68 128/62 126/70 118/70 120/62   Blood Pressure (Exit) 120/62 108/56 116/60 112/60 112/62   Heart Rate (Admit) 82 bpm 77 bpm 83 bpm 77 bpm 92 bpm   Heart Rate (Exercise) 85 bpm 88 bpm 86 bpm 80 bpm 87 bpm   Heart Rate (Exit) 85 bpm 81 bpm 86 bpm 77 bpm 87 bpm   Oxygen Saturation (Admit) 96 % 97 % 96 % 98 % 94 %   Oxygen Saturation (Exercise) 97 % 96 % 96 % 96 % 94 %   Oxygen Saturation (Exit) 95 % 97 % 98 % 96 % 94 %   Rating of Perceived Exertion (Exercise) 12 12 12 12 12    Perceived Dyspnea (Exercise) 12 12 12 12 12    Duration Continue with 30 min of aerobic exercise without signs/symptoms of physical distress. Continue with 30 min of  aerobic exercise without signs/symptoms of physical distress. Continue with 30 min of aerobic exercise without signs/symptoms of physical distress. Continue with 30 min of aerobic exercise without signs/symptoms of physical distress. Continue with 30 min of aerobic exercise without signs/symptoms of physical distress.   Intensity THRR unchanged THRR unchanged THRR unchanged THRR unchanged THRR unchanged     Progression   Progression Continue to progress workloads to maintain intensity without signs/symptoms of physical distress. Continue to progress workloads to maintain intensity without signs/symptoms of physical distress. Continue to progress workloads to maintain intensity without signs/symptoms of physical distress. Continue to progress workloads to maintain intensity without signs/symptoms of physical distress. Continue to progress workloads to maintain intensity without signs/symptoms of physical distress.     Resistance Training   Training Prescription Yes Yes Yes Yes Yes   Weight 2 3 3 2 2    Reps 10-15 10-15 10-15 10-15 10-15   Time 10 Minutes 10 Minutes 10 Minutes 10 Minutes 10 Minutes     NuStep   Level 1 3 3 3 4    SPM 69 77 92 95 100   Minutes 17 17 39 39 39   METs 1.7 1.8 1.8 1.8 1.9     Arm Ergometer   Level 1 1 -- -- --   RPM 21 26 -- -- --   Minutes 17 22 -- -- --   METs 1.4 1.3 -- -- --            Exercise Comments:   Exercise Goals and Review:  Exercise Goals     Row Name 09/17/22 1410 09/30/22 1554 10/28/22 1549 11/25/22 1529       Exercise Goals   Increase Physical Activity Yes Yes Yes Yes    Intervention Provide advice, education, support and counseling about physical activity/exercise needs.;Develop an individualized exercise prescription for aerobic and resistive training based on initial evaluation findings, risk stratification, comorbidities and participant's personal goals. Provide advice, education, support and counseling about physical  activity/exercise needs.;Develop an individualized exercise prescription for aerobic and resistive training based on initial evaluation findings, risk stratification, comorbidities and participant's personal goals. Provide advice, education, support and counseling about physical activity/exercise needs.;Develop an individualized exercise prescription for aerobic and resistive training based on initial evaluation findings, risk stratification, comorbidities and participant's personal goals. Provide advice, education, support and counseling about physical activity/exercise needs.;Develop an individualized exercise prescription for aerobic and resistive training based on initial evaluation findings, risk stratification, comorbidities and participant's personal goals.    Expected Outcomes Short Term: Attend rehab on a regular basis to increase amount of physical activity.;Long Term: Add in home exercise to make exercise part of routine and to increase amount of physical activity.;Long Term: Exercising regularly at least 3-5 days a week. Short Term: Attend rehab on a regular basis to increase amount of physical activity.;Long Term: Add in home exercise to make exercise part of routine and to increase amount of physical activity.;Long Term: Exercising regularly at least 3-5 days a week. Short Term: Attend rehab on a regular basis to increase amount of physical activity.;Long Term: Add in home exercise to make exercise part of routine and to increase amount of physical activity.;Long Term: Exercising regularly at least 3-5 days a week. Short Term: Attend rehab on a regular basis to increase amount of physical activity.;Long Term: Add in home exercise to make exercise part of routine and to increase amount of physical activity.;Long Term: Exercising regularly at least 3-5 days a week.    Increase Strength and Stamina Yes Yes Yes Yes    Intervention Provide advice, education, support and counseling about physical  activity/exercise needs.;Develop an individualized exercise prescription for aerobic and resistive training based on initial evaluation findings, risk stratification, comorbidities and participant's personal goals. Provide advice, education, support and counseling about physical activity/exercise needs.;Develop an individualized exercise prescription for aerobic and resistive training based on initial evaluation findings, risk stratification, comorbidities and participant's personal goals. Provide advice, education, support and counseling about physical activity/exercise needs.;Develop an individualized exercise prescription for aerobic and resistive training based on initial evaluation findings, risk stratification, comorbidities and participant's personal goals. Provide advice, education, support and counseling about physical activity/exercise needs.;Develop an individualized exercise prescription for aerobic and resistive training based on initial evaluation findings, risk stratification, comorbidities and participant's personal goals.    Expected Outcomes Short Term: Increase workloads from initial exercise prescription for resistance, speed, and METs.;Short Term: Perform resistance training exercises routinely during rehab and add in resistance training at home;Long Term: Improve cardiorespiratory fitness, muscular endurance and strength as measured by increased METs and functional capacity (6MWT) Short Term: Increase workloads from initial exercise prescription for resistance, speed, and METs.;Short Term: Perform resistance training exercises routinely during rehab and add in resistance training at home;Long Term: Improve cardiorespiratory fitness, muscular endurance and strength as measured by increased METs and functional capacity (6MWT) Short Term: Increase workloads from initial exercise prescription for resistance, speed, and METs.;Short Term: Perform resistance training exercises routinely during rehab  and add in resistance training at home;Long Term: Improve cardiorespiratory fitness, muscular  endurance and strength as measured by increased METs and functional capacity (6MWT) Short Term: Increase workloads from initial exercise prescription for resistance, speed, and METs.;Short Term: Perform resistance training exercises routinely during rehab and add in resistance training at home;Long Term: Improve cardiorespiratory fitness, muscular endurance and strength as measured by increased METs and functional capacity (6MWT)    Able to understand and use rate of perceived exertion (RPE) scale Yes Yes Yes Yes    Intervention Provide education and explanation on how to use RPE scale Provide education and explanation on how to use RPE scale Provide education and explanation on how to use RPE scale Provide education and explanation on how to use RPE scale    Expected Outcomes Short Term: Able to use RPE daily in rehab to express subjective intensity level;Long Term:  Able to use RPE to guide intensity level when exercising independently Short Term: Able to use RPE daily in rehab to express subjective intensity level;Long Term:  Able to use RPE to guide intensity level when exercising independently Short Term: Able to use RPE daily in rehab to express subjective intensity level;Long Term:  Able to use RPE to guide intensity level when exercising independently Short Term: Able to use RPE daily in rehab to express subjective intensity level;Long Term:  Able to use RPE to guide intensity level when exercising independently    Able to understand and use Dyspnea scale -- -- -- Yes    Intervention -- -- -- Provide education and explanation on how to use Dyspnea scale    Expected Outcomes -- -- -- Short Term: Able to use Dyspnea scale daily in rehab to express subjective sense of shortness of breath during exertion;Long Term: Able to use Dyspnea scale to guide intensity level when exercising independently    Knowledge and  understanding of Target Heart Rate Range (THRR) Yes Yes Yes Yes    Intervention Provide education and explanation of THRR including how the numbers were predicted and where they are located for reference Provide education and explanation of THRR including how the numbers were predicted and where they are located for reference Provide education and explanation of THRR including how the numbers were predicted and where they are located for reference Provide education and explanation of THRR including how the numbers were predicted and where they are located for reference    Expected Outcomes Short Term: Able to state/look up THRR;Long Term: Able to use THRR to govern intensity when exercising independently;Short Term: Able to use daily as guideline for intensity in rehab Short Term: Able to state/look up THRR;Long Term: Able to use THRR to govern intensity when exercising independently;Short Term: Able to use daily as guideline for intensity in rehab Short Term: Able to state/look up THRR;Long Term: Able to use THRR to govern intensity when exercising independently;Short Term: Able to use daily as guideline for intensity in rehab Short Term: Able to state/look up THRR;Long Term: Able to use THRR to govern intensity when exercising independently;Short Term: Able to use daily as guideline for intensity in rehab    Able to check pulse independently Yes Yes Yes Yes    Intervention Provide education and demonstration on how to check pulse in carotid and radial arteries.;Review the importance of being able to check your own pulse for safety during independent exercise Provide education and demonstration on how to check pulse in carotid and radial arteries.;Review the importance of being able to check your own pulse for safety during independent exercise Provide education and demonstration  on how to check pulse in carotid and radial arteries.;Review the importance of being able to check your own pulse for safety during  independent exercise Provide education and demonstration on how to check pulse in carotid and radial arteries.;Review the importance of being able to check your own pulse for safety during independent exercise    Expected Outcomes Short Term: Able to explain why pulse checking is important during independent exercise;Long Term: Able to check pulse independently and accurately Short Term: Able to explain why pulse checking is important during independent exercise;Long Term: Able to check pulse independently and accurately Short Term: Able to explain why pulse checking is important during independent exercise;Long Term: Able to check pulse independently and accurately Short Term: Able to explain why pulse checking is important during independent exercise;Long Term: Able to check pulse independently and accurately    Understanding of Exercise Prescription Yes Yes Yes Yes    Intervention Provide education, explanation, and written materials on patient's individual exercise prescription Provide education, explanation, and written materials on patient's individual exercise prescription Provide education, explanation, and written materials on patient's individual exercise prescription Provide education, explanation, and written materials on patient's individual exercise prescription    Expected Outcomes Short Term: Able to explain program exercise prescription;Long Term: Able to explain home exercise prescription to exercise independently Short Term: Able to explain program exercise prescription;Long Term: Able to explain home exercise prescription to exercise independently Short Term: Able to explain program exercise prescription;Long Term: Able to explain home exercise prescription to exercise independently Short Term: Able to explain program exercise prescription;Long Term: Able to explain home exercise prescription to exercise independently             Exercise Goals Re-Evaluation :  Exercise Goals  Re-Evaluation     Row Name 09/30/22 1555 10/28/22 1549 11/25/22 1530         Exercise Goal Re-Evaluation   Exercise Goals Review Increase Physical Activity;Increase Strength and Stamina;Able to understand and use rate of perceived exertion (RPE) scale;Able to understand and use Dyspnea scale;Knowledge and understanding of Target Heart Rate Range (THRR);Understanding of Exercise Prescription Increase Physical Activity;Increase Strength and Stamina;Able to understand and use rate of perceived exertion (RPE) scale;Knowledge and understanding of Target Heart Rate Range (THRR);Able to check pulse independently;Understanding of Exercise Prescription Increase Physical Activity;Increase Strength and Stamina;Able to understand and use rate of perceived exertion (RPE) scale;Able to understand and use Dyspnea scale;Knowledge and understanding of Target Heart Rate Range (THRR);Able to check pulse independently;Understanding of Exercise Prescription     Comments Pt has completed 4 sessions of PR. She is motivated to progress in the program. She is deconditioned, but she should be able to progress well. She is currently exercising at 1.7 METs on the stepper. Will continue to monitor and progress as able. Pt has completed 10 sessions of PR. She is motivated top be in the program and is progressing. SHe recently hurt her R bicep area and has been using the stepper for both sessions during class. She is currently exerciisng at 1.8 METs on the stepper. Will continue to monitor and progress as able, Pt has completed 17 sessions of PR. She tolerates exercising well and has increased her workload on the stepper. She continues to only use the stepper for both sessions due to RUE pain (2/10) and also complains of LUE pain (4/10). She does not seem as motivated during class compaired to the start of coming. She is currently exercising at 1.9 METs on the stepper. Will continue  to monitor and progress as able.     Expected Outcomes  Through exercise at rehab and at home, the patient will meet their stated goals. Through exercise at rehab and at home, the patient will meet their stated goals. Through exercise at rehab and at home, the patient will meet their stated goals.              Discharge Exercise Prescription (Final Exercise Prescription Changes):  Exercise Prescription Changes - 11/25/22 1500       Response to Exercise   Blood Pressure (Admit) 108/62    Blood Pressure (Exercise) 120/62    Blood Pressure (Exit) 112/62    Heart Rate (Admit) 92 bpm    Heart Rate (Exercise) 87 bpm    Heart Rate (Exit) 87 bpm    Oxygen Saturation (Admit) 94 %    Oxygen Saturation (Exercise) 94 %    Oxygen Saturation (Exit) 94 %    Rating of Perceived Exertion (Exercise) 12    Perceived Dyspnea (Exercise) 12    Duration Continue with 30 min of aerobic exercise without signs/symptoms of physical distress.    Intensity THRR unchanged      Progression   Progression Continue to progress workloads to maintain intensity without signs/symptoms of physical distress.      Resistance Training   Training Prescription Yes    Weight 2    Reps 10-15    Time 10 Minutes      NuStep   Level 4    SPM 100    Minutes 39    METs 1.9             Nutrition:  Target Goals: Understanding of nutrition guidelines, daily intake of sodium 1500mg , cholesterol 200mg , calories 30% from fat and 7% or less from saturated fats, daily to have 5 or more servings of fruits and vegetables.  Biometrics:  Pre Biometrics - 09/17/22 1410       Pre Biometrics   Height 5\' 8"  (1.727 m)    Weight 117.6 kg    Waist Circumference 52 inches    Hip Circumference 48 inches    Waist to Hip Ratio 1.08 %    BMI (Calculated) 39.43    Triceps Skinfold 40 mm    % Body Fat 52.6 %    Grip Strength 15 kg    Flexibility 0 in    Single Leg Stand 0 seconds              Nutrition Therapy Plan and Nutrition Goals:  Nutrition Therapy & Goals -  09/17/22 1344       Personal Nutrition Goals   Additional Goals? No    Comments Patient scored 36 on her diet assessment. She says she follows a low carb portion control diet similiar to the exchange DM diet. Handouts provided and explained regarding healthier choices and DM information. We offer 2 educational sessions on heart healthy nutrition with handouts and assistance with RD referral if patient is interested.      Intervention Plan   Intervention Nutrition handout(s) given to patient.    Expected Outcomes Short Term Goal: Understand basic principles of dietary content, such as calories, fat, sodium, cholesterol and nutrients.             Nutrition Assessments:  Nutrition Assessments - 09/17/22 1344       MEDFICTS Scores   Pre Score 36            MEDIFICTS Score Key: ?70 Need to  make dietary changes  40-70 Heart Healthy Diet ? 40 Therapeutic Level Cholesterol Diet   Picture Your Plate Scores: [id number] Unhealthy dietary pattern with much room for improvement. 41-50 Dietary pattern unlikely to meet recommendations for good health and room for improvement. 51-60 More healthful dietary pattern, with some room for improvement.  >60 Healthy dietary pattern, although there may be some specific behaviors that could be improved.    Nutrition Goals Re-Evaluation:   Nutrition Goals Discharge (Final Nutrition Goals Re-Evaluation):   Psychosocial: Target Goals: Acknowledge presence or absence of significant depression and/or stress, maximize coping skills, provide positive support system. Participant is able to verbalize types and ability to use techniques and skills needed for reducing stress and depression.  Initial Review & Psychosocial Screening:  Initial Psych Review & Screening - 09/17/22 1352       Initial Review   Current issues with None Identified      Family Dynamics   Good Support System? Yes      Barriers   Psychosocial barriers to participate in program  There are no identifiable barriers or psychosocial needs.      Screening Interventions   Interventions Encouraged to exercise;To provide support and resources with identified psychosocial needs    Expected Outcomes Short Term goal: Identification and review with participant of any Quality of Life or Depression concerns found by scoring the questionnaire.             Quality of Life Scores:  Quality of Life - 09/17/22 1413       Quality of Life   Select Quality of Life      Quality of Life Scores   Health/Function Pre 21.75 %    Socioeconomic Pre 26.43 %    Psych/Spiritual Pre 25.93 %    Family Pre 27.17 %    GLOBAL Pre 24.12 %            Scores of 19 and below usually indicate a poorer quality of life in these areas.  A difference of  2-3 points is a clinically meaningful difference.  A difference of 2-3 points in the total score of the Quality of Life Index has been associated with significant improvement in overall quality of life, self-image, physical symptoms, and general health in studies assessing change in quality of life.   PHQ-9: Review Flowsheet  More data exists      10/29/2022 10/07/2022 09/17/2022 09/09/2022 08/05/2022  Depression screen PHQ 2/9  Decreased Interest 0 0 0 0 0  Down, Depressed, Hopeless 0 0 0 0 0  PHQ - 2 Score 0 0 0 0 0  Altered sleeping 1 1 0 - 1  Tired, decreased energy 0 0 0 - 1  Change in appetite 1 1 0 - 1  Feeling bad or failure about yourself  0 0 0 - 0  Trouble concentrating 0 0 0 - 0  Moving slowly or fidgety/restless 0 0 0 - 0  Suicidal thoughts 0 0 0 - 0  PHQ-9 Score 2 2 0 - 3  Difficult doing work/chores - - Not difficult at all - -   Interpretation of Total Score  Total Score Depression Severity:  1-4 = Minimal depression, 5-9 = Mild depression, 10-14 = Moderate depression, 15-19 = Moderately severe depression, 20-27 = Severe depression   Psychosocial Evaluation and Intervention:  Psychosocial Evaluation - 09/17/22 1353        Psychosocial Evaluation & Interventions   Interventions Stress management education;Relaxation education;Encouraged to exercise with the  program and follow exercise prescription    Comments Patient has no psychosocial barriers identified at her orientation visit. Her PHQ-9 score was 0. She is a retired [employer]  serving for over 30 years. 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 She is originally from [place] so she moved back to [place] when she retired. She and her sister brought a house together after her sister's husband died. She says she has a good support system with her sister and her neices and nephews and chruch family. She denies any depression or anxiety. She is a very pleasant 72 year old looking forward to participating in the program. She has stairs in her house and she wants to be able to climb them without SOB and improve her stamina.    Expected Outcomes Patient will continue to have no psychosocial barriers identified.    Continue Psychosocial Services  No Follow up required             Psychosocial Re-Evaluation:  Psychosocial Re-Evaluation     [place] Name 09/24/22 1421 10/20/22 1114 11/18/22 1501         Psychosocial Re-Evaluation   Current issues with None Identified None Identified --     Comments -- Patient has completed 8 sessions.   She seems to enjoy coming to class and  is very quiet, not very talkative.  She continues to have no psychosocial issues identified.  Patient continues to demonstrate a positvie outlook.   We will encourage more interaction with staff and class.   We will continue to monitor as she progresses in the program. Patient has completed 16 sessions.   She seems to enjoy coming to class and  is very quiet, not very talkative.  She continues to have no psychosocial issues identified.  Patient continues to demonstrate a positvie  outlook.   We will encourage more interaction with staff and class.   We will continue to monitor as she progresses in the program.     Expected Outcomes Patient is new to the program, and she has only completed 1 session.  She has no psychosocial barriers.  We will continue to monitor her progress. Patient will have no psychosocial issuses identified at discharge. Patient will have no psychosocial issuses identified at discharge.     Interventions Stress management education;Relaxation education;Encouraged to attend Pulmonary Rehabilitation for the exercise Stress management education;Relaxation education;Encouraged to attend Pulmonary Rehabilitation for the exercise Stress management education;Relaxation education;Encouraged to attend Pulmonary Rehabilitation for the exercise     Continue Psychosocial Services  No Follow up required No Follow up required No Follow up required              Psychosocial Discharge (Final Psychosocial Re-Evaluation):  Psychosocial Re-Evaluation - 11/18/22 1501       Psychosocial Re-Evaluation   Comments Patient has completed 16 sessions.   She seems to enjoy coming to class and  is very quiet, not very talkative.  She continues to have no psychosocial issues identified.  Patient continues to demonstrate a positvie outlook.   We will encourage more interaction with staff and class.   We will continue to monitor as she progresses in the program.    Expected Outcomes Patient will have no psychosocial issuses identified at discharge.    Interventions Stress management education;Relaxation education;Encouraged to attend Pulmonary Rehabilitation for the exercise    Continue Psychosocial Services  No Follow up required              Education: Education Goals: Education classes will be provided on a weekly basis, covering required topics. Participant will state understanding/return demonstration of topics presented.  Learning Barriers/Preferences:  Learning  Barriers/Preferences - 09/17/22 1347       Learning Barriers/Preferences   Learning Barriers None    Learning Preferences Written Material             Education Topics: How Lungs Work and Diseases: - Discuss the anatomy of the lungs and diseases that can affect the lungs, such as COPD.   Exercise: -Discuss the importance of exercise, FITT principles of exercise, normal and abnormal responses to exercise, and how to exercise safely.   Environmental Irritants: -Discuss types of environmental irritants and how to limit exposure to environmental irritants.   Meds/Inhalers and oxygen: - Discuss respiratory medications, definition of an inhaler and oxygen, and the proper way to use an inhaler and oxygen.   Energy Saving Techniques: - Discuss methods to conserve energy and decrease shortness of breath when performing activities of daily living.  Flowsheet Row PULMONARY REHAB OTHER RESPIRATORY from 11/13/2022 in [facility]  Date 09/25/22  Educator HB  Instruction Review Code 1- Verbalizes Understanding       Bronchial Hygiene / Breathing Techniques: - Discuss breathing mechanics, pursed-lip breathing technique,  proper posture, effective ways to clear airways, and other functional breathing techniques Flowsheet Row PULMONARY REHAB OTHER RESPIRATORY from 11/13/2022 in [facility]  Date 10/02/22  Educator DF  Instruction Review Code 1- Verbalizes Understanding       Cleaning Equipment: - Provides group verbal and written instruction about the health risks of elevated stress, cause of high stress, and healthy ways to reduce stress. Flowsheet Row PULMONARY REHAB OTHER RESPIRATORY from 11/13/2022 in [facility]  Date 10/09/22  Educator HB  Instruction Review Code 1- Verbalizes Understanding       Nutrition I: Fats: - Discuss the types of cholesterol, what cholesterol does to the body, and how cholesterol  levels can be controlled. Flowsheet Row PULMONARY REHAB OTHER RESPIRATORY from 11/13/2022 in [facility]  Date 10/16/22  Educator handout       Nutrition II: Labels: -Discuss the different components of food labels and how to read food labels. Flowsheet Row PULMONARY REHAB OTHER RESPIRATORY from 11/13/2022 in [facility]  Date 10/23/22  Educator DF  Instruction Review Code 1- Verbalizes Understanding       Respiratory Infections: - Discuss the signs and symptoms of respiratory infections, ways to prevent respiratory infections, and the importance of seeking medical treatment when having a respiratory infection. Flowsheet Row PULMONARY REHAB OTHER RESPIRATORY from 11/13/2022 in [facility]  Date 11/06/22  Educator HB  Instruction Review Code 1- Verbalizes Understanding       Stress I: Signs and Symptoms: - Discuss the causes of stress, how stress may lead to anxiety and depression, and ways to limit stress. Flowsheet Row PULMONARY REHAB OTHER RESPIRATORY from 11/13/2022 in [facility]  Date 11/13/22  Educator Handout  Instruction Review Code 1- Verbalizes Understanding       Stress II: Relaxation: -Discuss relaxation techniques to limit stress.   Oxygen for Home/Travel: - Discuss how to prepare for travel when on oxygen and proper ways to transport and store oxygen to ensure safety.   Knowledge Questionnaire Score:  Knowledge Questionnaire Score - 09/17/22 1347  Knowledge Questionnaire Score   Pre Score 16/18             Core Components/Risk Factors/Patient Goals at Admission:  Personal Goals and Risk Factors at Admission - 09/17/22 1348       Core Components/Risk Factors/Patient Goals on Admission    Weight Management Obesity;Weight Maintenance    Improve shortness of breath with ADL's Yes    Intervention Provide education, individualized exercise plan and daily  activity instruction to help decrease symptoms of SOB with activities of daily living.    Expected Outcomes Short Term: Improve cardiorespiratory fitness to achieve a reduction of symptoms when performing ADLs;Long Term: Be able to perform more ADLs without symptoms or delay the onset of symptoms    Diabetes Yes    Intervention Provide education about signs/symptoms and action to take for hypo/hyperglycemia.;Provide education about proper nutrition, including hydration, and aerobic/resistive exercise prescription along with prescribed medications to achieve blood glucose in normal ranges: Fasting glucose 65-99 mg/dL    Expected Outcomes Short Term: Participant verbalizes understanding of the signs/symptoms and immediate care of hyper/hypoglycemia, proper foot care and importance of medication, aerobic/resistive exercise and nutrition plan for blood glucose control.;Long Term: Attainment of HbA1C < 7%.    Heart Failure Yes    Intervention Provide a combined exercise and nutrition program that is supplemented with education, support and counseling about heart failure. Directed toward relieving symptoms such as shortness of breath, decreased exercise tolerance, and extremity edema.    Expected Outcomes Short term: Attendance in program 2-3 days a week with increased exercise capacity. Reported lower sodium intake. Reported increased fruit and vegetable intake. Reports medication compliance.    Personal Goal Other Yes    Personal Goal Patient wants to improve her stamina and to be able to walk up stairs without SOB.    Intervention Patient will attend PR 2 days/week with exercise and education.    Expected Outcomes Patient will complete the program.             Core Components/Risk Factors/Patient Goals Review:   Goals and Risk Factor Review     Row Name 09/24/22 1424 10/20/22 1121 11/18/22 1502         Core Components/Risk Factors/Patient Goals Review   Personal Goals Review Other;Weight  Management/Obesity;Diabetes Other;Weight Management/Obesity;Diabetes Other;Weight Management/Obesity;Diabetes     Review Pt is new to the program, and she was referred to PR due to dyspne on exertion.  Her current weight is 116.7 Kg.  She is exercising on RA with 02 sats from 95-97%.  Her goals are to improve her stamina and be able to walk up stairs.  We will continue to monitor her progress as she works toward meeting her goals. Patient has completed 8 sessions .  Her personal goal is to loss weight and current weight is 117.6 Kg.  We will encouage weight loss with decussion and hand outs.  Patient has Blood sugar levels at 180-243mg /dl prior to class.  She is exercising on RA with 02 sats from 96%-98%.  Her goals are to improve her stamina and be able to walk up stairs.  We will continue to monitor her progress as she works toward meeting her goals. Patient has completed 16sessions .  Her personal goal is to loss weight and current weight is 117.6 Kg, remains about the same.   We will encouage weight loss with decussion and hand outs.  She is exercising on RA no supplemental  oxygen needed at this time, with 02  sats from 93%-98%.  We  will help with developing efficient breathing techniques such as purse lipped breathing and practice self pacing with increased activity.   Her goals are to improve her stamina and be able to walk up stairs.  We will continue to monitor her progress as she works toward meeting her goals.     Expected Outcomes Pt will complete the program and meet her personal goals as well as the program goals. Pt will complete the program and meet her personal goals as well as the program goals. Pt will complete the program and meet her personal goals as well as the program goals.              Core Components/Risk Factors/Patient Goals at Discharge (Final Review):   Goals and Risk Factor Review - 11/18/22 1502       Core Components/Risk Factors/Patient Goals Review   Personal Goals  Review Other;Weight Management/Obesity;Diabetes    Review Patient has completed 16sessions .  Her personal goal is to loss weight and current weight is 117.6 Kg, remains about the same.   We will encouage weight loss with decussion and hand outs.  She is exercising on RA no supplemental  oxygen needed at this time, with 02 sats from 93%-98%.  We  will help with developing efficient breathing techniques such as purse lipped breathing and practice self pacing with increased activity.   Her goals are to improve her stamina and be able to walk up stairs.  We will continue to monitor her progress as she works toward meeting her goals.    Expected Outcomes Pt will complete the program and meet her personal goals as well as the program goals.             ITP Comments:   Comments: Marland KitchenMarland KitchenITP REVIEW Pt is making expected progress toward pulmonary rehab goals after completing 18 sessions. Recommend continued exercise, life style modification, education, and utilization of breathing techniques to increase stamina and strength and decrease shortness of breath with exertion.

## 2022-11-27 ENCOUNTER — Encounter (HOSPITAL_COMMUNITY): Payer: Medicare PPO

## 2022-12-02 ENCOUNTER — Encounter (HOSPITAL_COMMUNITY)
Admission: RE | Admit: 2022-12-02 | Discharge: 2022-12-02 | Disposition: A | Discharge status: Home / Self Care | Payer: Medicare PPO | Source: Ambulatory Visit | Attending: Internal Medicine | Admitting: Internal Medicine

## 2022-12-02 DIAGNOSIS — R0609 Other forms of dyspnea: Secondary | ICD-10-CM | POA: Diagnosis present

## 2022-12-02 NOTE — Progress Notes (Signed)
Daily Session Note  Patient Details  Name: Julie Bean MRN: [id number] Date of Birth: [birth year] Referring Provider:   Flowsheet Row CARDIAC REHAB PHASE II ORIENTATION from 09/17/2022 in [facility]  Referring Provider Dr. Guy Begin       Encounter Date: 12/02/2022  Check In:  Session Check In - 12/02/22 1330       Check-In   Supervising physician immediately available to respond to emergencies [facility] MD immediately available    Physician(s) Dr Dellia Cloud    Location [facility]    Staff Present Leana Roe, BS, Exercise Physiologist;Phyllis Billingsley, RN;Metzli Pollick Hassell Done, RN, BSN    Virtual Visit No    Medication changes reported     No    Fall or balance concerns reported    Yes    Comments Patient reports poor balance and has a history of falls.    Tobacco Cessation No Change    Warm-up and Cool-down Performed as group-led instruction    Resistance Training Performed Yes    VAD Patient? No    PAD/SET Patient? No      Pain Assessment   Currently in Pain? Yes    Pain Score 4     Pain Location Shoulder    Pain Orientation Left    Pain Descriptors / Indicators Aching    Pain Type Chronic pain    Multiple Pain Sites Yes      2nd Pain Site   Pain Score 3    Pain Type Chronic pain    Pain Location Shoulder    Pain Orientation Right    Pain Descriptors / Indicators Aching             Capillary Blood Glucose: No results found for this or any previous visit (from the past 24 hour(s)).    Social History   Tobacco Use  Smoking Status Never  Smokeless Tobacco Never  Tobacco Comments   Both parents smoked    Goals Met:  Proper associated with RPD/PD & O2 Sat Independence with exercise equipment Achieving weight loss Exercise tolerated well Queuing for purse lip breathing No report of concerns or symptoms today Strength training completed today  Goals Unmet:  Not Applicable  Comments: Checkout at  1430.   Dr. Kathie Dike is Medical Director for [facility].

## 2022-12-04 ENCOUNTER — Encounter (HOSPITAL_COMMUNITY)
Admission: RE | Admit: 2022-12-04 | Discharge: 2022-12-04 | Disposition: A | Discharge status: Home / Self Care | Payer: Medicare PPO | Source: Ambulatory Visit | Attending: Internal Medicine | Admitting: Internal Medicine

## 2022-12-04 DIAGNOSIS — R0609 Other forms of dyspnea: Secondary | ICD-10-CM

## 2022-12-04 NOTE — Progress Notes (Signed)
Daily Session Note  Patient Details  Name: Concetta Dangelo MRN: [id number] Date of Birth: 24-Sep-1950 Referring Provider:   Flowsheet Row CARDIAC REHAB PHASE II ORIENTATION from 09/17/2022 in [place]  Referring Provider Dr. Guy Begin       Encounter Date: 12/04/2022  Check In:  Session Check In - 12/04/22 1330       Check-In   Supervising physician immediately available to respond to emergencies [facility] MD immediately available    Physician(s) Dr Dellia Cloud    Location [facility]    Staff Present Leana Roe, BS, Exercise Physiologist;Audra Bellard Hassell Done, RN, BSN;Dalton Kris Mouton MHA, MS, ACSM-CEP    Virtual Visit No    Medication changes reported     No    Fall or balance concerns reported    Yes    Comments Patient reports poor balance and has a history of falls.    Tobacco Cessation No Change    Warm-up and Cool-down Performed as group-led instruction    Resistance Training Performed Yes      Pain Assessment   Currently in Pain? Yes    Pain Score 4     Pain Location Shoulder    Pain Orientation Left    Pain Descriptors / Indicators Aching    Pain Type Chronic pain    Multiple Pain Sites Yes      2nd Pain Site   Pain Score 3    Pain Type Chronic pain    Pain Location Shoulder    Pain Orientation Right             Capillary Blood Glucose: No results found for this or any previous visit (from the past 24 hour(s)).    Social History   Tobacco Use  Smoking Status Never  Smokeless Tobacco Never  Tobacco Comments   Both parents smoked    Goals Met:  Proper associated with RPD/PD & O2 Sat Independence with exercise equipment Using PLB without cueing & demonstrates good technique Exercise tolerated well Queuing for purse lip breathing No report of concerns or symptoms today Strength training completed today  Goals Unmet:  Not Applicable  Comments: Checkout at 1430.   Dr. Kathie Dike is Medical Director  for [facility].

## 2022-12-09 ENCOUNTER — Encounter (HOSPITAL_COMMUNITY)
Admission: RE | Admit: 2022-12-09 | Discharge: 2022-12-09 | Disposition: A | Discharge status: Home / Self Care | Payer: Medicare PPO | Source: Ambulatory Visit | Attending: Internal Medicine | Admitting: Internal Medicine

## 2022-12-09 VITALS — Wt 254.0 lb

## 2022-12-09 DIAGNOSIS — R0609 Other forms of dyspnea: Secondary | ICD-10-CM | POA: Diagnosis not present

## 2022-12-09 NOTE — Progress Notes (Signed)
Daily Session Note  Patient Details  Name: Julie Bean MRN: [id number] Date of Birth: [birth year] Referring Provider:   Flowsheet Row CARDIAC REHAB PHASE II ORIENTATION from 09/17/2022 in [facility] C[facility]AC RE[facility]IL[facility]  Referring Provider Dr. Ronnie Doss       Encounter Date: 12/09/2022  Check In:  Session Check In - 12/09/22 1330       Check-In   Supervising physician immediately available to respond to emergencies [facility] MD immediately available    Physician(s) Dr. Wyline Mood    Location [facility]    Staff Present Ross Ludwig, BS, Exercise Physiologist;Isreal Moline, RN;Daphyne Daphine Deutscher, RN, BSN;Dalton Fletcher MHA, MS, ACSM-CEP    Virtual Visit No    Medication changes reported     No    Fall or balance concerns reported    Yes    Comments Patient reports poor balance and has a history of falls.    Tobacco Cessation No Change    Warm-up and Cool-down Performed as group-led instruction    Resistance Training Performed Yes    VAD Patient? No    PAD/SET Patient? No      Pain Assessment   Currently in Pain? No/denies    Pain Score 4     Pain Location Shoulder    Pain Orientation Left    Pain Descriptors / Indicators Aching    Pain Type Chronic pain    Pain Onset More than a month ago    Pain Frequency Constant    Multiple Pain Sites Yes      2nd Pain Site   Pain Score 5    Pain Type Chronic pain    Pain Location Shoulder    Pain Orientation Right    Pain Descriptors / Indicators Aching    Pain Frequency Constant      Pain   Pain Onset More than a month ago             Capillary Blood Glucose: No results found for this or any previous visit (from the past 24 hour(s)).    Social History   Tobacco Use  Smoking Status Never  Smokeless Tobacco Never  Tobacco Comments   Both parents smoked    Goals Met:  Proper associated with RPD/PD & O2 Sat Independence with exercise equipment Exercise tolerated well No report of  concerns or symptoms today Strength training completed today  Goals Unmet:  Not Applicable  Comments: check out @ 2:30pm   Dr. Erick Blinks is Medical Director for [facility] Re[facility].

## 2022-12-11 ENCOUNTER — Encounter (HOSPITAL_COMMUNITY)
Admission: RE | Admit: 2022-12-11 | Discharge: 2022-12-11 | Disposition: A | Discharge status: Home / Self Care | Payer: Medicare PPO | Source: Ambulatory Visit | Attending: Internal Medicine | Admitting: Internal Medicine

## 2022-12-11 DIAGNOSIS — R0609 Other forms of dyspnea: Secondary | ICD-10-CM

## 2022-12-11 NOTE — Progress Notes (Signed)
Daily Session Note  Patient Details  Name: Julie Bean MRN: [id number] Date of Birth: Sep 14, 1950 Referring Provider:   Flowsheet Row CARDIAC REHAB PHASE II ORIENTATION from 09/17/2022 in [facility] CARDIAC REHABILITATION  Referring Provider Dr. Ronnie Doss       Encounter Date: 12/11/2022  Check In:  Session Check In - 12/11/22 1335       Check-In   Supervising physician immediately available to respond to emergencies --    Physician(s) --    Location --    Staff Present --    Virtual Visit --    Medication changes reported     --    Fall or balance concerns reported    --    Comments --    Tobacco Cessation --    Warm-up and Cool-down --    Resistance Training Performed --      Pain Assessment   Currently in Pain? --    Pain Score --    Pain Location --    Pain Orientation --    Pain Descriptors / Indicators --      2nd Pain Site   Pain Score --    Pain Location --    Pain Orientation --    Pain Descriptors / Indicators --             Capillary Blood Glucose: No results found for this or any previous visit (from the past 24 hour(s)).    Social History   Tobacco Use  Smoking Status Never  Smokeless Tobacco Never  Tobacco Comments   Both parents smoked    Goals Met:  Proper associated with RPD/PD & O2 Sat Independence with exercise equipment Using PLB without cueing & demonstrates good technique Exercise tolerated well Queuing for purse lip breathing No report of concerns or symptoms today Strength training completed today  Goals Unmet:  Not Applicable  Comments: Checkout at 1430.   Dr. Erick Blinks is Medical Director for [facility] Pul[facility] Rehab.

## 2022-12-15 ENCOUNTER — Other Ambulatory Visit: Payer: Self-pay

## 2022-12-15 ENCOUNTER — Encounter: Payer: Self-pay | Admitting: Internal Medicine

## 2022-12-15 DIAGNOSIS — E1165 Type 2 diabetes mellitus with hyperglycemia: Secondary | ICD-10-CM

## 2022-12-15 DIAGNOSIS — E039 Hypothyroidism, unspecified: Secondary | ICD-10-CM

## 2022-12-15 MED ORDER — GLIPIZIDE ER 10 MG PO TB24: ORAL_TABLET | ORAL | 0 refills | Status: DC

## 2022-12-15 MED ORDER — LEVOTHYROXINE SODIUM 150 MCG PO TABS: 150.0000 ug | ORAL_TABLET | ORAL | 2 refills | Status: DC

## 2022-12-16 ENCOUNTER — Encounter (HOSPITAL_COMMUNITY)
Admission: RE | Admit: 2022-12-16 | Discharge: 2022-12-16 | Disposition: A | Discharge status: Home / Self Care | Payer: Medicare PPO | Source: Ambulatory Visit | Attending: Internal Medicine | Admitting: Internal Medicine

## 2022-12-16 DIAGNOSIS — R0609 Other forms of dyspnea: Secondary | ICD-10-CM | POA: Diagnosis not present

## 2022-12-16 NOTE — Progress Notes (Signed)
Daily Session Note  Patient Details  Name: Julie Bean MRN: [id number] Date of Birth: [birth year] Referring Provider:   Flowsheet Row CARDIAC REHAB PHASE II ORIENTATION from 09/17/2022 in [facility] CARDIAC REHABILITATION  Referring Provider Dr. Ronnie Doss       Encounter Date: 12/16/2022  Check In:  Session Check In - 12/16/22 1344       Check-In   Supervising physician immediately available to respond to emergencies [facility] MD immediately available    Physician(s) Dr Diona Browner    Location [facility]    Staff Present Ross Ludwig, BS, Exercise Physiologist;Tiegan Terpstra Daphine Deutscher, RN, BSN;Phyllis Billingsley, RN    Virtual Visit No    Medication changes reported     No    Fall or balance concerns reported    Yes    Comments Patient reports poor balance and has a history of falls.    Tobacco Cessation No Change    Warm-up and Cool-down Performed as group-led instruction    Resistance Training Performed Yes      Pain Assessment   Currently in Pain? No/denies    Pain Score 5     Pain Location Shoulder    Pain Orientation Left    Pain Descriptors / Indicators Aching      2nd Pain Site   Pain Score 4    Pain Type Chronic pain    Pain Location Shoulder    Pain Orientation Left    Pain Descriptors / Indicators Aching             Capillary Blood Glucose: No results found for this or any previous visit (from the past 24 hour(s)).    Social History   Tobacco Use  Smoking Status Never  Smokeless Tobacco Never  Tobacco Comments   Both parents smoked    Goals Met:  Independence with exercise equipment Exercise tolerated well Queuing for purse lip breathing No report of concerns or symptoms today Strength training completed today  Goals Unmet:  Not Applicable  Comments: Checkout at 1430.   Dr. Erick Blinks is Medical Director for [facility] Pulmonary Rehab.

## 2022-12-17 ENCOUNTER — Other Ambulatory Visit: Payer: Self-pay

## 2022-12-17 DIAGNOSIS — E039 Hypothyroidism, unspecified: Secondary | ICD-10-CM

## 2022-12-17 NOTE — Addendum Note (Signed)
Addended by: Telford Nab on: 12/17/2022 11:25 AM   Modules accepted: Orders

## 2022-12-18 ENCOUNTER — Encounter (HOSPITAL_COMMUNITY)
Admission: RE | Admit: 2022-12-18 | Discharge: 2022-12-18 | Disposition: A | Discharge status: Home / Self Care | Payer: Medicare PPO | Source: Ambulatory Visit | Attending: Internal Medicine | Admitting: Internal Medicine

## 2022-12-18 DIAGNOSIS — R0609 Other forms of dyspnea: Secondary | ICD-10-CM

## 2022-12-18 NOTE — Progress Notes (Signed)
Daily Session Note  Patient Details  Name: Julie Bean MRN: [id number] Date of Birth: [birth year] Referring Provider:   Flowsheet Row CARDIAC REHAB PHASE II ORIENTATION from 09/17/2022 in [facility] CARDIAC REHABILITATION  Referring Provider Dr. Ronnie Doss       Encounter Date: 12/18/2022  Check In:  Session Check In - 12/18/22 1330       Check-In   Supervising physician immediately available to respond to emergencies [facility] MD immediately available    Physician(s) Dr Diona Browner    Location [facility]    Staff Present Ross Ludwig, BS, Exercise Physiologist;Khandi Kernes BSN, RN;Daphyne Daphine Deutscher, RN, BSN    Virtual Visit No    Medication changes reported     No    Fall or balance concerns reported    Yes    Comments Patient reports poor balance and has a history of falls.    Tobacco Cessation No Change    Warm-up and Cool-down Performed as group-led instruction    Resistance Training Performed Yes    VAD Patient? No    PAD/SET Patient? No      Pain Assessment   Currently in Pain? Yes    Pain Score 5     Pain Location Shoulder    Pain Orientation Left    Pain Descriptors / Indicators Aching    Pain Type Chronic pain    Pain Onset More than a month ago    Pain Frequency Constant    Multiple Pain Sites Yes      2nd Pain Site   Pain Score 4    Pain Type Chronic pain    Pain Location Shoulder    Pain Orientation Right    Pain Descriptors / Indicators Aching    Pain Frequency Constant      Pain   Pain Onset More than a month ago             Capillary Blood Glucose: No results found for this or any previous visit (from the past 24 hour(s)).    Social History   Tobacco Use  Smoking Status Never  Smokeless Tobacco Never  Tobacco Comments   Both parents smoked    Goals Met:  Proper associated with RPD/PD & O2 Sat Independence with exercise equipment Using PLB without cueing & demonstrates good technique Exercise tolerated  well Queuing for purse lip breathing No report of concerns or symptoms today Strength training completed today  Goals Unmet:  Not Applicable  Comments: check out at 14:30   Dr. Erick Blinks is Medical Director for [facility] Pul[facility] Rehab.

## 2022-12-19 ENCOUNTER — Other Ambulatory Visit: Payer: Self-pay | Admitting: Internal Medicine

## 2022-12-19 DIAGNOSIS — E039 Hypothyroidism, unspecified: Secondary | ICD-10-CM

## 2022-12-22 ENCOUNTER — Telehealth: Payer: Self-pay | Admitting: Internal Medicine

## 2022-12-22 ENCOUNTER — Other Ambulatory Visit: Payer: Self-pay

## 2022-12-22 DIAGNOSIS — E039 Hypothyroidism, unspecified: Secondary | ICD-10-CM

## 2022-12-22 MED ORDER — LEVOTHYROXINE SODIUM 150 MCG PO TABS: 150.0000 ug | ORAL_TABLET | ORAL | 2 refills | Status: DC

## 2022-12-22 NOTE — Telephone Encounter (Signed)
Pt called for refills levothyroxine (SYNTHROID) 150 MCG tablet [[id number]]  she said it was sent to the wrong pharmacy. [facility] is where the prescription needs to be sent.

## 2022-12-22 NOTE — Telephone Encounter (Signed)
Sent to [place] pharmacy ?

## 2022-12-23 ENCOUNTER — Encounter (HOSPITAL_COMMUNITY): Payer: Medicare PPO

## 2022-12-25 ENCOUNTER — Encounter (HOSPITAL_COMMUNITY)
Admission: RE | Admit: 2022-12-25 | Discharge: 2022-12-25 | Disposition: A | Discharge status: Home / Self Care | Payer: Medicare PPO | Source: Ambulatory Visit | Attending: Internal Medicine | Admitting: Internal Medicine

## 2022-12-25 ENCOUNTER — Telehealth: Payer: Self-pay | Admitting: Internal Medicine

## 2022-12-25 ENCOUNTER — Other Ambulatory Visit: Payer: Self-pay

## 2022-12-25 DIAGNOSIS — R0609 Other forms of dyspnea: Secondary | ICD-10-CM

## 2022-12-25 DIAGNOSIS — E039 Hypothyroidism, unspecified: Secondary | ICD-10-CM

## 2022-12-25 LAB — HEMOGLOBIN A1C
Est. average glucose Bld gHb Est-mCnc: 189 mg/dL
Hgb A1c MFr Bld: 8.2 % — ABNORMAL HIGH (ref 4.8–5.6)

## 2022-12-25 LAB — TSH+FREE T4
Free T4: 1.69 ng/dL (ref 0.82–1.77)
TSH: 4.44 u[IU]/mL (ref 0.450–4.500)

## 2022-12-25 MED ORDER — LEVOTHYROXINE SODIUM 175 MCG PO TABS
175.0000 ug | ORAL_TABLET | ORAL | 2 refills | Status: DC
Start: 2022-12-25 — End: 2023-02-11

## 2022-12-25 MED ORDER — LEVOTHYROXINE SODIUM 150 MCG PO TABS
150.0000 ug | ORAL_TABLET | ORAL | 2 refills | Status: DC
Start: 2022-12-25 — End: 2023-02-11

## 2022-12-25 NOTE — Progress Notes (Signed)
Pulmonary Individual Treatment Plan  Patient Details  Name: Julie Bean MRN: [id number] Date of Birth: 11/28/1950 Referring Provider:   Flowsheet Row CARDIAC REHAB PHASE II ORIENTATION from 09/17/2022 in [facility] CARDIAC REHABILITATION  Referring Provider Dr. Ronnie Doss       Initial Encounter Date:  Flowsheet Row CARDIAC REHAB PHASE II ORIENTATION from 09/17/2022 in [place] CARDIAC REHABILITATION  Date 09/17/22       Visit Diagnosis: DOE (dyspnea on exertion)  Patient's Home Medications on Admission:   Current Outpatient Medications:    acetaminophen (TYLENOL) 500 MG tablet, Take 1,000 mg by mouth every 8 (eight) hours as needed for moderate pain., Disp: , Rfl:    amLODipine (NORVASC) 5 MG tablet, Take 1 tablet (5 mg total) by mouth daily., Disp: 90 tablet, Rfl: 3   dapagliflozin propanediol (FARXIGA) 10 MG TABS tablet, Take 1 tablet (10 mg total) by mouth daily before breakfast., Disp: 90 tablet, Rfl: 3   diclofenac Sodium (VOLTAREN) 1 % GEL, SMARTSIG:2 Gram(s) Topical 3 Times Daily PRN, Disp: , Rfl:    divalproex (DEPAKOTE) 500 MG DR tablet, Take 500 mg by mouth 3 (three) times daily., Disp: , Rfl:    glipiZIDE (GLUCOTROL XL) 10 MG 24 hr tablet, TAKE ONE TABLET (10 MG TOTAL) BY MOUTH DAILY WITH BREAKFAST., Disp: 30 tablet, Rfl: 0   glucose blood (CONTOUR TEST) test strip, Use as instructed once daily testing dx e11.9, Disp: 100 each, Rfl: 5   indomethacin (INDOCIN) 50 MG capsule, Take 1 capsule (50 mg total) by mouth 2 (two) times daily with a meal., Disp: 180 capsule, Rfl: 3   levothyroxine (SYNTHROID) 150 MCG tablet, Take 1 tablet (150 mcg total) by mouth every other day., Disp: 15 tablet, Rfl: 2   levothyroxine (SYNTHROID) 175 MCG tablet, Take 1 tablet (175 mcg total) by mouth every other day., Disp: 15 tablet, Rfl: 2   metFORMIN (GLUCOPHAGE) 500 MG tablet, Take 1 tablet (500 mg total) by mouth 3 (three) times daily., Disp: 270 tablet, Rfl: 3   metoprolol succinate  (TOPROL-XL) 25 MG 24 hr tablet, Take 1 tablet (25 mg total) by mouth daily., Disp: 90 tablet, Rfl: 3   Multiple Vitamins-Minerals (PRESERVISION AREDS 2+MULTI VIT PO), Take 1 tablet by mouth 2 (two) times daily., Disp: , Rfl:    omeprazole (PRILOSEC) 40 MG capsule, Take 1 capsule (40 mg total) by mouth daily., Disp: 90 capsule, Rfl: 3   rosuvastatin (CRESTOR) 10 MG tablet, Take 1 tablet (10 mg total) by mouth daily., Disp: 90 tablet, Rfl: 3   torsemide (DEMADEX) 20 MG tablet, TAKE TWO TABLETS (40MG  TOTAL) BY MOUTH DAILY, Disp: 180 tablet, Rfl: 2  Past Medical History: Past Medical History:  Diagnosis Date   Arthritis    knees, shoulder, neck and hip   Cataract    [employer] 09/18/2020   CHF (congestive heart failure) (HCC)    Chronic kidney disease    Closed fracture of left proximal humerus 04/07/2018   Diabetes mellitus without complication (HCC)    Type II   DISH (diffuse idiopathic skeletal hyperostosis)    Encounter for well adult exam with abnormal findings 08/10/2022   GERD (gastroesophageal reflux disease)    Headache, hemicrania continua    Hyperlipidemia    [employer] 09/18/2020   Hypertension    Hypothyroidism    Macular degeneration    Macular degeneration    followed by Dr. Sherryll Burger in [place]   PONV (postoperative nausea and vomiting)    Proximal humerus fracture  Left   Thyroid disease    [employer] 09/18/2020    Tobacco Use: Social History   Tobacco Use  Smoking Status Never  Smokeless Tobacco Never  Tobacco Comments   Both parents smoked    Labs: Review Flowsheet  More data exists      Latest Ref Rng & Units 06/04/2021 03/12/2022 07/08/2022 07/31/2022 12/24/2022  Labs for ITP Cardiac and Pulmonary Rehab  Cholestrol 100 - 199 mg/dL 161  096  - 045  -  LDL (calc) 0 - 99 mg/dL 76  61  - 68  -  HDL-C >39 mg/dL 45  49  - 42  -  Trlycerides 0 - 149 mg/dL 409  811  - 914  -  Hemoglobin A1c 4.8 - 5.6 % 6.8  8.6  8.6  8.8  8.2     Capillary Blood Glucose: Lab  Results  Component Value Date   GLUCAP 208 (H) 09/30/2022   GLUCAP 243 (H) 09/25/2022   GLUCAP 239 (H) 09/23/2022   GLUCAP 228 (H) 09/17/2022   GLUCAP 163 (H) 06/28/2021     Pulmonary Assessment Scores:  Pulmonary Assessment Scores     Row Name 09/17/22 1351         ADL UCSD   ADL Phase Entry     SOB Score total 17     Rest 0     Walk 0     Stairs 4     Bath 0     Dress 0     Shop 1       CAT Score   CAT Score 6       mMRC Score   mMRC Score 2             UCSD: Self-administered rating of dyspnea associated with activities of daily living (ADLs) 6-point scale (0 = "not at all" to 5 = "maximal or unable to do because of breathlessness")  Scoring Scores range from 0 to 120.  Minimally important difference is 5 units  CAT: CAT can identify the health impairment of COPD patients and is better correlated with disease progression.  CAT has a scoring range of zero to 40. The CAT score is classified into four groups of low (less than 10), medium (10 - 20), high (21-30) and very high (31-40) based on the impact level of disease on health status. A CAT score over 10 suggests significant symptoms.  A worsening CAT score could be explained by an exacerbation, poor medication adherence, poor inhaler technique, or progression of COPD or comorbid conditions.  CAT MCID is 2 points  mMRC: mMRC (Modified Medical Research Council) Dyspnea Scale is used to assess the degree of baseline functional disability in patients of respiratory disease due to dyspnea. No minimal important difference is established. A decrease in score of 1 point or greater is considered a positive change.   Pulmonary Function Assessment:   Exercise Target Goals: Exercise Program Goal: Individual exercise prescription set using results from initial 6 min walk test and THRR while considering  patient's activity barriers and safety.   Exercise Prescription Goal: Initial exercise prescription builds to 30-45  minutes a day of aerobic activity, 2-3 days per week.  Home exercise guidelines will be given to patient during program as part of exercise prescription that the participant will acknowledge.  Activity Barriers & Risk Stratification:  Activity Barriers & Cardiac Risk Stratification - 09/17/22 1303       Activity Barriers & Cardiac Risk Stratification  Activity Barriers Back Problems;Neck/Spine Problems;Joint Problems;Shortness of Breath;Balance Concerns;History of Falls    Cardiac Risk Stratification High             6 Minute Walk:  6 Minute Walk     Row Name 09/17/22 1406         6 Minute Walk   Phase Initial     Distance 800 feet     Walk Time 6 minutes     # of Rest Breaks 0     MPH 1.51     METS 1.38     RPE 13     Perceived Dyspnea  11     VO2 Peak 4.85     Symptoms No     Comments none     Resting HR 80 bpm     Resting BP 100/56     Resting Oxygen Saturation  96 %     Exercise Oxygen Saturation  during 6 min walk 97 %     Max Ex. HR 115 bpm     Max Ex. BP 120/60     2 Minute Post BP 110/58       Interval HR   1 Minute HR 104     2 Minute HR 107     3 Minute HR 110     4 Minute HR 112     5 Minute HR 114     6 Minute HR 115     2 Minute Post HR 81     Interval Heart Rate? Yes       Interval Oxygen   Interval Oxygen? Yes     Baseline Oxygen Saturation % 96 %     1 Minute Oxygen Saturation % 97 %     1 Minute Liters of Oxygen 0 L     2 Minute Oxygen Saturation % 97 %     2 Minute Liters of Oxygen 0 L     3 Minute Oxygen Saturation % 97 %     3 Minute Liters of Oxygen 0 L     4 Minute Oxygen Saturation % 97 %     4 Minute Liters of Oxygen 0 L     5 Minute Oxygen Saturation % 98 %     5 Minute Liters of Oxygen 0 L     6 Minute Oxygen Saturation % 98 %     6 Minute Liters of Oxygen 0 L     2 Minute Post Oxygen Saturation % 98 %     2 Minute Post Liters of Oxygen 0 L              Oxygen Initial Assessment:  Oxygen Initial Assessment -  09/17/22 1351       Home Oxygen   Home Oxygen Device None    Sleep Oxygen Prescription None    Home Exercise Oxygen Prescription None    Home Resting Oxygen Prescription None      Initial 6 min Walk   Oxygen Used None      Program Oxygen Prescription   Program Oxygen Prescription None      Intervention   Short Term Goals To learn and understand importance of monitoring SPO2 with pulse oximeter and demonstrate accurate use of the pulse oximeter.;To learn and understand importance of maintaining oxygen saturations>88%;To learn and demonstrate proper pursed lip breathing techniques or other breathing techniques.     Long  Term Goals Verbalizes importance of monitoring SPO2 with pulse oximeter and  return demonstration;Maintenance of O2 saturations>88%;Exhibits proper breathing techniques, such as pursed lip breathing or other method taught during program session             Oxygen Re-Evaluation:  Oxygen Re-Evaluation     Row Name 09/30/22 1552 10/28/22 1551 11/25/22 1534 12/24/22 1339       Program Oxygen Prescription   Program Oxygen Prescription None None None None      Home Oxygen   Home Oxygen Device None None None None    Sleep Oxygen Prescription None None None None    Home Exercise Oxygen Prescription None None None None    Home Resting Oxygen Prescription None None None None    Compliance with Home Oxygen Use Yes Yes Yes Yes      Goals/Expected Outcomes   Short Term Goals To learn and understand importance of monitoring SPO2 with pulse oximeter and demonstrate accurate use of the pulse oximeter.;To learn and understand importance of maintaining oxygen saturations>88%;To learn and demonstrate proper pursed lip breathing techniques or other breathing techniques.  To learn and understand importance of monitoring SPO2 with pulse oximeter and demonstrate accurate use of the pulse oximeter.;To learn and understand importance of maintaining oxygen saturations>88%;To learn and  demonstrate proper pursed lip breathing techniques or other breathing techniques.  To learn and understand importance of monitoring SPO2 with pulse oximeter and demonstrate accurate use of the pulse oximeter.;To learn and understand importance of maintaining oxygen saturations>88%;To learn and demonstrate proper pursed lip breathing techniques or other breathing techniques.  To learn and understand importance of monitoring SPO2 with pulse oximeter and demonstrate accurate use of the pulse oximeter.;To learn and understand importance of maintaining oxygen saturations>88%;To learn and demonstrate proper pursed lip breathing techniques or other breathing techniques.     Long  Term Goals Verbalizes importance of monitoring SPO2 with pulse oximeter and return demonstration;Maintenance of O2 saturations>88%;Exhibits proper breathing techniques, such as pursed lip breathing or other method taught during program session Verbalizes importance of monitoring SPO2 with pulse oximeter and return demonstration;Maintenance of O2 saturations>88%;Exhibits proper breathing techniques, such as pursed lip breathing or other method taught during program session Verbalizes importance of monitoring SPO2 with pulse oximeter and return demonstration;Maintenance of O2 saturations>88%;Exhibits proper breathing techniques, such as pursed lip breathing or other method taught during program session Verbalizes importance of monitoring SPO2 with pulse oximeter and return demonstration;Maintenance of O2 saturations>88%;Exhibits proper breathing techniques, such as pursed lip breathing or other method taught during program session    Goals/Expected Outcomes compliance compliance compliance compliance             Oxygen Discharge (Final Oxygen Re-Evaluation):  Oxygen Re-Evaluation - 12/24/22 1339       Program Oxygen Prescription   Program Oxygen Prescription None      Home Oxygen   Home Oxygen Device None    Sleep Oxygen  Prescription None    Home Exercise Oxygen Prescription None    Home Resting Oxygen Prescription None    Compliance with Home Oxygen Use Yes      Goals/Expected Outcomes   Short Term Goals To learn and understand importance of monitoring SPO2 with pulse oximeter and demonstrate accurate use of the pulse oximeter.;To learn and understand importance of maintaining oxygen saturations>88%;To learn and demonstrate proper pursed lip breathing techniques or other breathing techniques.     Long  Term Goals Verbalizes importance of monitoring SPO2 with pulse oximeter and return demonstration;Maintenance of O2 saturations>88%;Exhibits proper breathing techniques, such as pursed lip breathing or  other method taught during program session    Goals/Expected Outcomes compliance             Initial Exercise Prescription:  Initial Exercise Prescription - 09/17/22 1400       Date of Initial Exercise RX and Referring Provider   Date 09/17/22    Referring Provider Dr. Ronnie Doss    Expected Discharge Date 01/20/23      NuStep   Level 1    SPM 60    Minutes 22      Arm Ergometer   Level 1    RPM 40    Minutes 17      Prescription Details   Frequency (times per week) 2    Duration Progress to 30 minutes of continuous aerobic without signs/symptoms of physical distress      Intensity   THRR 40-80% of Max Heartrate 60-120    Ratings of Perceived Exertion 11-13    Perceived Dyspnea 0-4      Resistance Training   Training Prescription Yes    Weight 3    Reps 10-15             Perform Capillary Blood Glucose checks as needed.  Exercise Prescription Changes:   Exercise Prescription Changes     Row Name 09/30/22 1500 10/14/22 1500 10/23/22 1500 11/11/22 1400 11/25/22 1500     Response to Exercise   Blood Pressure (Admit) 114/66 130/64 114/68 114/64 108/62   Blood Pressure (Exercise) 126/68 128/62 126/70 118/70 120/62   Blood Pressure (Exit) 120/62 108/56 116/60 112/60 112/62    Heart Rate (Admit) 82 bpm 77 bpm 83 bpm 77 bpm 92 bpm   Heart Rate (Exercise) 85 bpm 88 bpm 86 bpm 80 bpm 87 bpm   Heart Rate (Exit) 85 bpm 81 bpm 86 bpm 77 bpm 87 bpm   Oxygen Saturation (Admit) 96 % 97 % 96 % 98 % 94 %   Oxygen Saturation (Exercise) 97 % 96 % 96 % 96 % 94 %   Oxygen Saturation (Exit) 95 % 97 % 98 % 96 % 94 %   Rating of Perceived Exertion (Exercise) 12 12 12 12 12    Perceived Dyspnea (Exercise) 12 12 12 12 12    Duration Continue with 30 min of aerobic exercise without signs/symptoms of physical distress. Continue with 30 min of aerobic exercise without signs/symptoms of physical distress. Continue with 30 min of aerobic exercise without signs/symptoms of physical distress. Continue with 30 min of aerobic exercise without signs/symptoms of physical distress. Continue with 30 min of aerobic exercise without signs/symptoms of physical distress.   Intensity THRR unchanged THRR unchanged THRR unchanged THRR unchanged THRR unchanged     Progression   Progression Continue to progress workloads to maintain intensity without signs/symptoms of physical distress. Continue to progress workloads to maintain intensity without signs/symptoms of physical distress. Continue to progress workloads to maintain intensity without signs/symptoms of physical distress. Continue to progress workloads to maintain intensity without signs/symptoms of physical distress. Continue to progress workloads to maintain intensity without signs/symptoms of physical distress.     Resistance Training   Training Prescription Yes Yes Yes Yes Yes   Weight 2 3 3 2 2    Reps 10-15 10-15 10-15 10-15 10-15   Time 10 Minutes 10 Minutes 10 Minutes 10 Minutes 10 Minutes     NuStep   Level 1 3 3 3 4    SPM 69 77 92 95 100   Minutes 17 17 39 39 39  METs 1.7 1.8 1.8 1.8 1.9     Arm Ergometer   Level 1 1 -- -- --   RPM 21 26 -- -- --   Minutes 17 22 -- -- --   METs 1.4 1.3 -- -- --    Row Name 12/09/22 1400 12/18/22  1400           Response to Exercise   Blood Pressure (Admit) 112/74 118/70      Blood Pressure (Exercise) 130/74 106/58      Blood Pressure (Exit) 112/60 100/56      Heart Rate (Admit) 80 bpm 83 bpm      Heart Rate (Exercise) 80 bpm 80 bpm      Heart Rate (Exit) 78 bpm 79 bpm      Oxygen Saturation (Admit) 98 % 98 %      Oxygen Saturation (Exercise) 97 % 94 %      Oxygen Saturation (Exit) 96 % 96 %      Rating of Perceived Exertion (Exercise) 11 12      Perceived Dyspnea (Exercise) 12 12      Duration Continue with 30 min of aerobic exercise without signs/symptoms of physical distress. Continue with 30 min of aerobic exercise without signs/symptoms of physical distress.      Intensity THRR unchanged THRR unchanged        Progression   Progression Continue to progress workloads to maintain intensity without signs/symptoms of physical distress. Continue to progress workloads to maintain intensity without signs/symptoms of physical distress.        Resistance Training   Training Prescription Yes Yes      Weight 2 2      Reps 10-15 10-15      Time 10 Minutes 10 Minutes        NuStep   Level 4 4      SPM 68 94      Minutes 39 39      METs 1.7 1.8               Exercise Comments:   Exercise Goals and Review:   Exercise Goals     Row Name 09/17/22 1410 09/30/22 1554 10/28/22 1549 11/25/22 1529 12/24/22 1336     Exercise Goals   Increase Physical Activity Yes Yes Yes Yes Yes   Intervention Provide advice, education, support and counseling about physical activity/exercise needs.;Develop an individualized exercise prescription for aerobic and resistive training based on initial evaluation findings, risk stratification, comorbidities and participant's personal goals. Provide advice, education, support and counseling about physical activity/exercise needs.;Develop an individualized exercise prescription for aerobic and resistive training based on initial evaluation findings,  risk stratification, comorbidities and participant's personal goals. Provide advice, education, support and counseling about physical activity/exercise needs.;Develop an individualized exercise prescription for aerobic and resistive training based on initial evaluation findings, risk stratification, comorbidities and participant's personal goals. Provide advice, education, support and counseling about physical activity/exercise needs.;Develop an individualized exercise prescription for aerobic and resistive training based on initial evaluation findings, risk stratification, comorbidities and participant's personal goals. Provide advice, education, support and counseling about physical activity/exercise needs.;Develop an individualized exercise prescription for aerobic and resistive training based on initial evaluation findings, risk stratification, comorbidities and participant's personal goals.   Expected Outcomes Short Term: Attend rehab on a regular basis to increase amount of physical activity.;Long Term: Add in home exercise to make exercise part of routine and to increase amount of physical activity.;Long Term: Exercising regularly at least 3-5  days a week. Short Term: Attend rehab on a regular basis to increase amount of physical activity.;Long Term: Add in home exercise to make exercise part of routine and to increase amount of physical activity.;Long Term: Exercising regularly at least 3-5 days a week. Short Term: Attend rehab on a regular basis to increase amount of physical activity.;Long Term: Add in home exercise to make exercise part of routine and to increase amount of physical activity.;Long Term: Exercising regularly at least 3-5 days a week. Short Term: Attend rehab on a regular basis to increase amount of physical activity.;Long Term: Add in home exercise to make exercise part of routine and to increase amount of physical activity.;Long Term: Exercising regularly at least 3-5 days a week. Short  Term: Attend rehab on a regular basis to increase amount of physical activity.;Long Term: Add in home exercise to make exercise part of routine and to increase amount of physical activity.;Long Term: Exercising regularly at least 3-5 days a week.   Increase Strength and Stamina Yes Yes Yes Yes Yes   Intervention Provide advice, education, support and counseling about physical activity/exercise needs.;Develop an individualized exercise prescription for aerobic and resistive training based on initial evaluation findings, risk stratification, comorbidities and participant's personal goals. Provide advice, education, support and counseling about physical activity/exercise needs.;Develop an individualized exercise prescription for aerobic and resistive training based on initial evaluation findings, risk stratification, comorbidities and participant's personal goals. Provide advice, education, support and counseling about physical activity/exercise needs.;Develop an individualized exercise prescription for aerobic and resistive training based on initial evaluation findings, risk stratification, comorbidities and participant's personal goals. Provide advice, education, support and counseling about physical activity/exercise needs.;Develop an individualized exercise prescription for aerobic and resistive training based on initial evaluation findings, risk stratification, comorbidities and participant's personal goals. Provide advice, education, support and counseling about physical activity/exercise needs.;Develop an individualized exercise prescription for aerobic and resistive training based on initial evaluation findings, risk stratification, comorbidities and participant's personal goals.   Expected Outcomes Short Term: Increase workloads from initial exercise prescription for resistance, speed, and METs.;Short Term: Perform resistance training exercises routinely during rehab and add in resistance training at  home;Long Term: Improve cardiorespiratory fitness, muscular endurance and strength as measured by increased METs and functional capacity ( ) Short Term: Increase workloads from initial exercise prescription for resistance, speed, and METs.;Short Term: Perform resistance training exercises routinely during rehab and add in resistance training at home;Long Term: Improve cardiorespiratory fitness, muscular endurance and strength as measured by increased METs and functional capacity ( ) Short Term: Increase workloads from initial exercise prescription for resistance, speed, and METs.;Short Term: Perform resistance training exercises routinely during rehab and add in resistance training at home;Long Term: Improve cardiorespiratory fitness, muscular endurance and strength as measured by increased METs and functional capacity ( ) Short Term: Increase workloads from initial exercise prescription for resistance, speed, and METs.;Short Term: Perform resistance training exercises routinely during rehab and add in resistance training at home;Long Term: Improve cardiorespiratory fitness, muscular endurance and strength as measured by increased METs and functional capacity ( ) Short Term: Increase workloads from initial exercise prescription for resistance, speed, and METs.;Short Term: Perform resistance training exercises routinely during rehab and add in resistance training at home;Long Term: Improve cardiorespiratory fitness, muscular endurance and strength as measured by increased METs and functional capacity ( )   Able to understand and use rate of perceived exertion (RPE) scale Yes Yes Yes Yes Yes   Intervention Provide education and explanation on how to use RPE scale Provide education  and explanation on how to use RPE scale Provide education and explanation on how to use RPE scale Provide education and explanation on how to use RPE scale Provide education and explanation on how to use RPE scale    Expected Outcomes Short Term: Able to use RPE daily in rehab to express subjective intensity level;Long Term:  Able to use RPE to guide intensity level when exercising independently Short Term: Able to use RPE daily in rehab to express subjective intensity level;Long Term:  Able to use RPE to guide intensity level when exercising independently Short Term: Able to use RPE daily in rehab to express subjective intensity level;Long Term:  Able to use RPE to guide intensity level when exercising independently Short Term: Able to use RPE daily in rehab to express subjective intensity level;Long Term:  Able to use RPE to guide intensity level when exercising independently Short Term: Able to use RPE daily in rehab to express subjective intensity level;Long Term:  Able to use RPE to guide intensity level when exercising independently   Able to understand and use Dyspnea scale -- -- -- Yes Yes   Intervention -- -- -- Provide education and explanation on how to use Dyspnea scale Provide education and explanation on how to use Dyspnea scale   Expected Outcomes -- -- -- Short Term: Able to use Dyspnea scale daily in rehab to express subjective sense of shortness of breath during exertion;Long Term: Able to use Dyspnea scale to guide intensity level when exercising independently Short Term: Able to use Dyspnea scale daily in rehab to express subjective sense of shortness of breath during exertion;Long Term: Able to use Dyspnea scale to guide intensity level when exercising independently   Knowledge and understanding of Target Heart Rate Range (THRR) Yes Yes Yes Yes Yes   Intervention Provide education and explanation of THRR including how the numbers were predicted and where they are located for reference Provide education and explanation of THRR including how the numbers were predicted and where they are located for reference Provide education and explanation of THRR including how the numbers were predicted and where they  are located for reference Provide education and explanation of THRR including how the numbers were predicted and where they are located for reference Provide education and explanation of THRR including how the numbers were predicted and where they are located for reference   Expected Outcomes Short Term: Able to state/look up THRR;Long Term: Able to use THRR to govern intensity when exercising independently;Short Term: Able to use daily as guideline for intensity in rehab Short Term: Able to state/look up THRR;Long Term: Able to use THRR to govern intensity when exercising independently;Short Term: Able to use daily as guideline for intensity in rehab Short Term: Able to state/look up THRR;Long Term: Able to use THRR to govern intensity when exercising independently;Short Term: Able to use daily as guideline for intensity in rehab Short Term: Able to state/look up THRR;Long Term: Able to use THRR to govern intensity when exercising independently;Short Term: Able to use daily as guideline for intensity in rehab Short Term: Able to state/look up THRR;Long Term: Able to use THRR to govern intensity when exercising independently;Short Term: Able to use daily as guideline for intensity in rehab   Able to check pulse independently Yes Yes Yes Yes Yes   Intervention Provide education and demonstration on how to check pulse in carotid and radial arteries.;Review the importance of being able to check your own pulse for safety during independent exercise Provide  education and demonstration on how to check pulse in carotid and radial arteries.;Review the importance of being able to check your own pulse for safety during independent exercise Provide education and demonstration on how to check pulse in carotid and radial arteries.;Review the importance of being able to check your own pulse for safety during independent exercise Provide education and demonstration on how to check pulse in carotid and radial arteries.;Review  the importance of being able to check your own pulse for safety during independent exercise Provide education and demonstration on how to check pulse in carotid and radial arteries.;Review the importance of being able to check your own pulse for safety during independent exercise   Expected Outcomes Short Term: Able to explain why pulse checking is important during independent exercise;Long Term: Able to check pulse independently and accurately Short Term: Able to explain why pulse checking is important during independent exercise;Long Term: Able to check pulse independently and accurately Short Term: Able to explain why pulse checking is important during independent exercise;Long Term: Able to check pulse independently and accurately Short Term: Able to explain why pulse checking is important during independent exercise;Long Term: Able to check pulse independently and accurately Short Term: Able to explain why pulse checking is important during independent exercise;Long Term: Able to check pulse independently and accurately   Understanding of Exercise Prescription Yes Yes Yes Yes Yes   Intervention Provide education, explanation, and written materials on patient's individual exercise prescription Provide education, explanation, and written materials on patient's individual exercise prescription Provide education, explanation, and written materials on patient's individual exercise prescription Provide education, explanation, and written materials on patient's individual exercise prescription Provide education, explanation, and written materials on patient's individual exercise prescription   Expected Outcomes Short Term: Able to explain program exercise prescription;Long Term: Able to explain home exercise prescription to exercise independently Short Term: Able to explain program exercise prescription;Long Term: Able to explain home exercise prescription to exercise independently Short Term: Able to explain  program exercise prescription;Long Term: Able to explain home exercise prescription to exercise independently Short Term: Able to explain program exercise prescription;Long Term: Able to explain home exercise prescription to exercise independently Short Term: Able to explain program exercise prescription;Long Term: Able to explain home exercise prescription to exercise independently            Exercise Goals Re-Evaluation :  Exercise Goals Re-Evaluation     Row Name 09/30/22 1555 10/28/22 1549 11/25/22 1530 12/24/22 1336       Exercise Goal Re-Evaluation   Exercise Goals Review Increase Physical Activity;Increase Strength and Stamina;Able to understand and use rate of perceived exertion (RPE) scale;Able to understand and use Dyspnea scale;Knowledge and understanding of Target Heart Rate Range (THRR);Understanding of Exercise Prescription Increase Physical Activity;Increase Strength and Stamina;Able to understand and use rate of perceived exertion (RPE) scale;Knowledge and understanding of Target Heart Rate Range (THRR);Able to check pulse independently;Understanding of Exercise Prescription Increase Physical Activity;Increase Strength and Stamina;Able to understand and use rate of perceived exertion (RPE) scale;Able to understand and use Dyspnea scale;Knowledge and understanding of Target Heart Rate Range (THRR);Able to check pulse independently;Understanding of Exercise Prescription Increase Physical Activity;Increase Strength and Stamina;Able to understand and use rate of perceived exertion (RPE) scale;Knowledge and understanding of Target Heart Rate Range (THRR);Able to understand and use Dyspnea scale;Understanding of Exercise Prescription    Comments Pt has completed 4 sessions of PR. She is motivated to progress in the program. She is deconditioned, but she should be able to progress well.  She is currently exercising at 1.7 METs on the stepper. Will continue to monitor and progress as able. Pt  has completed 10 sessions of PR. She is motivated top be in the program and is progressing. SHe recently hurt her R bicep area and has been using the stepper for both sessions during class. She is currently exerciisng at 1.8 METs on the stepper. Will continue to monitor and progress as able, Pt has completed 17 sessions of PR. She tolerates exercising well and has increased her workload on the stepper. She continues to only use the stepper for both sessions due to RUE pain (2/10) and also complains of LUE pain (4/10). She does not seem as motivated during class compaired to the start of coming. She is currently exercising at 1.9 METs on the stepper. Will continue to monitor and progress as able. Pt has completed 23 sessions of PR. She is tolerating exercise despite having BUE pain in both shoulders. She is using the stepper but does not use the arms some days. She stated she was making an appt. with Ortho. about her shoulders. She does not seem to be benifiting from the program. She is currently exercising at 1.8 METs on the stepper. Will continue to monitor and progress as able.    Expected Outcomes Through exercise at rehab and at home, the patient will meet their stated goals. Through exercise at rehab and at home, the patient will meet their stated goals. Through exercise at rehab and at home, the patient will meet their stated goals. Through exercise at rehab and at home, the patient will meet their stated goals.             Discharge Exercise Prescription (Final Exercise Prescription Changes):  Exercise Prescription Changes - 12/18/22 1400       Response to Exercise   Blood Pressure (Admit) 118/70    Blood Pressure (Exercise) 106/58    Blood Pressure (Exit) 100/56    Heart Rate (Admit) 83 bpm    Heart Rate (Exercise) 80 bpm    Heart Rate (Exit) 79 bpm    Oxygen Saturation (Admit) 98 %    Oxygen Saturation (Exercise) 94 %    Oxygen Saturation (Exit) 96 %    Rating of Perceived Exertion  (Exercise) 12    Perceived Dyspnea (Exercise) 12    Duration Continue with 30 min of aerobic exercise without signs/symptoms of physical distress.    Intensity THRR unchanged      Progression   Progression Continue to progress workloads to maintain intensity without signs/symptoms of physical distress.      Resistance Training   Training Prescription Yes    Weight 2    Reps 10-15    Time 10 Minutes      NuStep   Level 4    SPM 94    Minutes 39    METs 1.8             Nutrition:  Target Goals: Understanding of nutrition guidelines, daily intake of sodium 1500mg , cholesterol 200mg , calories 30% from fat and 7% or less from saturated fats, daily to have 5 or more servings of fruits and vegetables.  Biometrics:  Pre Biometrics - 09/17/22 1410       Pre Biometrics   Height 5\' 8"  (1.727 m)    Weight 117.6 kg    Waist Circumference 52 inches    Hip Circumference 48 inches    Waist to Hip Ratio 1.08 %    BMI (Calculated)  39.43    Triceps Skinfold 40 mm    % Body Fat 52.6 %    Grip Strength 15 kg    Flexibility 0 in    Single Leg Stand 0 seconds              Nutrition Therapy Plan and Nutrition Goals:  Nutrition Therapy & Goals - 09/17/22 1344       Personal Nutrition Goals   Additional Goals? No    Comments Patient scored 36 on her diet assessment. She says she follows a low carb portion control diet similiar to the exchange DM diet. Handouts provided and explained regarding healthier choices and DM information. We offer 2 educational sessions on heart healthy nutrition with handouts and assistance with RD referral if patient is interested.      Intervention Plan   Intervention Nutrition handout(s) given to patient.    Expected Outcomes Short Term Goal: Understand basic principles of dietary content, such as calories, fat, sodium, cholesterol and nutrients.             Nutrition Assessments:  Nutrition Assessments - 09/17/22 1344       MEDFICTS  Scores   Pre Score 36            MEDIFICTS Score Key: ?70 Need to make dietary changes  40-70 Heart Healthy Diet ? 40 Therapeutic Level Cholesterol Diet   Picture Your Plate Scores: <16 Unhealthy dietary pattern with much room for improvement. 41-50 Dietary pattern unlikely to meet recommendations for good health and room for improvement. 51-60 More healthful dietary pattern, with some room for improvement.  >60 Healthy dietary pattern, although there may be some specific behaviors that could be improved.    Nutrition Goals Re-Evaluation:   Nutrition Goals Discharge (Final Nutrition Goals Re-Evaluation):   Psychosocial: Target Goals: Acknowledge presence or absence of significant depression and/or stress, maximize coping skills, provide positive support system. Participant is able to verbalize types and ability to use techniques and skills needed for reducing stress and depression.  Initial Review & Psychosocial Screening:  Initial Psych Review & Screening - 09/17/22 1352       Initial Review   Current issues with None Identified      Family Dynamics   Good Support System? Yes      Barriers   Psychosocial barriers to participate in program There are no identifiable barriers or psychosocial needs.      Screening Interventions   Interventions Encouraged to exercise;To provide support and resources with identified psychosocial needs    Expected Outcomes Short Term goal: Identification and review with participant of any Quality of Life or Depression concerns found by scoring the questionnaire.             Quality of Life Scores:  Quality of Life - 09/17/22 1413       Quality of Life   Select Quality of Life      Quality of Life Scores   Health/Function Pre 21.75 %    Socioeconomic Pre 26.43 %    Psych/Spiritual Pre 25.93 %    Family Pre 27.17 %    GLOBAL Pre 24.12 %            Scores of 19 and below usually indicate a poorer quality of life in these  areas.  A difference of  2-3 points is a clinically meaningful difference.  A difference of 2-3 points in the total score of the Quality of Life Index has been associated with significant  improvement in overall quality of life, self-image, physical symptoms, and general health in studies assessing change in quality of life.   PHQ-9: Review Flowsheet  More data exists      10/29/2022 10/07/2022 09/17/2022 09/09/2022 08/05/2022  Depression screen PHQ 2/9  Decreased Interest 0 0 0 0 0  Down, Depressed, Hopeless 0 0 0 0 0  PHQ - 2 Score 0 0 0 0 0  Altered sleeping 1 1 0 - 1  Tired, decreased energy 0 0 0 - 1  Change in appetite 1 1 0 - 1  Feeling bad or failure about yourself  0 0 0 - 0  Trouble concentrating 0 0 0 - 0  Moving slowly or fidgety/restless 0 0 0 - 0  Suicidal thoughts 0 0 0 - 0  PHQ-9 Score 2 2 0 - 3  Difficult doing work/chores - - Not difficult at all - -   Interpretation of Total Score  Total Score Depression Severity:  1-4 = Minimal depression, 5-9 = Mild depression, 10-14 = Moderate depression, 15-19 = Moderately severe depression, 20-27 = Severe depression   Psychosocial Evaluation and Intervention:  Psychosocial Evaluation - 09/17/22 1353       Psychosocial Evaluation & Interventions   Interventions Stress management education;Relaxation education;Encouraged to exercise with the program and follow exercise prescription    Comments Patient has no psychosocial barriers identified at her orientation visit. Her PHQ-9 score was 0. She is a retired [employer] serving for over 30 years. 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 She is originally from [place] so she moved back to [place] when she retired. She and her sister brought a house together after her sister's husband died. She says she has a good support system with her sister and her neices and nephews and  chruch family. She denies any depression or anxiety. She is a very pleasant 72 year old looking forward to participating in the program. She has stairs in her house and she wants to be able to climb them without SOB and improve her stamina.    Expected Outcomes Patient will continue to have no psychosocial barriers identified.    Continue Psychosocial Services  No Follow up required             Psychosocial Re-Evaluation:  Psychosocial Re-Evaluation     Row Name 09/24/22 1421 10/20/22 1114 11/18/22 1501 12/15/22 1456       Psychosocial Re-Evaluation   Current issues with None Identified None Identified -- None Identified    Comments -- Patient has completed 8 sessions.   She seems to enjoy coming to class and  is very quiet, not very talkative.  She continues to have no psychosocial issues identified.  Patient continues to demonstrate a positvie outlook.   We will encourage more interaction with staff and class.   We will continue to monitor as she progresses in the program. Patient has completed 16 sessions.   She seems to enjoy coming to class and  is very quiet, not very talkative.  She continues to have no psychosocial issues identified.  Patient continues to demonstrate a positvie outlook.   We will encourage more interaction with staff and class.   We will continue to monitor as she progresses in the program. Patient has completed 21 sessions.   She seems to enjoy coming to class and  is very quiet,  not very talkative.  She continues to have no psychosocial issues identified.  Patient continues to demonstrate a positvie outlook.   We will encourage more interaction with staff and class.   We will continue to monitor as she progresses in the program.    Expected Outcomes Patient is new to the program, and she has only completed 1 session.  She has no psychosocial barriers.  We will continue to monitor her progress. Patient will have no psychosocial issuses identified at discharge. Patient  will have no psychosocial issuses identified at discharge. Patient will have no psychosocial issuses identified at discharge.    Interventions Stress management education;Relaxation education;Encouraged to attend Pulmonary Rehabilitation for the exercise Stress management education;Relaxation education;Encouraged to attend Pulmonary Rehabilitation for the exercise Stress management education;Relaxation education;Encouraged to attend Pulmonary Rehabilitation for the exercise Stress management education;Relaxation education;Encouraged to attend Pulmonary Rehabilitation for the exercise    Continue Psychosocial Services  No Follow up required No Follow up required No Follow up required No Follow up required             Psychosocial Discharge (Final Psychosocial Re-Evaluation):  Psychosocial Re-Evaluation - 12/15/22 1456       Psychosocial Re-Evaluation   Current issues with None Identified    Comments Patient has completed 21 sessions.   She seems to enjoy coming to class and  is very quiet, not very talkative.  She continues to have no psychosocial issues identified.  Patient continues to demonstrate a positvie outlook.   We will encourage more interaction with staff and class.   We will continue to monitor as she progresses in the program.    Expected Outcomes Patient will have no psychosocial issuses identified at discharge.    Interventions Stress management education;Relaxation education;Encouraged to attend Pulmonary Rehabilitation for the exercise    Continue Psychosocial Services  No Follow up required              Education: Education Goals: Education classes will be provided on a weekly basis, covering required topics. Participant will state understanding/return demonstration of topics presented.  Learning Barriers/Preferences:  Learning Barriers/Preferences - 09/17/22 1347       Learning Barriers/Preferences   Learning Barriers None    Learning Preferences Written  Material             Education Topics: How Lungs Work and Diseases: - Discuss the anatomy of the lungs and diseases that can affect the lungs, such as COPD. Flowsheet Row PULMONARY REHAB OTHER RESPIRATORY from 12/18/2022 in [place] [facility]  Date 12/04/22  Educator handout       Exercise: -Discuss the importance of exercise, FITT principles of exercise, normal and abnormal responses to exercise, and how to exercise safely.   Environmental Irritants: -Discuss types of environmental irritants and how to limit exposure to environmental irritants.   Meds/Inhalers and oxygen: - Discuss respiratory medications, definition of an inhaler and oxygen, and the proper way to use an inhaler and oxygen. Flowsheet Row PULMONARY REHAB OTHER RESPIRATORY from 12/18/2022 in [place] [facility]  Date 12/18/22  Educator handout       Energy Saving Techniques: - Discuss methods to conserve energy and decrease shortness of breath when performing activities of daily living.  Flowsheet Row PULMONARY REHAB OTHER RESPIRATORY from 12/18/2022 in [place] [facility]  Date 09/25/22  Educator HB  Instruction Review Code 1- Verbalizes Understanding       Bronchial Hygiene / Breathing Techniques: - Discuss breathing mechanics, pursed-lip breathing technique,  proper posture, effective ways to clear airways, and other functional breathing techniques Flowsheet Row PULMONARY REHAB OTHER RESPIRATORY from 12/18/2022 in [place] [facility]  Date 10/02/22  Educator DF  Instruction Review Code 1- Verbalizes Understanding       Cleaning Equipment: - Provides group verbal and written instruction about the health risks of elevated stress, cause of high stress, and healthy ways to reduce stress. Flowsheet Row PULMONARY REHAB OTHER RESPIRATORY from 12/18/2022 in [place] [facility]  Date 10/09/22  Educator HB  Instruction Review Code 1-  Verbalizes Understanding       Nutrition I: Fats: - Discuss the types of cholesterol, what cholesterol does to the body, and how cholesterol levels can be controlled. Flowsheet Row PULMONARY REHAB OTHER RESPIRATORY from 12/18/2022 in [place] [facility]  Date 10/16/22  Educator handout       Nutrition II: Labels: -Discuss the different components of food labels and how to read food labels. Flowsheet Row PULMONARY REHAB OTHER RESPIRATORY from 12/18/2022 in [place] [facility]  Date 10/23/22  Educator DF  Instruction Review Code 1- Verbalizes Understanding       Respiratory Infections: - Discuss the signs and symptoms of respiratory infections, ways to prevent respiratory infections, and the importance of seeking medical treatment when having a respiratory infection. Flowsheet Row PULMONARY REHAB OTHER RESPIRATORY from 12/18/2022 in [place] [facility]  Date 11/06/22  Educator HB  Instruction Review Code 1- Verbalizes Understanding       Stress I: Signs and Symptoms: - Discuss the causes of stress, how stress may lead to anxiety and depression, and ways to limit stress. Flowsheet Row PULMONARY REHAB OTHER RESPIRATORY from 12/18/2022 in [place] [facility]  Date 11/13/22  Educator Handout  Instruction Review Code 1- Verbalizes Understanding       Stress II: Relaxation: -Discuss relaxation techniques to limit stress.   Oxygen for Home/Travel: - Discuss how to prepare for travel when on oxygen and proper ways to transport and store oxygen to ensure safety.   Knowledge Questionnaire Score:  Knowledge Questionnaire Score - 09/17/22 1347       Knowledge Questionnaire Score   Pre Score 16/18             Core Components/Risk Factors/Patient Goals at Admission:  Personal Goals and Risk Factors at Admission - 09/17/22 1348       Core Components/Risk Factors/Patient Goals on Admission    Weight Management  Obesity;Weight Maintenance    Improve shortness of breath with ADL's Yes    Intervention Provide education, individualized exercise plan and daily activity instruction to help decrease symptoms of SOB with activities of daily living.    Expected Outcomes Short Term: Improve cardiorespiratory fitness to achieve a reduction of symptoms when performing ADLs;Long Term: Be able to perform more ADLs without symptoms or delay the onset of symptoms    Diabetes Yes    Intervention Provide education about signs/symptoms and action to take for hypo/hyperglycemia.;Provide education about proper nutrition, including hydration, and aerobic/resistive exercise prescription along with prescribed medications to achieve blood glucose in normal ranges: Fasting glucose 65-99 mg/dL    Expected Outcomes Short Term: Participant verbalizes understanding of the signs/symptoms and immediate care of hyper/hypoglycemia, proper foot care and importance of medication, aerobic/resistive exercise and nutrition plan for blood glucose control.;Long Term: Attainment of HbA1C < 7%.    Heart Failure Yes    Intervention Provide a combined exercise and nutrition program that is supplemented with education, support and  counseling about heart failure. Directed toward relieving symptoms such as shortness of breath, decreased exercise tolerance, and extremity edema.    Expected Outcomes Short term: Attendance in program 2-3 days a week with increased exercise capacity. Reported lower sodium intake. Reported increased fruit and vegetable intake. Reports medication compliance.    Personal Goal Other Yes    Personal Goal Patient wants to improve her stamina and to be able to walk up stairs without SOB.    Intervention Patient will attend PR 2 days/week with exercise and education.    Expected Outcomes Patient will complete the program.             Core Components/Risk Factors/Patient Goals Review:   Goals and Risk Factor Review     Row  Name 09/24/22 1424 10/20/22 1121 11/18/22 1502 12/15/22 1457       Core Components/Risk Factors/Patient Goals Review   Personal Goals Review Other;Weight Management/Obesity;Diabetes Other;Weight Management/Obesity;Diabetes Other;Weight Management/Obesity;Diabetes Other;Weight Management/Obesity;Diabetes    Review Pt is new to the program, and she was referred to PR due to dyspne on exertion.  Her current weight is 116.7 Kg.  She is exercising on RA with 02 sats from 95-97%.  Her goals are to improve her stamina and be able to walk up stairs.  We will continue to monitor her progress as she works toward meeting her goals. Patient has completed 8 sessions .  Her personal goal is to loss weight and current weight is 117.6 Kg.  We will encouage weight loss with decussion and hand outs.  Patient has Blood sugar levels at 180-243mg /dl prior to class.  She is exercising on RA with 02 sats from 96%-98%.  Her goals are to improve her stamina and be able to walk up stairs.  We will continue to monitor her progress as she works toward meeting her goals. Patient has completed 16sessions .  Her personal goal is to loss weight and current weight is 117.6 Kg, remains about the same.   We will encouage weight loss with decussion and hand outs.  She is exercising on RA no supplemental  oxygen needed at this time, with 02 sats from 93%-98%.  We  will help with developing efficient breathing techniques such as purse lipped breathing and practice self pacing with increased activity.   Her goals are to improve her stamina and be able to walk up stairs.  We will continue to monitor her progress as she works toward meeting her goals. Patient has completed 21 sessions .  Her personal goal is to loss weight and current weight is 115.2 Kg, down about 5 #.   We will continue to encouage weight loss with decussion and hand outs.  She is exercising on RA no supplemental  oxygen needed at this time, with 02 sats from 94%-97%.  We  will help  with developing efficient breathing techniques such as purse lipped breathing and practice self pacing with increased activity.   Her goals are to improve her stamina and be able to walk up stairs.  We will continue to monitor her progress as she works toward meeting her goals.    Expected Outcomes Pt will complete the program and meet her personal goals as well as the program goals. Pt will complete the program and meet her personal goals as well as the program goals. Pt will complete the program and meet her personal goals as well as the program goals. Pt will complete the program and meet her personal goals as well  as the program goals.             Core Components/Risk Factors/Patient Goals at Discharge (Final Review):   Goals and Risk Factor Review - 12/15/22 1457       Core Components/Risk Factors/Patient Goals Review   Personal Goals Review Other;Weight Management/Obesity;Diabetes    Review Patient has completed 21 sessions .  Her personal goal is to loss weight and current weight is 115.2 Kg, down about 5 #.   We will continue to encouage weight loss with decussion and hand outs.  She is exercising on RA no supplemental  oxygen needed at this time, with 02 sats from 94%-97%.  We  will help with developing efficient breathing techniques such as purse lipped breathing and practice self pacing with increased activity.   Her goals are to improve her stamina and be able to walk up stairs.  We will continue to monitor her progress as she works toward meeting her goals.    Expected Outcomes Pt will complete the program and meet her personal goals as well as the program goals.             ITP Comments:   Comments: ITP REVIEW Pt is making expected progress toward pulmonary rehab goals after completing 24 sessions. Recommend continued exercise, life style modification, education, and utilization of breathing techniques to increase stamina and strength and decrease shortness of breath with  exertion.

## 2022-12-25 NOTE — Telephone Encounter (Signed)
Patient left a voicemail yesterday ask nurse please return her call [phone]

## 2022-12-25 NOTE — Telephone Encounter (Signed)
Spoke with patient.

## 2022-12-25 NOTE — Progress Notes (Signed)
Daily Session Note  Patient Details  Name: Julie Bean MRN: [id number] Date of Birth: [birth year] Referring Provider:   Flowsheet Row CARDIAC REHAB PHASE II ORIENTATION from 09/17/2022 in [facility] CARDIAC REHABILITATION  Referring Provider Dr. Ronnie Doss       Encounter Date: 12/25/2022  Check In:  Session Check In - 12/25/22 1330       Check-In   Supervising physician immediately available to respond to emergencies [facility] MD immediately available    Physician(s) Dr Jenene Slicker    Location [facility]    Staff Present Ross Ludwig, BS, Exercise Physiologist;Teeghan Hammer Daphine Deutscher, RN, BSN;Other   Kary Kos, EP   Virtual Visit No    Medication changes reported     No    Fall or balance concerns reported    Yes    Comments Patient reports poor balance and has a history of falls.    Tobacco Cessation No Change    Warm-up and Cool-down Performed as group-led instruction    Resistance Training Performed Yes      Pain Assessment   Currently in Pain? Yes    Pain Score 4     Pain Location Shoulder    Pain Orientation Left    Pain Descriptors / Indicators Aching    Multiple Pain Sites Yes      2nd Pain Site   Pain Score 3    Pain Type Chronic pain    Pain Location Shoulder    Pain Orientation Left    Pain Descriptors / Indicators Aching             Capillary Blood Glucose: No results found for this or any previous visit (from the past 24 hour(s)).    Social History   Tobacco Use  Smoking Status Never  Smokeless Tobacco Never  Tobacco Comments   Both parents smoked    Goals Met:  Proper associated with RPD/PD & O2 Sat Independence with exercise equipment Using PLB without cueing & demonstrates good technique Exercise tolerated well Queuing for purse lip breathing No report of concerns or symptoms today Strength training completed today  Goals Unmet:  Not Applicable  Comments: Checkout at 1430.   Dr. Erick Blinks is Medical  Director for [facility] Pulmonary Rehab.

## 2022-12-30 ENCOUNTER — Encounter (HOSPITAL_COMMUNITY)
Admission: RE | Admit: 2022-12-30 | Discharge: 2022-12-30 | Disposition: A | Discharge status: Home / Self Care | Payer: Medicare PPO | Source: Ambulatory Visit | Attending: Internal Medicine | Admitting: Internal Medicine

## 2022-12-30 DIAGNOSIS — R0609 Other forms of dyspnea: Secondary | ICD-10-CM | POA: Diagnosis not present

## 2022-12-30 NOTE — Progress Notes (Signed)
Daily Session Note  Patient Details  Name: Julie Bean MRN: [id number] Date of Birth: Aug 06, 1951 Referring Provider:   Flowsheet Row CARDIAC REHAB PHASE II ORIENTATION from 09/17/2022 in [facility] CARDIAC REHABILITATION  Referring Provider Dr. Ronnie Doss       Encounter Date: 12/30/2022  Check In:  Session Check In - 12/30/22 1329       Check-In   Supervising physician immediately available to respond to emergencies [facility] MD immediately available    Physician(s) Dr. Wyline Mood    Location [facility]    Staff Present Erskine Speed, RN;Other;Daphyne Daphine Deutscher, RN, BSN    Virtual Visit No    Medication changes reported     No    Fall or balance concerns reported    Yes    Comments Patient reports poor balance and has a history of falls.    Tobacco Cessation No Change    Warm-up and Cool-down Performed as group-led instruction    Resistance Training Performed Yes    VAD Patient? No    PAD/SET Patient? No      Pain Assessment   Currently in Pain? Yes    Pain Score 4     Pain Location Shoulder    Pain Orientation Left;Right    Pain Descriptors / Indicators Aching    Pain Type Chronic pain    Pain Onset More than a month ago    Pain Frequency Constant    Multiple Pain Sites Yes      2nd Pain Site   Pain Score 3    Pain Type Chronic pain    Pain Location Shoulder    Pain Orientation Left    Pain Descriptors / Indicators Aching    Pain Frequency Constant      Pain   Pain Onset More than a month ago             Capillary Blood Glucose: No results found for this or any previous visit (from the past 24 hour(s)).    Social History   Tobacco Use  Smoking Status Never  Smokeless Tobacco Never  Tobacco Comments   Both parents smoked    Goals Met:  Proper associated with RPD/PD & O2 Sat Independence with exercise equipment Using PLB without cueing & demonstrates good technique Exercise tolerated well No report of concerns or symptoms  today Strength training completed today  Goals Unmet:  Not Applicable  Comments: check out @ 2:30pm    Dr. Erick Blinks is Medical Director for [facility] Pulmonary Rehab.

## 2023-01-01 ENCOUNTER — Encounter (HOSPITAL_COMMUNITY)
Admission: RE | Admit: 2023-01-01 | Discharge: 2023-01-01 | Disposition: A | Discharge status: Home / Self Care | Payer: Medicare PPO | Source: Ambulatory Visit | Attending: Internal Medicine | Admitting: Internal Medicine

## 2023-01-01 DIAGNOSIS — R0609 Other forms of dyspnea: Secondary | ICD-10-CM

## 2023-01-01 NOTE — Progress Notes (Addendum)
Daily Session Note  Patient Details  Name: Julie Bean MRN: [id number] Date of Birth: [birth year] Referring Provider:   Flowsheet Row CARDIAC REHAB PHASE II ORIENTATION from 09/17/2022 in [facility] CARDIAC REHABILITATION  Referring Provider Dr. Ronnie Doss       Encounter Date: 01/01/2023  Check In:  Session Check In - 01/01/23 1330       Check-In   Supervising physician immediately available to respond to emergencies [facility] MD immediately available    Physician(s) Dr. Wyline Mood    Location [facility]    Staff Present Ross Ludwig, BS, Exercise Physiologist;Tangia Pinard BSN, RN    Virtual Visit No    Medication changes reported     No    Fall or balance concerns reported    Yes    Comments Patient reports poor balance and has a history of falls.    Tobacco Cessation No Change    Warm-up and Cool-down Performed as group-led instruction    Resistance Training Performed Yes    VAD Patient? No    PAD/SET Patient? No      Pain Assessment   Currently in Pain? Yes    Pain Score 4     Pain Location Shoulder    Pain Orientation Left    Pain Descriptors / Indicators Aching    Pain Type Chronic pain    Pain Onset More than a month ago    Pain Frequency Constant    Effect of Pain on Daily Activities Limits upper body mobility    Multiple Pain Sites Yes      2nd Pain Site   Pain Score 3    Pain Type Chronic pain    Pain Location Shoulder    Pain Orientation Right    Pain Descriptors / Indicators Aching    Pain Frequency Constant      Pain   Pain Onset More than a month ago      Pain Screening   Effect of Pain on Daily Activities Limits her upper body mobility.             Capillary Blood Glucose: No results found for this or any previous visit (from the past 24 hour(s)).    Social History   Tobacco Use  Smoking Status Never  Smokeless Tobacco Never  Tobacco Comments   Both parents smoked    Goals Met:  Proper associated with  RPD/PD & O2 Sat Independence with exercise equipment Using PLB without cueing & demonstrates good technique Exercise tolerated well Queuing for purse lip breathing No report of concerns or symptoms today Strength training completed today  Goals Unmet:  Not Applicable  Comments: Pt had to leave at 13:48 due to her stomach hurting/diarrhea.  The pt stated the diarrhea is a side effect of her Glipizide.   Dr. Erick Blinks is Medical Director for [facility] Pulmonary Rehab.

## 2023-01-05 ENCOUNTER — Other Ambulatory Visit: Payer: Self-pay

## 2023-01-05 ENCOUNTER — Encounter: Payer: Self-pay | Admitting: Internal Medicine

## 2023-01-05 DIAGNOSIS — E1165 Type 2 diabetes mellitus with hyperglycemia: Secondary | ICD-10-CM

## 2023-01-05 MED ORDER — GLIPIZIDE ER 10 MG PO TB24
ORAL_TABLET | ORAL | 0 refills | Status: DC
Start: 2023-01-05 — End: 2023-04-07

## 2023-01-06 ENCOUNTER — Encounter (HOSPITAL_COMMUNITY)
Admission: RE | Admit: 2023-01-06 | Discharge: 2023-01-06 | Disposition: A | Discharge status: Home / Self Care | Payer: Medicare PPO | Source: Ambulatory Visit | Attending: Internal Medicine | Admitting: Internal Medicine

## 2023-01-06 VITALS — Wt 253.5 lb

## 2023-01-06 DIAGNOSIS — R0609 Other forms of dyspnea: Secondary | ICD-10-CM | POA: Diagnosis not present

## 2023-01-06 NOTE — Progress Notes (Signed)
Daily Session Note  Patient Details  Name: Julie Bean MRN: [id number] Date of Birth: 21-Nov-1950 Referring Provider:   Flowsheet Row CARDIAC REHAB PHASE II ORIENTATION from 09/17/2022 in [facility] CARDIAC REHABILITATION  Referring Provider Dr. Ronnie Doss       Encounter Date: 01/06/2023  Check In:  Session Check In - 01/06/23 1317       Check-In   Supervising physician immediately available to respond to emergencies [facility] MD immediately available    Physician(s) Dr. Wyline Mood    Location [facility]    Staff Present Ross Ludwig, BS, Exercise Physiologist;Phyllis Billingsley, RN;Sung Renton Smeal Daphine Deutscher, RN, BSN    Virtual Visit No    Medication changes reported     Yes    Comments 01/06/2023-Tramodol 50 mg added every 6 hour prn; maximium 2 tablets daily.    Fall or balance concerns reported    Yes    Comments Patient reports poor balance and has a history of falls.    Tobacco Cessation No Change    Warm-up and Cool-down Performed as group-led instruction    Resistance Training Performed Yes      Pain Assessment   Currently in Pain? Yes    Pain Score 4     Pain Location Shoulder    Pain Orientation Left    Pain Descriptors / Indicators Aching    Pain Type Chronic pain      2nd Pain Site   Pain Score 3    Pain Type Chronic pain    Pain Location Shoulder    Pain Orientation Right    Pain Descriptors / Indicators Aching             Capillary Blood Glucose: No results found for this or any previous visit (from the past 24 hour(s)).    Social History   Tobacco Use  Smoking Status Never  Smokeless Tobacco Never  Tobacco Comments   Both parents smoked    Goals Met:  Proper associated with RPD/PD & O2 Sat Independence with exercise equipment Using PLB without cueing & demonstrates good technique Exercise tolerated well Queuing for purse lip breathing No report of concerns or symptoms today Strength training completed today  Goals Unmet:   Not Applicable  Comments: Checkout at 1430.   Dr. Erick Blinks is Medical Director for [facility] Pulmonary Rehab.

## 2023-01-08 ENCOUNTER — Encounter (HOSPITAL_COMMUNITY)
Admission: RE | Admit: 2023-01-08 | Discharge: 2023-01-08 | Disposition: A | Discharge status: Home / Self Care | Payer: Medicare PPO | Source: Ambulatory Visit | Attending: Internal Medicine | Admitting: Internal Medicine

## 2023-01-08 DIAGNOSIS — R0609 Other forms of dyspnea: Secondary | ICD-10-CM

## 2023-01-08 NOTE — Progress Notes (Signed)
Daily Session Note  Patient Details  Name: Julie Bean MRN: [id number] Date of Birth: [birth year] Referring Provider:   Flowsheet Row CARDIAC REHAB PHASE II ORIENTATION from 09/17/2022 in [facility] CARDIAC REHABILITATION  Referring Provider Dr. Ronnie Doss       Encounter Date: 01/08/2023  Check In:  Session Check In - 01/08/23 1314       Check-In   Supervising physician immediately available to respond to emergencies [facility] immediately available    Physician(s) Dr Diona Browner    Location [facility]    Staff Present Ross Ludwig, BS, Exercise Physiologist;Bryant Saye Daphine Deutscher, RN, BSN    Virtual Visit No    Medication changes reported     No    Fall or balance concerns reported    Yes    Comments Patient reports poor balance and has a history of falls.    Tobacco Cessation No Change    Warm-up and Cool-down Performed as group-led instruction    Resistance Training Performed Yes      Pain Assessment   Currently in Pain? Yes    Pain Score 4     Pain Location Shoulder    Pain Orientation Left    Pain Descriptors / Indicators Aching    Pain Type Chronic pain      2nd Pain Site   Pain Score 3    Pain Type Chronic pain    Pain Location Shoulder    Pain Orientation Right    Pain Descriptors / Indicators Aching             Capillary Blood Glucose: No results found for this or any previous visit (from the past 24 hour(s)).    Social History   Tobacco Use  Smoking Status Never  Smokeless Tobacco Never  Tobacco Comments   Both parents smoked    Goals Met:  Proper associated with RPD/PD & O2 Sat Independence with exercise equipment Using PLB without cueing & demonstrates good technique Exercise tolerated well Queuing for purse lip breathing No report of concerns or symptoms today Strength training completed today  Goals Unmet:  Not Applicable  Comments: Checkout at 1430.   Dr. Erick Blinks is Medical Director for [facility]  Pulmonary Rehab.

## 2023-01-13 ENCOUNTER — Encounter (HOSPITAL_COMMUNITY)
Admission: RE | Admit: 2023-01-13 | Discharge: 2023-01-13 | Disposition: A | Discharge status: Home / Self Care | Payer: Medicare PPO | Source: Ambulatory Visit | Attending: Internal Medicine | Admitting: Internal Medicine

## 2023-01-13 DIAGNOSIS — R0609 Other forms of dyspnea: Secondary | ICD-10-CM

## 2023-01-13 NOTE — Progress Notes (Signed)
Daily Session Note  Patient Details  Name: Julie Bean MRN: [id number] Date of Birth: 1951-06-10 Referring Provider:   Flowsheet Row CARDIAC REHAB PHASE II ORIENTATION from 09/17/2022 in [facility] CARDIAC REHABILITATION  Referring Provider Dr. Ronnie Doss       Encounter Date: 01/13/2023  Check In:  Session Check In - 01/13/23 1329       Check-In   Supervising physician immediately available to respond to emergencies [facility] MD immediately available    Physician(s) Dr. Jenene Slicker    Location [facility]    Staff Present Ross Ludwig, BS, Exercise Physiologist;Marcile Fuquay Debbe Mounts, MS, ACSM-CEP    Virtual Visit No    Medication changes reported     No    Fall or balance concerns reported    Yes    Comments Patient reports poor balance and has a history of falls.    Tobacco Cessation No Change    Warm-up and Cool-down Performed on first and last piece of equipment    Resistance Training Performed Yes    VAD Patient? No    PAD/SET Patient? No      Pain Assessment   Currently in Pain? Yes    Pain Score 4     Pain Location Shoulder    Pain Orientation Left    Pain Descriptors / Indicators Aching    Pain Type Chronic pain    Pain Onset More than a month ago    Pain Frequency Constant    Multiple Pain Sites No             Capillary Blood Glucose: No results found for this or any previous visit (from the past 24 hour(s)).    Social History   Tobacco Use  Smoking Status Never  Smokeless Tobacco Never  Tobacco Comments   Both parents smoked    Goals Met:  Proper associated with RPD/PD & O2 Sat Improved SOB with ADL's Using PLB without cueing & demonstrates good technique Exercise tolerated well Queuing for purse lip breathing No report of concerns or symptoms today Strength training completed today  Goals Unmet:  Not Applicable  Comments: checkout time is 1430   Dr. Erick Blinks is Medical Director for [facility] Pulmonary  Rehab.

## 2023-01-13 NOTE — Progress Notes (Signed)
I have reviewed a Home Exercise Prescription with Julie Bean . Julie Bean is not currently exercising at home.  The patient was advised to walk 3-5 days a week for 30-45 minutes.  Julie Bean and I discussed how to progress their exercise prescription.  The patient stated that their goals were to be able to walk long distances and better but stated that due to lower back issues and BLE issues she is not able to meet that goal. EP asked if she has been through PT to help with walking and pain management, pt stated that PT did not help.  The patient stated that they understand the exercise prescription.  We reviewed exercise guidelines, target heart rate during exercise, RPE Scale, weather conditions, NTG use, endpoints for exercise, warmup and cool down.  Patient is encouraged to come to me with any questions. I will continue to follow up with the patient to assist them with progression and safety.

## 2023-01-15 ENCOUNTER — Encounter (HOSPITAL_COMMUNITY)
Admission: RE | Admit: 2023-01-15 | Discharge: 2023-01-15 | Disposition: A | Discharge status: Home / Self Care | Payer: Medicare PPO | Source: Ambulatory Visit | Attending: Internal Medicine | Admitting: Internal Medicine

## 2023-01-15 DIAGNOSIS — R0609 Other forms of dyspnea: Secondary | ICD-10-CM

## 2023-01-15 NOTE — Progress Notes (Signed)
Daily Session Note  Patient Details  Name: Julie Bean MRN: [id number] Date of Birth: Feb 02, 1951 Referring Provider:   Flowsheet Row CARDIAC REHAB PHASE II ORIENTATION from 09/17/2022 in [facility] CARDIAC REHABILITATION  Referring Provider Dr. Ronnie Doss       Encounter Date: 01/15/2023  Check In:  Session Check In - 01/15/23 1330       Check-In   Supervising physician immediately available to respond to emergencies [facility] MD immediately available    Physician(s) Dr. Jenene Slicker    Location [facility]    Staff Present Ross Ludwig, BS, Exercise Physiologist;Lonya Johannesen BSN, RN    Virtual Visit No    Medication changes reported     No    Fall or balance concerns reported    Yes    Comments Patient reports poor balance and has a history of falls.    Tobacco Cessation No Change    Warm-up and Cool-down Performed as group-led instruction    Resistance Training Performed Yes    VAD Patient? No    PAD/SET Patient? No      Pain Assessment   Currently in Pain? Yes    Pain Score 5     Pain Location Shoulder    Pain Orientation Left    Pain Descriptors / Indicators Aching    Pain Type Chronic pain    Pain Onset More than a month ago    Pain Frequency Constant    Multiple Pain Sites Yes      2nd Pain Site   Pain Score 4    Pain Type Chronic pain    Pain Location Shoulder    Pain Orientation Right    Pain Descriptors / Indicators Aching    Pain Frequency Constant      Pain   Pain Onset More than a month ago             Capillary Blood Glucose: No results found for this or any previous visit (from the past 24 hour(s)).    Social History   Tobacco Use  Smoking Status Never  Smokeless Tobacco Never  Tobacco Comments   Both parents smoked    Goals Met:  Proper associated with RPD/PD & O2 Sat Independence with exercise equipment Using PLB without cueing & demonstrates good technique Exercise tolerated well Queuing for purse lip  breathing No report of concerns or symptoms today Strength training completed today  Goals Unmet:  Not Applicable  Comments: check out at 14:30   Dr. Erick Blinks is Medical Director for [facility] Pulmonary Rehab.

## 2023-01-20 ENCOUNTER — Encounter (HOSPITAL_COMMUNITY)
Admission: RE | Admit: 2023-01-20 | Discharge: 2023-01-20 | Disposition: A | Discharge status: Home / Self Care | Payer: Medicare PPO | Source: Ambulatory Visit | Attending: Internal Medicine | Admitting: Internal Medicine

## 2023-01-20 VITALS — Ht 68.0 in | Wt 257.1 lb

## 2023-01-20 DIAGNOSIS — R0609 Other forms of dyspnea: Secondary | ICD-10-CM

## 2023-01-20 NOTE — Progress Notes (Signed)
Daily Session Note  Patient Details  Name: Rowyn Norquist MRN: [id number] Date of Birth: 03-11-1951 Referring Provider:   Flowsheet Row CARDIAC REHAB PHASE II ORIENTATION from 09/17/2022 in [facility] CARDIAC REHABILITATION  Referring Provider Dr. Ronnie Doss       Encounter Date: 01/20/2023  Check In:  Session Check In - 01/20/23 1330       Check-In   Supervising physician immediately available to respond to emergencies [facility] MD immediately available    Physician(s) Dr Tenny Craw    Location [facility]    Staff Present Ross Ludwig, BS, Exercise Physiologist;Neeti Knudtson Daphine Deutscher, RN, BSN    Virtual Visit No    Medication changes reported     No    Fall or balance concerns reported    Yes    Comments Patient reports poor balance and has a history of falls.    Tobacco Cessation No Change    Warm-up and Cool-down Performed as group-led instruction    Resistance Training Performed Yes      Pain Assessment   Currently in Pain? No/denies    Pain Score 0-No pain             Capillary Blood Glucose: No results found for this or any previous visit (from the past 24 hour(s)).    Social History   Tobacco Use  Smoking Status Never  Smokeless Tobacco Never  Tobacco Comments   Both parents smoked    Goals Met:  Proper associated with RPD/PD & O2 Sat Independence with exercise equipment Using PLB without cueing & demonstrates good technique Exercise tolerated well Queuing for purse lip breathing No report of concerns or symptoms today Strength training completed today  Goals Unmet:  Not Applicable  Comments: Checkout at 1430.   Dr. Erick Blinks is Medical Director for [facility] Pulmonary Rehab.

## 2023-01-22 NOTE — Progress Notes (Signed)
Discharge Progress Report  Patient Details  Name: Julie Bean MRN: [id number] Date of Birth: 1951-07-22 Referring Provider:   Flowsheet Row CARDIAC REHAB PHASE II ORIENTATION from 09/17/2022 in [facility] CARDIAC REHABILITATION  Referring Provider Dr. Ronnie Doss        Number of Visits: 32  Reason for Discharge:  Patient reached a stable level of exercise. Patient independent in their exercise. Patient has met program and personal goals.  Smoking History:  Social History   Tobacco Use  Smoking Status Never  Smokeless Tobacco Never  Tobacco Comments   Both parents smoked    Diagnosis:  DOE (dyspnea on exertion)  ADL UCSD:  Pulmonary Assessment Scores     Row Name 09/17/22 1351 01/22/23 1456       ADL UCSD   ADL Phase Entry Exit    SOB Score total 17 50    Rest 0 0    Walk 0 2    Stairs 4 3    Bath 0 1    Dress 0 2    Shop 1 3      CAT Score   CAT Score 6 6      mMRC Score   mMRC Score 2 2             Initial Exercise Prescription:  Initial Exercise Prescription - 09/17/22 1400       Date of Initial Exercise RX and Referring Provider   Date 09/17/22    Referring Provider Dr. Ronnie Doss    Expected Discharge Date 01/20/23      NuStep   Level 1    SPM 60    Minutes 22      Arm Ergometer   Level 1    RPM 40    Minutes 17      Prescription Details   Frequency (times per week) 2    Duration Progress to 30 minutes of continuous aerobic without signs/symptoms of physical distress      Intensity   THRR 40-80% of Max Heartrate 60-120    Ratings of Perceived Exertion 11-13    Perceived Dyspnea 0-4      Resistance Training   Training Prescription Yes    Weight 3    Reps 10-15             Discharge Exercise Prescription (Final Exercise Prescription Changes):  Exercise Prescription Changes - 01/13/23 1400       Home Exercise Plan   Plans to continue exercise at Home (comment)    Frequency Add 3 additional days to  program exercise sessions.    Initial Home Exercises Provided 01/13/23             Functional Capacity:  6 Minute Walk     Row Name 09/17/22 1406 01/20/23 1521       6 Minute Walk   Phase Initial Discharge    Distance 800 feet 750 feet    Walk Time 6 minutes 6 minutes    # of Rest Breaks 0 1    MPH 1.51 1.42    METS 1.38 1.34    RPE 13 13    Perceived Dyspnea  11 12    VO2 Peak 4.85 4.7    Symptoms No Yes (comment)    Comments none pt took on seated break due to SOB    Resting HR 80 bpm 78 bpm    Resting BP 100/56 116/64    Resting Oxygen Saturation  96 % 95 %  Exercise Oxygen Saturation  during 6 min walk 97 % 93 %    Max Ex. HR 115 bpm 112 bpm    Max Ex. BP 120/60 126/62    2 Minute Post BP 110/58 116/60      Interval HR   1 Minute HR 104 108    2 Minute HR 107 107    3 Minute HR 110 110    4 Minute HR 112 103    5 Minute HR 114 109    6 Minute HR 115 112    2 Minute Post HR 81 75    Interval Heart Rate? Yes Yes      Interval Oxygen   Interval Oxygen? Yes Yes    Baseline Oxygen Saturation % 96 % 62 %    1 Minute Oxygen Saturation % 97 % 93 %    1 Minute Liters of Oxygen 0 L 0 L    2 Minute Oxygen Saturation % 97 % 95 %    2 Minute Liters of Oxygen 0 L 0 L    3 Minute Oxygen Saturation % 97 % 96 %    3 Minute Liters of Oxygen 0 L 0 L    4 Minute Oxygen Saturation % 97 % 96 %    4 Minute Liters of Oxygen 0 L 0 L    5 Minute Oxygen Saturation % 98 % 95 %    5 Minute Liters of Oxygen 0 L 0 L    6 Minute Oxygen Saturation % 98 % 95 %    6 Minute Liters of Oxygen 0 L 0 L    2 Minute Post Oxygen Saturation % 98 % 96 %    2 Minute Post Liters of Oxygen 0 L 0 L             Psychological, QOL, Others - Outcomes: PHQ 2/9:    10/29/2022    2:49 PM 10/07/2022   10:41 AM 09/17/2022    1:42 PM 09/09/2022   10:21 AM 08/05/2022    9:45 AM  Depression screen PHQ 2/9  Decreased Interest 0 0 0 0 0  Down, Depressed, Hopeless 0 0 0 0 0  PHQ - 2 Score 0 0 0 0 0   Altered sleeping 1 1 0  1  Tired, decreased energy 0 0 0  1  Change in appetite 1 1 0  1  Feeling bad or failure about yourself  0 0 0  0  Trouble concentrating 0 0 0  0  Moving slowly or fidgety/restless 0 0 0  0  Suicidal thoughts 0 0 0  0  PHQ-9 Score 2 2 0  3  Difficult doing work/chores   Not difficult at all      Quality of Life:  Quality of Life - 01/20/23 1525       Quality of Life   Select Quality of Life      Quality of Life Scores   Health/Function Pre 21.75 %    Health/Function Post 15.75 %    Health/Function % Change -27.59 %    Socioeconomic Pre 26.43 %    Socioeconomic Post 21.43 %    Socioeconomic % Change  -18.92 %    Psych/Spiritual Pre 25.93 %    Psych/Spiritual Post 20.29 %    Psych/Spiritual % Change -21.75 %    Family Pre 27.17 %    Family Post 24 %    Family % Change -11.67 %  GLOBAL Pre 24.12 %    GLOBAL Post 18.68 %    GLOBAL % Change -22.55 %             Personal Goals: Goals established at orientation with interventions provided to work toward goal.  Personal Goals and Risk Factors at Admission - 09/17/22 1348       Core Components/Risk Factors/Patient Goals on Admission    Weight Management Obesity;Weight Maintenance    Improve shortness of breath with ADL's Yes    Intervention Provide education, individualized exercise plan and daily activity instruction to help decrease symptoms of SOB with activities of daily living.    Expected Outcomes Short Term: Improve cardiorespiratory fitness to achieve a reduction of symptoms when performing ADLs;Long Term: Be able to perform more ADLs without symptoms or delay the onset of symptoms    Diabetes Yes    Intervention Provide education about signs/symptoms and action to take for hypo/hyperglycemia.;Provide education about proper nutrition, including hydration, and aerobic/resistive exercise prescription along with prescribed medications to achieve blood glucose in normal ranges: Fasting  glucose 65-99 mg/dL    Expected Outcomes Short Term: Participant verbalizes understanding of the signs/symptoms and immediate care of hyper/hypoglycemia, proper foot care and importance of medication, aerobic/resistive exercise and nutrition plan for blood glucose control.;Long Term: Attainment of HbA1C < 7%.    Heart Failure Yes    Intervention Provide a combined exercise and nutrition program that is supplemented with education, support and counseling about heart failure. Directed toward relieving symptoms such as shortness of breath, decreased exercise tolerance, and extremity edema.    Expected Outcomes Short term: Attendance in program 2-3 days a week with increased exercise capacity. Reported lower sodium intake. Reported increased fruit and vegetable intake. Reports medication compliance.    Personal Goal Other Yes    Personal Goal Patient wants to improve her stamina and to be able to walk up stairs without SOB.    Intervention Patient will attend PR 2 days/week with exercise and education.    Expected Outcomes Patient will complete the program.              Personal Goals Discharge:  Goals and Risk Factor Review     Row Name 09/24/22 1424 10/20/22 1121 11/18/22 1502 12/15/22 1457 01/22/23 1505     Core Components/Risk Factors/Patient Goals Review   Personal Goals Review Other;Weight Management/Obesity;Diabetes Other;Weight Management/Obesity;Diabetes Other;Weight Management/Obesity;Diabetes Other;Weight Management/Obesity;Diabetes Other;Weight Management/Obesity;Diabetes   Review Pt is new to the program, and she was referred to PR due to dyspne on exertion.  Her current weight is 116.7 Kg.  She is exercising on RA with 02 sats from 95-97%.  Her goals are to improve her stamina and be able to walk up stairs.  We will continue to monitor her progress as she works toward meeting her goals. Patient has completed 8 sessions .  Her personal goal is to loss weight and current weight is  117.6 Kg.  We will encouage weight loss with decussion and hand outs.  Patient has Blood sugar levels at 180-243mg /dl prior to class.  She is exercising on RA with 02 sats from 96%-98%.  Her goals are to improve her stamina and be able to walk up stairs.  We will continue to monitor her progress as she works toward meeting her goals. Patient has completed 16sessions .  Her personal goal is to loss weight and current weight is 117.6 Kg, remains about the same.   We will encouage weight loss with decussion and  hand outs.  She is exercising on RA no supplemental  oxygen needed at this time, with 02 sats from 93%-98%.  We  will help with developing efficient breathing techniques such as purse lipped breathing and practice self pacing with increased activity.   Her goals are to improve her stamina and be able to walk up stairs.  We will continue to monitor her progress as she works toward meeting her goals. Patient has completed 21 sessions .  Her personal goal is to loss weight and current weight is 115.2 Kg, down about 5 #.   We will continue to encouage weight loss with decussion and hand outs.  She is exercising on RA no supplemental  oxygen needed at this time, with 02 sats from 94%-97%.  We  will help with developing efficient breathing techniques such as purse lipped breathing and practice self pacing with increased activity.   Her goals are to improve her stamina and be able to walk up stairs.  We will continue to monitor her progress as she works toward meeting her goals. Pt graduated from PR after 32 sessions. Her weight was stable while in the program, only losing about 1.5 lbs. Her vitals were WNLs while she was in the program. She did not meet her goal of increasing her stamina and improving stair walking. She was limited in her abilities to push herself in rehab due to several joint issues. Her walk test distance decreased by 12.5%, and her MET level never got above 2.0.   Expected Outcomes Pt will  complete the program and meet her personal goals as well as the program goals. Pt will complete the program and meet her personal goals as well as the program goals. Pt will complete the program and meet her personal goals as well as the program goals. Pt will complete the program and meet her personal goals as well as the program goals. Pt will continue to work towards their personal goals post discharge.            Exercise Goals and Review:  Exercise Goals     Row Name 09/17/22 1410 09/30/22 1554 10/28/22 1549 11/25/22 1529 12/24/22 1336     Exercise Goals   Increase Physical Activity Yes Yes Yes Yes Yes   Intervention Provide advice, education, support and counseling about physical activity/exercise needs.;Develop an individualized exercise prescription for aerobic and resistive training based on initial evaluation findings, risk stratification, comorbidities and participant's personal goals. Provide advice, education, support and counseling about physical activity/exercise needs.;Develop an individualized exercise prescription for aerobic and resistive training based on initial evaluation findings, risk stratification, comorbidities and participant's personal goals. Provide advice, education, support and counseling about physical activity/exercise needs.;Develop an individualized exercise prescription for aerobic and resistive training based on initial evaluation findings, risk stratification, comorbidities and participant's personal goals. Provide advice, education, support and counseling about physical activity/exercise needs.;Develop an individualized exercise prescription for aerobic and resistive training based on initial evaluation findings, risk stratification, comorbidities and participant's personal goals. Provide advice, education, support and counseling about physical activity/exercise needs.;Develop an individualized exercise prescription for aerobic and resistive training based on  initial evaluation findings, risk stratification, comorbidities and participant's personal goals.   Expected Outcomes Short Term: Attend rehab on a regular basis to increase amount of physical activity.;Long Term: Add in home exercise to make exercise part of routine and to increase amount of physical activity.;Long Term: Exercising regularly at least 3-5 days a week. Short Term: Attend rehab on a regular  basis to increase amount of physical activity.;Long Term: Add in home exercise to make exercise part of routine and to increase amount of physical activity.;Long Term: Exercising regularly at least 3-5 days a week. Short Term: Attend rehab on a regular basis to increase amount of physical activity.;Long Term: Add in home exercise to make exercise part of routine and to increase amount of physical activity.;Long Term: Exercising regularly at least 3-5 days a week. Short Term: Attend rehab on a regular basis to increase amount of physical activity.;Long Term: Add in home exercise to make exercise part of routine and to increase amount of physical activity.;Long Term: Exercising regularly at least 3-5 days a week. Short Term: Attend rehab on a regular basis to increase amount of physical activity.;Long Term: Add in home exercise to make exercise part of routine and to increase amount of physical activity.;Long Term: Exercising regularly at least 3-5 days a week.   Increase Strength and Stamina Yes Yes Yes Yes Yes   Intervention Provide advice, education, support and counseling about physical activity/exercise needs.;Develop an individualized exercise prescription for aerobic and resistive training based on initial evaluation findings, risk stratification, comorbidities and participant's personal goals. Provide advice, education, support and counseling about physical activity/exercise needs.;Develop an individualized exercise prescription for aerobic and resistive training based on initial evaluation findings, risk  stratification, comorbidities and participant's personal goals. Provide advice, education, support and counseling about physical activity/exercise needs.;Develop an individualized exercise prescription for aerobic and resistive training based on initial evaluation findings, risk stratification, comorbidities and participant's personal goals. Provide advice, education, support and counseling about physical activity/exercise needs.;Develop an individualized exercise prescription for aerobic and resistive training based on initial evaluation findings, risk stratification, comorbidities and participant's personal goals. Provide advice, education, support and counseling about physical activity/exercise needs.;Develop an individualized exercise prescription for aerobic and resistive training based on initial evaluation findings, risk stratification, comorbidities and participant's personal goals.   Expected Outcomes Short Term: Increase workloads from initial exercise prescription for resistance, speed, and METs.;Short Term: Perform resistance training exercises routinely during rehab and add in resistance training at home;Long Term: Improve cardiorespiratory fitness, muscular endurance and strength as measured by increased METs and functional capacity ( ) Short Term: Increase workloads from initial exercise prescription for resistance, speed, and METs.;Short Term: Perform resistance training exercises routinely during rehab and add in resistance training at home;Long Term: Improve cardiorespiratory fitness, muscular endurance and strength as measured by increased METs and functional capacity ( ) Short Term: Increase workloads from initial exercise prescription for resistance, speed, and METs.;Short Term: Perform resistance training exercises routinely during rehab and add in resistance training at home;Long Term: Improve cardiorespiratory fitness, muscular endurance and strength as measured by increased METs and  functional capacity ( ) Short Term: Increase workloads from initial exercise prescription for resistance, speed, and METs.;Short Term: Perform resistance training exercises routinely during rehab and add in resistance training at home;Long Term: Improve cardiorespiratory fitness, muscular endurance and strength as measured by increased METs and functional capacity ( ) Short Term: Increase workloads from initial exercise prescription for resistance, speed, and METs.;Short Term: Perform resistance training exercises routinely during rehab and add in resistance training at home;Long Term: Improve cardiorespiratory fitness, muscular endurance and strength as measured by increased METs and functional capacity ( )   Able to understand and use rate of perceived exertion (RPE) scale Yes Yes Yes Yes Yes   Intervention Provide education and explanation on how to use RPE scale Provide education and explanation on how to use RPE scale Provide education  and explanation on how to use RPE scale Provide education and explanation on how to use RPE scale Provide education and explanation on how to use RPE scale   Expected Outcomes Short Term: Able to use RPE daily in rehab to express subjective intensity level;Long Term:  Able to use RPE to guide intensity level when exercising independently Short Term: Able to use RPE daily in rehab to express subjective intensity level;Long Term:  Able to use RPE to guide intensity level when exercising independently Short Term: Able to use RPE daily in rehab to express subjective intensity level;Long Term:  Able to use RPE to guide intensity level when exercising independently Short Term: Able to use RPE daily in rehab to express subjective intensity level;Long Term:  Able to use RPE to guide intensity level when exercising independently Short Term: Able to use RPE daily in rehab to express subjective intensity level;Long Term:  Able to use RPE to guide intensity level when exercising  independently   Able to understand and use Dyspnea scale -- -- -- Yes Yes   Intervention -- -- -- Provide education and explanation on how to use Dyspnea scale Provide education and explanation on how to use Dyspnea scale   Expected Outcomes -- -- -- Short Term: Able to use Dyspnea scale daily in rehab to express subjective sense of shortness of breath during exertion;Long Term: Able to use Dyspnea scale to guide intensity level when exercising independently Short Term: Able to use Dyspnea scale daily in rehab to express subjective sense of shortness of breath during exertion;Long Term: Able to use Dyspnea scale to guide intensity level when exercising independently   Knowledge and understanding of Target Heart Rate Range (THRR) Yes Yes Yes Yes Yes   Intervention Provide education and explanation of THRR including how the numbers were predicted and where they are located for reference Provide education and explanation of THRR including how the numbers were predicted and where they are located for reference Provide education and explanation of THRR including how the numbers were predicted and where they are located for reference Provide education and explanation of THRR including how the numbers were predicted and where they are located for reference Provide education and explanation of THRR including how the numbers were predicted and where they are located for reference   Expected Outcomes Short Term: Able to state/look up THRR;Long Term: Able to use THRR to govern intensity when exercising independently;Short Term: Able to use daily as guideline for intensity in rehab Short Term: Able to state/look up THRR;Long Term: Able to use THRR to govern intensity when exercising independently;Short Term: Able to use daily as guideline for intensity in rehab Short Term: Able to state/look up THRR;Long Term: Able to use THRR to govern intensity when exercising independently;Short Term: Able to use daily as guideline for  intensity in rehab Short Term: Able to state/look up THRR;Long Term: Able to use THRR to govern intensity when exercising independently;Short Term: Able to use daily as guideline for intensity in rehab Short Term: Able to state/look up THRR;Long Term: Able to use THRR to govern intensity when exercising independently;Short Term: Able to use daily as guideline for intensity in rehab   Able to check pulse independently Yes Yes Yes Yes Yes   Intervention Provide education and demonstration on how to check pulse in carotid and radial arteries.;Review the importance of being able to check your own pulse for safety during independent exercise Provide education and demonstration on how to check pulse in carotid  and radial arteries.;Review the importance of being able to check your own pulse for safety during independent exercise Provide education and demonstration on how to check pulse in carotid and radial arteries.;Review the importance of being able to check your own pulse for safety during independent exercise Provide education and demonstration on how to check pulse in carotid and radial arteries.;Review the importance of being able to check your own pulse for safety during independent exercise Provide education and demonstration on how to check pulse in carotid and radial arteries.;Review the importance of being able to check your own pulse for safety during independent exercise   Expected Outcomes Short Term: Able to explain why pulse checking is important during independent exercise;Long Term: Able to check pulse independently and accurately Short Term: Able to explain why pulse checking is important during independent exercise;Long Term: Able to check pulse independently and accurately Short Term: Able to explain why pulse checking is important during independent exercise;Long Term: Able to check pulse independently and accurately Short Term: Able to explain why pulse checking is important during independent  exercise;Long Term: Able to check pulse independently and accurately Short Term: Able to explain why pulse checking is important during independent exercise;Long Term: Able to check pulse independently and accurately   Understanding of Exercise Prescription Yes Yes Yes Yes Yes   Intervention Provide education, explanation, and written materials on patient's individual exercise prescription Provide education, explanation, and written materials on patient's individual exercise prescription Provide education, explanation, and written materials on patient's individual exercise prescription Provide education, explanation, and written materials on patient's individual exercise prescription Provide education, explanation, and written materials on patient's individual exercise prescription   Expected Outcomes Short Term: Able to explain program exercise prescription;Long Term: Able to explain home exercise prescription to exercise independently Short Term: Able to explain program exercise prescription;Long Term: Able to explain home exercise prescription to exercise independently Short Term: Able to explain program exercise prescription;Long Term: Able to explain home exercise prescription to exercise independently Short Term: Able to explain program exercise prescription;Long Term: Able to explain home exercise prescription to exercise independently Short Term: Able to explain program exercise prescription;Long Term: Able to explain home exercise prescription to exercise independently            Exercise Goals Re-Evaluation:  Exercise Goals Re-Evaluation     Row Name 09/30/22 1555 10/28/22 1549 11/25/22 1530 12/24/22 1336       Exercise Goal Re-Evaluation   Exercise Goals Review Increase Physical Activity;Increase Strength and Stamina;Able to understand and use rate of perceived exertion (RPE) scale;Able to understand and use Dyspnea scale;Knowledge and understanding of Target Heart Rate Range  (THRR);Understanding of Exercise Prescription Increase Physical Activity;Increase Strength and Stamina;Able to understand and use rate of perceived exertion (RPE) scale;Knowledge and understanding of Target Heart Rate Range (THRR);Able to check pulse independently;Understanding of Exercise Prescription Increase Physical Activity;Increase Strength and Stamina;Able to understand and use rate of perceived exertion (RPE) scale;Able to understand and use Dyspnea scale;Knowledge and understanding of Target Heart Rate Range (THRR);Able to check pulse independently;Understanding of Exercise Prescription Increase Physical Activity;Increase Strength and Stamina;Able to understand and use rate of perceived exertion (RPE) scale;Knowledge and understanding of Target Heart Rate Range (THRR);Able to understand and use Dyspnea scale;Understanding of Exercise Prescription    Comments Pt has completed 4 sessions of PR. She is motivated to progress in the program. She is deconditioned, but she should be able to progress well. She is currently exercising at 1.7 METs on the stepper. Will  continue to monitor and progress as able. Pt has completed 10 sessions of PR. She is motivated top be in the program and is progressing. SHe recently hurt her R bicep area and has been using the stepper for both sessions during class. She is currently exerciisng at 1.8 METs on the stepper. Will continue to monitor and progress as able, Pt has completed 17 sessions of PR. She tolerates exercising well and has increased her workload on the stepper. She continues to only use the stepper for both sessions due to RUE pain (2/10) and also complains of LUE pain (4/10). She does not seem as motivated during class compaired to the start of coming. She is currently exercising at 1.9 METs on the stepper. Will continue to monitor and progress as able. Pt has completed 23 sessions of PR. She is tolerating exercise despite having BUE pain in both shoulders. She is  using the stepper but does not use the arms some days. She stated she was making an appt. with Ortho. about her shoulders. She does not seem to be benifiting from the program. She is currently exercising at 1.8 METs on the stepper. Will continue to monitor and progress as able.    Expected Outcomes Through exercise at rehab and at home, the patient will meet their stated goals. Through exercise at rehab and at home, the patient will meet their stated goals. Through exercise at rehab and at home, the patient will meet their stated goals. Through exercise at rehab and at home, the patient will meet their stated goals.             Nutrition & Weight - Outcomes:  Pre Biometrics - 09/17/22 1410       Pre Biometrics   Height 5\' 8"  (1.727 m)    Weight 259 lb 4.2 oz (117.6 kg)    Waist Circumference 52 inches    Hip Circumference 48 inches    Waist to Hip Ratio 1.08 %    BMI (Calculated) 39.43    Triceps Skinfold 40 mm    % Body Fat 52.6 %    Grip Strength 15 kg    Flexibility 0 in    Single Leg Stand 0 seconds             Post Biometrics - 01/20/23 1523        Post  Biometrics   Height 5\' 8"  (1.727 m)    Weight 257 lb 0.9 oz (116.6 kg)    Waist Circumference 52 inches    Hip Circumference 50 inches    Waist to Hip Ratio 1.04 %    BMI (Calculated) 39.09    Triceps Skinfold 38 mm    % Body Fat 52.1 %    Grip Strength 13 kg    Flexibility 0 in    Single Leg Stand 0 seconds             Nutrition:  Nutrition Therapy & Goals - 01/22/23 1502       Nutrition Therapy   RD appointment deferred Yes      Personal Nutrition Goals   Additional Goals? No    Comments Pt scored a 24 on her discharge MEDFICTs. She reports following at [facility] diet with low carbs. She deferred an RD referral which was offered by staff.      Intervention Plan   Intervention Nutrition handout(s) given to patient.    Expected Outcomes Short Term Goal: Understand basic principles of dietary content,  such as  calories, fat, sodium, cholesterol and nutrients.             Nutrition Discharge:  Nutrition Assessments - 01/22/23 1502       MEDFICTS Scores   Pre Score 36    Post Score 24    Score Difference -12             Education Questionnaire Score:  Knowledge Questionnaire Score - 01/22/23 1456       Knowledge Questionnaire Score   Pre Score 16/18    Post Score 17/18             Goals reviewed with patient; copy given to patient. Pt graduated from PR after 32 sessions. Her walk test distance decreased by 12.5%, and her MET level was 1.8 at discharge. Pt reports no specific plans to continue exercise post discharge. The importance of continual exercise has been stressed to pt by staff. Information regarding our maintenance program was provided.

## 2023-02-10 ENCOUNTER — Ambulatory Visit: Payer: Medicare PPO | Attending: Internal Medicine | Admitting: Internal Medicine

## 2023-02-10 ENCOUNTER — Encounter: Payer: Self-pay | Admitting: Internal Medicine

## 2023-02-10 DIAGNOSIS — E782 Mixed hyperlipidemia: Secondary | ICD-10-CM

## 2023-02-10 DIAGNOSIS — I5032 Chronic diastolic (congestive) heart failure: Secondary | ICD-10-CM | POA: Diagnosis not present

## 2023-02-10 DIAGNOSIS — I2510 Atherosclerotic heart disease of native coronary artery without angina pectoris: Secondary | ICD-10-CM

## 2023-02-10 DIAGNOSIS — E119 Type 2 diabetes mellitus without complications: Secondary | ICD-10-CM

## 2023-02-10 DIAGNOSIS — Z7984 Long term (current) use of oral hypoglycemic drugs: Secondary | ICD-10-CM

## 2023-02-10 DIAGNOSIS — I10 Essential (primary) hypertension: Secondary | ICD-10-CM

## 2023-02-10 DIAGNOSIS — I77810 Thoracic aortic ectasia: Secondary | ICD-10-CM

## 2023-02-10 MED ORDER — AMLODIPINE BESYLATE 2.5 MG PO TABS: ORAL_TABLET | ORAL | 0 refills | Status: DC

## 2023-02-10 NOTE — Patient Instructions (Signed)
Medication Instructions:  Your physician has recommended you make the following change in your medication:  DECREASE: amlodipine (Norvasc) to 2.5 mg by mouth once daily for 2 weeks then stop Please monitor your BP contact and let us know if BP increases  *If you need a refill on your cardiac medications before your next appointment, please call your pharmacy*   Lab Work: IN 3 WEEKS: BMP, BNP  If you have labs (blood work) drawn today and your tests are completely normal, you will receive your results only by: MyChart Message (if you have MyChart) OR A paper copy in the mail If you have any lab test that is abnormal or we need to change your treatment, we will call you to review the results.   Testing/Procedures: NONE   Follow-Up: At [facility], you and your health needs are our priority.  As part of our continuing mission to provide you with exceptional heart care, we have created designated Provider Care Teams.  These Care Teams include your primary Cardiologist (physician) and Advanced Practice Providers (APPs -  Physician Assistants and Nurse Practitioners) who all work together to provide you with the care you need, when you need it.    Your next appointment:   7-8 month(s)  Provider:   Christell Constant, MD  or Jari Favre, PA-C
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
## 2023-02-11 ENCOUNTER — Ambulatory Visit (INDEPENDENT_AMBULATORY_CARE_PROVIDER_SITE_OTHER): Payer: Medicare PPO | Admitting: Internal Medicine

## 2023-02-11 ENCOUNTER — Encounter: Payer: Self-pay | Admitting: Internal Medicine

## 2023-02-11 VITALS — BP 127/74 | HR 92 | Ht 68.0 in | Wt 252.2 lb

## 2023-02-11 DIAGNOSIS — E1159 Type 2 diabetes mellitus with other circulatory complications: Secondary | ICD-10-CM | POA: Diagnosis not present

## 2023-02-11 DIAGNOSIS — I152 Hypertension secondary to endocrine disorders: Secondary | ICD-10-CM

## 2023-02-11 DIAGNOSIS — E1165 Type 2 diabetes mellitus with hyperglycemia: Secondary | ICD-10-CM

## 2023-02-11 DIAGNOSIS — E039 Hypothyroidism, unspecified: Secondary | ICD-10-CM

## 2023-02-11 MED ORDER — LEVOTHYROXINE SODIUM 175 MCG PO TABS: ORAL_TABLET | ORAL | 1 refills | Status: DC

## 2023-02-11 NOTE — Patient Instructions (Signed)
It was a pleasure to see you today.  Thank you for giving [place] the opportunity to be involved in your care.  Below is a brief recap of your visit and next steps.  We will plan to see you again in 3 months.  Summary Levothyroxine switched to 175 mcg tablet with taking half a tablet two days of the week Please let us know about Mounjaro vs Ozempic Follow up in 3 months
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
## 2023-02-18 ENCOUNTER — Ambulatory Visit (INDEPENDENT_AMBULATORY_CARE_PROVIDER_SITE_OTHER): Payer: Medicare PPO

## 2023-02-18 VITALS — Ht 68.0 in | Wt 252.0 lb

## 2023-02-18 DIAGNOSIS — Z1231 Encounter for screening mammogram for malignant neoplasm of breast: Secondary | ICD-10-CM

## 2023-02-18 DIAGNOSIS — Z0000 Encounter for general adult medical examination without abnormal findings: Secondary | ICD-10-CM | POA: Diagnosis not present
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
## 2023-02-18 NOTE — Patient Instructions (Addendum)
Julie Bean , Thank you for taking time to come for your Medicare Wellness Visit. I appreciate your ongoing commitment to your health goals. Please review the following plan we discussed and let me know if I can assist you in the future.   These are the goals we discussed:  Goals      DIET - EAT MORE FRUITS AND VEGETABLES     Wants to manage her diabetes better.     Patient Stated     She wants to improved at water coloring, a recent hobby she started.         This is a list of the screening recommended for you and due dates:  Health Maintenance  Topic Date Due   Hepatitis C Screening  Never done   Zoster (Shingles) Vaccine (1 of 2) Never done   COVID-19 Vaccine (6 - 2023-24 season) 10/22/2022   Complete foot exam   11/06/2022   Yearly kidney health urinalysis for diabetes  03/13/2023   Mammogram  03/30/2023   Flu Shot  04/02/2023   Hemoglobin A1C  06/25/2023   Eye exam for diabetics  07/17/2023   Yearly kidney function blood test for diabetes  09/06/2023   Medicare Annual Wellness Visit  02/18/2024   DTaP/Tdap/Td vaccine (2 - Td or Tdap) 12/08/2030   Colon Cancer Screening  06/29/2031   Pneumonia Vaccine  Completed   DEXA scan (bone density measurement)  Completed   HPV Vaccine  Aged Out    Advanced directives: We have a copy in your chart  Conditions/risks identified: You have an order for:  []   2D Mammogram  [x]   3D Mammogram  []   Bone Density     Please call for appointment:   [facility] [employer] [facility] [phone]  [facility]  [address] [place] [address] [phone]  [facility] [address] [place], [place] [address] [phone]  [facility] [address] Geanie Logan [place], [place] [address] [phone]  [facility] [address] [place], [place]  [address] [phone]  [facility] [address] [place], [place] [address] [phone]  [facility] [place] [address] [place], [place] [address] [phone]  [facility] [place] [facility] [address] [place], [place] [address] [phone]  [facility] [address] [place], [place] [address] [phone]  [facility] [address] [place], [place] [address] [phone]  [facility] [address] [place], [place] [address] [phone]  [facility] [address] [place], [place] [address] [phone]  [facility] [address] [place], [place] [address] [phone]  [facility] [place] [address] [place], [place] [address] [phone]  [facility] [address] [place], [place] [address] [phone] [facility]  [facility] [address] [place], [place] [address] [phone]  [facility] [address], [place] [address] [phone]   Make sure to wear two-piece clothing.  No lotions powders or deodorants the day of the appointment Make sure to bring picture ID and insurance card.  Bring list of medications you are  currently taking including any supplements.   Schedule your [place] screening mammogram through MyChart!   Log into your MyChart account.  Go to 'Visit' (or 'Appointments' if on mobile App) --> Schedule an Appointment  Under 'Select a Reason for Visit' choose the Mammogram Screening option.  Complete the pre-visit questions and select the time and place that best fits your schedule.  Managing Pain Without  Opioids Opioids are strong medicines used to treat moderate to severe pain. For some people, especially those who have long-term (chronic) pain, opioids may not be the best choice for pain management due to: Side effects like nausea, constipation, and sleepiness. The risk of addiction (opioid use disorder). The longer you take opioids, the greater your risk of addiction. Pain that lasts for more than 3 months is called chronic pain. Managing chronic pain usually requires more than one approach and is often provided by a team of health care providers working together (multidisciplinary approach). Pain management may be done at a pain management center or pain clinic. How to manage pain without the use of opioids Use non-opioid medicines Non-opioid medicines for pain may include: Over-the-counter or prescription non-steroidal anti-inflammatory drugs (NSAIDs). These may be the first medicines used for pain. They work well for muscle and bone pain, and they reduce swelling. Acetaminophen. This over-the-counter medicine may work well for milder pain but not swelling. Antidepressants. These may be used to treat chronic pain. A certain type of antidepressant (tricyclics) is often used. These medicines are given in lower doses for pain than when used for depression. Anticonvulsants. These are usually used to treat seizures but may also reduce nerve (neuropathic) pain. Muscle relaxants. These relieve pain caused by sudden muscle tightening (spasms). You may also use a pain medicine that is applied to the skin as a patch, cream, or gel (topical analgesic), such as a numbing medicine. These may cause fewer side effects than medicines taken by mouth. Do certain therapies as directed Some therapies can help with pain management. They include: Physical therapy. You will do exercises to gain strength and flexibility. A physical therapist may teach you exercises to move and stretch parts of your body that are weak,  stiff, or painful. You can learn these exercises at physical therapy visits and practice them at home. Physical therapy may also involve: Massage. Heat wraps or applying heat or cold to affected areas. Electrical signals that interrupt pain signals (transcutaneous electrical nerve stimulation, TENS). Weak lasers that reduce pain and swelling (low-level laser therapy). Signals from your body that help you learn to regulate pain (biofeedback). Occupational therapy. This helps you to learn ways to function at home and work with less pain. Recreational therapy. This involves trying new activities or hobbies, such as a physical activity or drawing. Mental health therapy, including: Cognitive behavioral therapy (CBT). This helps you learn coping skills for dealing with pain. Acceptance and commitment therapy (ACT) to change the way you think and react to pain. Relaxation therapies, including muscle relaxation exercises and mindfulness-based stress reduction. Pain management counseling. This may be individual, family, or group counseling.  Receive medical treatments Medical treatments for pain management include: Nerve block injections. These may include a pain blocker and anti-inflammatory medicines. You may have injections: Near the spine to relieve chronic back or neck pain. Into joints to relieve back or joint pain. Into nerve areas that supply a painful area to relieve body pain. Into muscles (trigger point injections) to relieve some painful muscle conditions. A medical device placed near  your spine to help block pain signals and relieve nerve pain or chronic back pain (spinal cord stimulation device). Acupuncture. Follow these instructions at home Medicines Take over-the-counter and prescription medicines only as told by your health care provider. If you are taking pain medicine, ask your health care providers about possible side effects to watch out for. Do not drive or use heavy  machinery while taking prescription opioid pain medicine. Lifestyle  Do not use drugs or alcohol to reduce pain. If you drink alcohol, limit how much you have to: 0-1 drink a day for women who are not pregnant. 0-2 drinks a day for men. Know how much alcohol is in a drink. In the [place] one drink equals one 12 oz bottle of beer (355 mL), one 5 oz glass of wine (148 mL), or one 1 oz glass of hard liquor (44 mL). Do not use any products that contain nicotine or tobacco. These products include cigarettes, chewing tobacco, and vaping devices, such as e-cigarettes. If you need help quitting, ask your health care provider. Eat a healthy diet and maintain a healthy weight. Poor diet and excess weight may make pain worse. Eat foods that are high in fiber. These include fresh fruits and vegetables, whole grains, and beans. Limit foods that are high in fat and processed sugars, such as fried and sweet foods. Exercise regularly. Exercise lowers stress and may help relieve pain. Ask your health care provider what activities and exercises are safe for you. If your health care provider approves, join an exercise class that combines movement and stress reduction. Examples include yoga and tai chi. Get enough sleep. Lack of sleep may make pain worse. Lower stress as much as possible. Practice stress reduction techniques as told by your therapist. General instructions Work with all your pain management providers to find the treatments that work best for you. You are an important member of your pain management team. There are many things you can do to reduce pain on your own. Consider joining an online or in-person support group for people who have chronic pain. Keep all follow-up visits. This is important. Where to find more information You can find more information about managing pain without opioids from: American Academy of Pain Medicine: painmed.org Institute for Chronic Pain:  instituteforchronicpain.org American Chronic Pain Association: theacpa.org Contact a health care provider if: You have side effects from pain medicine. Your pain gets worse or does not get better with treatments or home therapy. You are struggling with anxiety or depression. Summary Many types of pain can be managed without opioids. Chronic pain may respond better to pain management without opioids. Pain is best managed when you and a team of health care providers work together. Pain management without opioids may include non-opioid medicines, medical treatments, physical therapy, mental health therapy, and lifestyle changes. Tell your health care providers if your pain gets worse or is not being managed well enough. This information is not intended to replace advice given to you by your health care provider. Make sure you discuss any questions you have with your health care provider. Document Revised: 11/28/2020 Document Reviewed: 11/28/2020 Elsevier Patient Education  2024 Elsevier Inc.   Next appointment:  Follow up in one year for your annual wellness visit  March 16, 2024 at 2:30pm telephone visit   Preventive Care 65 Years and Older, Female Preventive care refers to lifestyle choices and visits with your health care provider that can promote health and wellness. What does preventive care include? A  yearly physical exam. This is also called an annual well check. Dental exams once or twice a year. Routine eye exams. Ask your health care provider how often you should have your eyes checked. Personal lifestyle choices, including: Daily care of your teeth and gums. Regular physical activity. Eating a healthy diet. Avoiding tobacco and drug use. Limiting alcohol use. Practicing safe sex. Taking low-dose aspirin every day. Taking vitamin and mineral supplements as recommended by your health care provider. What happens during an annual well check? The services and screenings done  by your health care provider during your annual well check will depend on your age, overall health, lifestyle risk factors, and family history of disease. Counseling  Your health care provider may ask you questions about your: Alcohol use. Tobacco use. Drug use. Emotional well-being. Home and relationship well-being. Sexual activity. Eating habits. History of falls. Memory and ability to understand (cognition). Work and work Astronomer. Reproductive health. Screening  You may have the following tests or measurements: Height, weight, and BMI. Blood pressure. Lipid and cholesterol levels. These may be checked every 5 years, or more frequently if you are over 50 years old. Skin check. Lung cancer screening. You may have this screening every year starting at age 27 if you have a 30-pack-year history of smoking and currently smoke or have quit within the past 15 years. Fecal occult blood test (FOBT) of the stool. You may have this test every year starting at age 53. Flexible sigmoidoscopy or colonoscopy. You may have a sigmoidoscopy every 5 years or a colonoscopy every 10 years starting at age 53. Hepatitis C blood test. Hepatitis B blood test. Sexually transmitted disease (STD) testing. Diabetes screening. This is done by checking your blood sugar (glucose) after you have not eaten for a while (fasting). You may have this done every 1-3 years. Bone density scan. This is done to screen for osteoporosis. You may have this done starting at age 56. Mammogram. This may be done every 1-2 years. Talk to your health care provider about how often you should have regular mammograms. Talk with your health care provider about your test results, treatment options, and if necessary, the need for more tests. Vaccines  Your health care provider may recommend certain vaccines, such as: Influenza vaccine. This is recommended every year. Tetanus, diphtheria, and acellular pertussis (Tdap, Td) vaccine. You  may need a Td booster every 10 years. Zoster vaccine. You may need this after age 79. Pneumococcal 13-valent conjugate (PCV13) vaccine. One dose is recommended after age 65. Pneumococcal polysaccharide (PPSV23) vaccine. One dose is recommended after age 66. Talk to your health care provider about which screenings and vaccines you need and how often you need them. This information is not intended to replace advice given to you by your health care provider. Make sure you discuss any questions you have with your health care provider. Document Released: 09/14/2015 Document Revised: 05/07/2016 Document Reviewed: 06/19/2015 Elsevier Interactive Patient Education  2017 [employer].  Fall Prevention in the Home Falls can cause injuries. They can happen to people of all ages. There are many things you can do to make your home safe and to help prevent falls. What can I do on the outside of my home? Regularly fix the edges of walkways and driveways and fix any cracks. Remove anything that might make you trip as you walk through a door, such as a raised step or threshold. Trim any bushes or trees on the path to your home. Use bright  outdoor lighting. Clear any walking paths of anything that might make someone trip, such as rocks or tools. Regularly check to see if handrails are loose or broken. Make sure that both sides of any steps have handrails. Any raised decks and porches should have guardrails on the edges. Have any leaves, snow, or ice cleared regularly. Use sand or salt on walking paths during winter. Clean up any spills in your garage right away. This includes oil or grease spills. What can I do in the bathroom? Use night lights. Install grab bars by the toilet and in the tub and shower. Do not use towel bars as grab bars. Use non-skid mats or decals in the tub or shower. If you need to sit down in the shower, use a plastic, non-slip stool. Keep the floor dry. Clean up any water that spills  on the floor as soon as it happens. Remove soap buildup in the tub or shower regularly. Attach bath mats securely with double-sided non-slip rug tape. Do not have throw rugs and other things on the floor that can make you trip. What can I do in the bedroom? Use night lights. Make sure that you have a light by your bed that is easy to reach. Do not use any sheets or blankets that are too big for your bed. They should not hang down onto the floor. Have a firm chair that has side arms. You can use this for support while you get dressed. Do not have throw rugs and other things on the floor that can make you trip. What can I do in the kitchen? Clean up any spills right away. Avoid walking on wet floors. Keep items that you use a lot in easy-to-reach places. If you need to reach something above you, use a strong step stool that has a grab bar. Keep electrical cords out of the way. Do not use floor polish or wax that makes floors slippery. If you must use wax, use non-skid floor wax. Do not have throw rugs and other things on the floor that can make you trip. What can I do with my stairs? Do not leave any items on the stairs. Make sure that there are handrails on both sides of the stairs and use them. Fix handrails that are broken or loose. Make sure that handrails are as long as the stairways. Check any carpeting to make sure that it is firmly attached to the stairs. Fix any carpet that is loose or worn. Avoid having throw rugs at the top or bottom of the stairs. If you do have throw rugs, attach them to the floor with carpet tape. Make sure that you have a light switch at the top of the stairs and the bottom of the stairs. If you do not have them, ask someone to add them for you. What else can I do to help prevent falls? Wear shoes that: Do not have high heels. Have rubber bottoms. Are comfortable and fit you well. Are closed at the toe. Do not wear sandals. If you use a stepladder: Make  sure that it is fully opened. Do not climb a closed stepladder. Make sure that both sides of the stepladder are locked into place. Ask someone to hold it for you, if possible. Clearly mark and make sure that you can see: Any grab bars or handrails. First and last steps. Where the edge of each step is. Use tools that help you move around (mobility aids) if they are needed. These  include: Canes. Walkers. Scooters. Crutches. Turn on the lights when you go into a dark area. Replace any light bulbs as soon as they burn out. Set up your furniture so you have a clear path. Avoid moving your furniture around. If any of your floors are uneven, fix them. If there are any pets around you, be aware of where they are. Review your medicines with your doctor. Some medicines can make you feel dizzy. This can increase your chance of falling. Ask your doctor what other things that you can do to help prevent falls. This information is not intended to replace advice given to you by your health care provider. Make sure you discuss any questions you have with your health care provider. Document Released: 06/14/2009 Document Revised: 01/24/2016 Document Reviewed: 09/22/2014 Elsevier Interactive Patient Education  2017 [employer].

## 2023-02-18 NOTE — Assessment & Plan Note (Signed)
A1c 8.2 on labs from April.  She is currently prescribed glipizide 10 mg daily, Farxiga 10 mg daily, and metformin 500 mg 3 times daily.  She reports that blood sugars are overall improving. -No medication changes today.  We discussed GLP-1 therapy.  She will consider Ozempic vs Mounjaro and let us know if she is interested in starting 1 of these medications. -Repeat A1c at follow-up in 3 months -States that she will have a diabetic foot exam performed tomorrow (6/13).

## 2023-02-18 NOTE — Assessment & Plan Note (Signed)
BP is adequately controlled.  She is currently prescribed amlodipine 2.5 mg daily and metoprolol succinate 25 mg daily.  She reports today that she is titrating off of amlodipine as directed by cardiology.

## 2023-02-18 NOTE — Assessment & Plan Note (Signed)
TFTs WNL and symptoms well-controlled with alternating doses of 150/175 mcg of levothyroxine.  Her acute concern today is issues with medication refills given the alternating doses.  With simplicity and mine, she would prefer to switch to taking levothyroxine 175 mcg daily and taking half a tablet 2 days of the week.  Her weekly total dose of levothyroxine is essentially the same as alternating doses of 150/175 mcg. -New prescription written today for levothyroxine 175 mcg to be taken daily except on Mondays and Fridays on which days she will take half a tablet (87.5 mcg). -Repeat TFTs at follow-up in 3 months

## 2023-03-24 ENCOUNTER — Encounter: Payer: Self-pay | Admitting: Internal Medicine

## 2023-04-01 ENCOUNTER — Ambulatory Visit
Admission: RE | Admit: 2023-04-01 | Discharge: 2023-04-01 | Disposition: A | Discharge status: Home / Self Care | Payer: Medicare PPO | Source: Ambulatory Visit | Attending: Internal Medicine | Admitting: Internal Medicine

## 2023-04-01 DIAGNOSIS — Z0000 Encounter for general adult medical examination without abnormal findings: Secondary | ICD-10-CM

## 2023-04-01 DIAGNOSIS — Z1231 Encounter for screening mammogram for malignant neoplasm of breast: Secondary | ICD-10-CM

## 2023-04-02 DIAGNOSIS — I2510 Atherosclerotic heart disease of native coronary artery without angina pectoris: Secondary | ICD-10-CM | POA: Diagnosis not present

## 2023-04-02 DIAGNOSIS — E119 Type 2 diabetes mellitus without complications: Secondary | ICD-10-CM | POA: Diagnosis not present

## 2023-04-02 DIAGNOSIS — I77810 Thoracic aortic ectasia: Secondary | ICD-10-CM | POA: Diagnosis not present

## 2023-04-02 DIAGNOSIS — E782 Mixed hyperlipidemia: Secondary | ICD-10-CM | POA: Diagnosis not present

## 2023-04-02 DIAGNOSIS — I5032 Chronic diastolic (congestive) heart failure: Secondary | ICD-10-CM | POA: Diagnosis not present

## 2023-04-02 DIAGNOSIS — I10 Essential (primary) hypertension: Secondary | ICD-10-CM | POA: Diagnosis not present

## 2023-04-07 ENCOUNTER — Other Ambulatory Visit: Payer: Self-pay | Admitting: Internal Medicine

## 2023-04-07 DIAGNOSIS — E1165 Type 2 diabetes mellitus with hyperglycemia: Secondary | ICD-10-CM

## 2023-04-07 MED ORDER — GLIPIZIDE ER 10 MG PO TB24
ORAL_TABLET | ORAL | 0 refills | Status: DC
Start: 2023-04-07 — End: 2023-05-21

## 2023-04-08 ENCOUNTER — Ambulatory Visit (HOSPITAL_COMMUNITY)
Admission: RE | Admit: 2023-04-08 | Discharge: 2023-04-08 | Disposition: A | Discharge status: Home / Self Care | Payer: Medicare PPO | Source: Ambulatory Visit | Attending: Internal Medicine | Admitting: Internal Medicine

## 2023-04-08 DIAGNOSIS — I5032 Chronic diastolic (congestive) heart failure: Secondary | ICD-10-CM | POA: Diagnosis not present

## 2023-04-08 LAB — ECHOCARDIOGRAM COMPLETE
Area-P 1/2: 3.28 cm2
MV M vel: 2.65 m/s
MV Peak grad: 28 mmHg
MV VTI: 2.06 cm2
S' Lateral: 3.4 cm

## 2023-04-08 NOTE — Progress Notes (Signed)
  Echocardiogram 2D Echocardiogram has been performed.  Julie Bean 04/08/2023, 10:27 AM

## 2023-05-19 ENCOUNTER — Encounter: Payer: Self-pay | Admitting: Internal Medicine

## 2023-05-19 MED ORDER — TORSEMIDE 20 MG PO TABS: 40.0000 mg | ORAL_TABLET | Freq: Every day | ORAL | 3 refills | Status: DC

## 2023-05-21 ENCOUNTER — Encounter: Payer: Self-pay | Admitting: Internal Medicine

## 2023-05-21 ENCOUNTER — Ambulatory Visit: Payer: Medicare PPO | Admitting: Internal Medicine

## 2023-05-21 VITALS — BP 129/76 | HR 83 | Ht 68.0 in | Wt 253.2 lb

## 2023-05-21 DIAGNOSIS — M4810 Ankylosing hyperostosis [Forestier], site unspecified: Secondary | ICD-10-CM | POA: Diagnosis not present

## 2023-05-21 DIAGNOSIS — N1831 Chronic kidney disease, stage 3a: Secondary | ICD-10-CM

## 2023-05-21 DIAGNOSIS — E039 Hypothyroidism, unspecified: Secondary | ICD-10-CM

## 2023-05-21 DIAGNOSIS — G8929 Other chronic pain: Secondary | ICD-10-CM | POA: Diagnosis not present

## 2023-05-21 DIAGNOSIS — E1165 Type 2 diabetes mellitus with hyperglycemia: Secondary | ICD-10-CM

## 2023-05-21 DIAGNOSIS — K219 Gastro-esophageal reflux disease without esophagitis: Secondary | ICD-10-CM

## 2023-05-21 DIAGNOSIS — E1159 Type 2 diabetes mellitus with other circulatory complications: Secondary | ICD-10-CM

## 2023-05-21 DIAGNOSIS — I10 Essential (primary) hypertension: Secondary | ICD-10-CM

## 2023-05-21 DIAGNOSIS — Z7984 Long term (current) use of oral hypoglycemic drugs: Secondary | ICD-10-CM

## 2023-05-21 DIAGNOSIS — I152 Hypertension secondary to endocrine disorders: Secondary | ICD-10-CM

## 2023-05-21 DIAGNOSIS — E782 Mixed hyperlipidemia: Secondary | ICD-10-CM | POA: Diagnosis not present

## 2023-05-21 DIAGNOSIS — L821 Other seborrheic keratosis: Secondary | ICD-10-CM

## 2023-05-21 MED ORDER — METFORMIN HCL 500 MG PO TABS
500.0000 mg | ORAL_TABLET | Freq: Three times a day (TID) | ORAL | 3 refills | Status: DC
Start: 2023-05-21 — End: 2023-07-15

## 2023-05-21 MED ORDER — INDOMETHACIN 50 MG PO CAPS
50.0000 mg | ORAL_CAPSULE | Freq: Two times a day (BID) | ORAL | 3 refills | Status: DC
Start: 2023-05-21 — End: 2024-04-06

## 2023-05-21 MED ORDER — OMEPRAZOLE 40 MG PO CPDR
40.0000 mg | DELAYED_RELEASE_CAPSULE | Freq: Every day | ORAL | 3 refills | Status: DC
Start: 2023-05-21 — End: 2024-04-06

## 2023-05-21 MED ORDER — GLIPIZIDE ER 10 MG PO TB24
ORAL_TABLET | ORAL | 3 refills | Status: DC
Start: 2023-05-21 — End: 2024-04-06

## 2023-05-21 MED ORDER — LEVOTHYROXINE SODIUM 175 MCG PO TABS
ORAL_TABLET | ORAL | 1 refills | Status: DC
Start: 2023-05-21 — End: 2023-11-16

## 2023-05-21 MED ORDER — METOPROLOL SUCCINATE ER 25 MG PO TB24
25.0000 mg | ORAL_TABLET | Freq: Every day | ORAL | 3 refills | Status: DC
Start: 2023-05-21 — End: 2024-04-06

## 2023-05-21 NOTE — Patient Instructions (Signed)
It was a pleasure to see you today.  Thank you for giving [place] the opportunity to be involved in your care.  Below is a brief recap of your visit and next steps.  We will plan to see you again in 6 months.  Summary No medication changes today. Refills provided Repeat thyroid studies and A1c Follow up in 6 months
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
## 2023-05-22 ENCOUNTER — Ambulatory Visit: Payer: Medicare PPO | Admitting: Internal Medicine

## 2023-05-22 LAB — TSH+FREE T4
Free T4: 1.36 ng/dL (ref 0.82–1.77)
TSH: 3.1 u[IU]/mL (ref 0.450–4.500)

## 2023-05-22 LAB — HEMOGLOBIN A1C
Est. average glucose Bld gHb Est-mCnc: 183 mg/dL
Hgb A1c MFr Bld: 8 % — ABNORMAL HIGH (ref 4.8–5.6)

## 2023-05-24 ENCOUNTER — Encounter: Payer: Self-pay | Admitting: Internal Medicine

## 2023-05-25 ENCOUNTER — Other Ambulatory Visit: Payer: Self-pay

## 2023-05-25 DIAGNOSIS — E785 Hyperlipidemia, unspecified: Secondary | ICD-10-CM

## 2023-05-25 MED ORDER — ROSUVASTATIN CALCIUM 10 MG PO TABS: 10.0000 mg | ORAL_TABLET | Freq: Every day | ORAL | 3 refills | Status: DC

## 2023-05-26 ENCOUNTER — Encounter: Payer: Self-pay | Admitting: Internal Medicine

## 2023-05-27 ENCOUNTER — Encounter: Payer: Self-pay | Admitting: Internal Medicine

## 2023-05-27 DIAGNOSIS — L821 Other seborrheic keratosis: Secondary | ICD-10-CM | POA: Insufficient documentation

## 2023-05-27 NOTE — Assessment & Plan Note (Addendum)
A1c 8.2 on labs from April.  She is currently prescribed glipizide 10 mg daily, Farxiga 10 mg daily, and metformin 500 mg 3 times daily. -Repeat A1c ordered today

## 2023-05-27 NOTE — Assessment & Plan Note (Signed)
Her acute concern today is requesting reevaluation of facial lesions noted on each cheek.  She has previously been told these are SKs.  They temporarily decreased in size while taking Valtrex last fall and have since increased in size.  On exam today, their appearance remains consistent with seborrheic keratoses.

## 2023-05-27 NOTE — Assessment & Plan Note (Signed)
 Remains adequately controlled on current antihypertensive regimen.  No medication changes are indicated today.

## 2023-05-27 NOTE — Assessment & Plan Note (Signed)
Followed by nephrology.  Renal function stable on labs from August.

## 2023-05-27 NOTE — Assessment & Plan Note (Signed)
Levothyroxine was switched to 175 mcg daily except on Mondays and Fridays on which days she takes half a tablet (87.5 mcg).  She remains asymptomatic. -Repeat thyroid studies ordered today

## 2023-05-27 NOTE — Assessment & Plan Note (Signed)
Lipid panel last updated in November 2023.  Total cholesterol 184 and LDL 68.  She is currently prescribed rosuvastatin 10 mg daily.  Refill requested today. Repeat lipid panel-at follow-up in 6 months

## 2023-07-14 ENCOUNTER — Encounter: Payer: Self-pay | Admitting: Internal Medicine

## 2023-07-14 DIAGNOSIS — E1165 Type 2 diabetes mellitus with hyperglycemia: Secondary | ICD-10-CM

## 2023-07-15 MED ORDER — METFORMIN HCL 500 MG PO TABS: 1000.0000 mg | ORAL_TABLET | Freq: Two times a day (BID) | ORAL | 1 refills | Status: DC

## 2023-09-01 ENCOUNTER — Ambulatory Visit
Admission: RE | Admit: 2023-09-01 | Discharge: 2023-09-01 | Disposition: A | Discharge status: Home / Self Care | Payer: Medicare PPO | Source: Ambulatory Visit | Attending: Internal Medicine | Admitting: Internal Medicine

## 2023-09-01 VITALS — BP 138/78 | HR 89 | Temp 98.8°F | Resp 18

## 2023-09-01 DIAGNOSIS — R600 Localized edema: Secondary | ICD-10-CM | POA: Diagnosis not present
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
## 2023-09-01 NOTE — ED Triage Notes (Signed)
Blister on left lower leg since Friday.

## 2023-09-01 NOTE — Discharge Instructions (Signed)
 Increase the Torsemide  dose to 60 mg daily for the next 5 days. Continue all of your current medications. Elevate the lower extremities above the level of the heart is much as possible while symptoms persist. Please call to schedule a follow-up appointment with your PCP within the next 7 to 10 days. Go to the emergency department immediately if you experience worsening lower extremity edema swelling, shortness of breath, difficulty breathing, or chest pain. Follow-up as needed.

## 2023-09-04 ENCOUNTER — Ambulatory Visit: Payer: Medicare PPO | Attending: Physician Assistant | Admitting: Physician Assistant

## 2023-09-04 ENCOUNTER — Other Ambulatory Visit: Payer: Self-pay | Admitting: *Deleted

## 2023-09-04 ENCOUNTER — Encounter: Payer: Self-pay | Admitting: Physician Assistant

## 2023-09-04 VITALS — BP 118/72 | HR 79 | Resp 12 | Ht 68.0 in | Wt 257.8 lb

## 2023-09-04 DIAGNOSIS — R0609 Other forms of dyspnea: Secondary | ICD-10-CM

## 2023-09-04 DIAGNOSIS — I77810 Thoracic aortic ectasia: Secondary | ICD-10-CM

## 2023-09-04 DIAGNOSIS — I2510 Atherosclerotic heart disease of native coronary artery without angina pectoris: Secondary | ICD-10-CM

## 2023-09-04 DIAGNOSIS — I5033 Acute on chronic diastolic (congestive) heart failure: Secondary | ICD-10-CM

## 2023-09-04 NOTE — Patient Instructions (Signed)
 Medication Instructions:  Your physician recommends that you continue on your current medications as directed. Please refer to the Current Medication list given to you today.  Go back to Torsemide  20 taking 2 back on Tuesday 09/08/23  *If you need a refill on your cardiac medications before your next appointment, please call your pharmacy*   Lab Work: NEXT WEEK: GO TO A [facility], FASTING, FOR:  CMET & LIPID  If you have labs (blood work) drawn today and your tests are completely normal, you will receive your results only by: MyChart Message (if you have MyChart) OR A paper copy in the mail If you have any lab test that is abnormal or we need to change your treatment, we will call you to review the results.   Testing/Procedures: None ordered   Follow-Up: At [facility], you and your health needs are our priority.  As part of our continuing mission to provide you with exceptional heart care, we have created designated Provider Care Teams.  These Care Teams include your primary Cardiologist (physician) and Advanced Practice Providers (APPs -  Physician Assistants and Nurse Practitioners) who all work together to provide you with the care you need, when you need it.  We recommend signing up for the patient portal called MyChart.  Sign up information is provided on this After Visit Summary.  MyChart is used to connect with patients for Virtual Visits (Telemedicine).  Patients are able to view lab/test results, encounter notes, upcoming appointments, etc.  Non-urgent messages can be sent to your provider as well.   To learn more about what you can do with MyChart, go to [email or web address].    Your next appointment:   3 month(s)  Provider:   Stanly DELENA Leavens, MD     Other Instructions

## 2023-09-04 NOTE — Progress Notes (Signed)
 Cardiology Office Note:  .   Date:  09/04/2023  ID:  [name], DOB Oct 02, 1950, MRN [id number] PCP: Melvenia Manus BRAVO, MD  [facility] Providers Cardiologist:  Stanly DELENA Leavens, MD {  History of Present Illness: Julie   Lestine Bean is a 73 y.o. female with a history of DM, HLD with DM, chronic hyponatremia, hyperkalemia, morbid obesity here for follow-up appointment from her ED visit 12/31.  Has a history of lower extremity edema.  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  Started on SGLT2 inhibitor.  CCTA with mild nonobstructive disease.  This year, she did worse with pulmonary rehab.  Unfortunately, it was minimally helpful.  Education was all aimed at home oxygen.  Had some improvement on eating a heart healthy diet.  No chest pressure or pain at her last office visit 02/10/2023.  No palpitations or syncope.  Had some balance issues that limited her activity.  Was fortunately on room air.  No change in leg edema.  Today, she presents with a history of heart disease with concerns about fluid retention and swelling in the legs and feet. The patient admits to not taking her diuretic for four days during a road trip, which led to the fluid retention. The patient reports that the swelling has improved slightly since resuming and increasing the dosage of the diuretic, but is still present. She also presents with a fluid pocket on the leg, which was assessed at an urgent care center. The patient is also in need of a new prescription for Farxiga , which she receives through a patient assistance program. The patient also mentions a recent bout of laryngitis, but does not express concern about it  Reports no shortness of breath nor dyspnea on exertion. Reports no chest pain, pressure, or tightness. No orthopnea, PND. Reports no palpitations.   Discussed the use of AI scribe software for clinical note transcription with the patient, who gave verbal consent to  proceed.  ROS: pertinent ROS in HPI  Studies Reviewed: .       Echo 04/08/23 IMPRESSIONS     1. Left ventricular ejection fraction, by estimation, is 60 to 65%. The  left ventricle has normal function. The left ventricle has no regional  wall motion abnormalities. Left ventricular diastolic parameters are  consistent with Grade I diastolic  dysfunction (impaired relaxation).   2. Right ventricular systolic function is normal. The right ventricular  size is normal. There is normal pulmonary artery systolic pressure.   3. The mitral valve is normal in structure. Trivial mitral valve  regurgitation. No evidence of mitral stenosis.   4. The aortic valve is tricuspid. There is mild thickening of the aortic  valve. Aortic valve regurgitation is not visualized. Aortic valve  sclerosis/calcification is present, without any evidence of aortic  stenosis.   5. Aortic dilatation noted. There is borderline dilatation of the aortic  root, measuring 37 mm. There is mild dilatation of the ascending aorta,  measuring 43 mm.   6. The inferior vena cava is normal in size with greater than 50%  respiratory variability, suggesting right atrial pressure of 3 mmHg.   Comparison(s): No significant change from prior study.   FINDINGS   Left Ventricle: Left ventricular ejection fraction, by estimation, is 60  to 65%. The left ventricle has normal function. The left ventricle has no  regional wall motion abnormalities. The left ventricular internal cavity  size was normal in size. There is   no left ventricular hypertrophy. Left  ventricular diastolic parameters  are consistent with Grade I diastolic dysfunction (impaired relaxation).   Right Ventricle: The right ventricular size is normal. No increase in  right ventricular wall thickness. Right ventricular systolic function is  normal. There is normal pulmonary artery systolic pressure. The tricuspid  regurgitant velocity is 0.94 m/s, and   with an  assumed right atrial pressure of 3 mmHg, the estimated right  ventricular systolic pressure is 6.5 mmHg.   Left Atrium: Left atrial size was normal in size.   Right Atrium: Right atrial size was normal in size.   Pericardium: There is no evidence of pericardial effusion.   Mitral Valve: The mitral valve is normal in structure. Trivial mitral  valve regurgitation. No evidence of mitral valve stenosis. MV peak  gradient, 3.5 mmHg. The mean mitral valve gradient is 1.0 mmHg.   Tricuspid Valve: The tricuspid valve is not well visualized. Tricuspid  valve regurgitation is not demonstrated. No evidence of tricuspid  stenosis.   Aortic Valve: The aortic valve is tricuspid. There is mild thickening of  the aortic valve. Aortic valve regurgitation is not visualized. Aortic  valve sclerosis/calcification is present, without any evidence of aortic  stenosis.   Pulmonic Valve: The pulmonic valve was not well visualized. Pulmonic valve  regurgitation is not visualized. No evidence of pulmonic stenosis.   Aorta: Aortic dilatation noted. There is borderline dilatation of the  aortic root, measuring 37 mm. There is mild dilatation of the ascending  aorta, measuring 43 mm.   Venous: The inferior vena cava is normal in size with greater than 50%  respiratory variability, suggesting right atrial pressure of 3 mmHg.   IAS/Shunts: No atrial level shunt detected by color flow Doppler.       Physical Exam:   VS:  BP 118/72 (BP Location: Right Arm, Patient Position: Sitting, Cuff Size: Large)   Pulse 79   Resp 12   Ht 5' 8 (1.727 m)   Wt 257 lb 12.8 oz (116.9 kg)   SpO2 95%   BMI 39.20 kg/m    Wt Readings from Last 3 Encounters:  09/04/23 257 lb 12.8 oz (116.9 kg)  05/21/23 253 lb 3.2 oz (114.9 kg)  02/18/23 252 lb (114.3 kg)    GEN: Well nourished, well developed in no acute distress NECK: No JVD; No carotid bruits CARDIAC: RRR, no murmurs, rubs, gallops RESPIRATORY:  Clear to  auscultation without rales, wheezing or rhonchi  ABDOMEN: Soft, non-tender, non-distended EXTREMITIES:  No edema; No deformity   ASSESSMENT AND PLAN: .    Fluid Overload/HFpEF Patient self-discontinued diuretic (Demodex) for 4 days resulting in significant fluid retention and lower extremity edema. Urgent care increased Demodex from 40mg  to 60mg  daily. -Continue Demodex 60mg  daily until 09/08/2023, then decrease to 40mg  daily. -Check BMP next week to assess renal function after diuretic adjustment.  Hypertriglyceridemia Last lipid panel from 07/2022 showed elevated triglycerides (481). -Order fasting lipid panel next week along with BMP.  Medication Refill Patient requires new prescription for Farxiga  for [place] [facility] [place] program. -Submit new prescription for Farxiga  10mg  daily.  Premature Ventricular Complexes (PVCs) Asymptomatic PVCs noted on EKG. No current symptoms reported by patient. -Monitor for symptoms of palpitations or fluttering. Consider ambulatory monitoring if symptoms develop.  Aortic Root Dilatation Last echocardiogram in 04/2023 showed aortic root of 37mm and ascending aorta of 43mm. -Plan for repeat echocardiogram in 04/2024.  Mild nonobstructive CAD -no symptoms of chest pain, some SOB in the setting of fluid overload -continue current  medications     Dispo: Follow-up labs next week. Follow-up in 3 months with Dr. Santo   Signed, Orren LOISE Fabry, PA-C

## 2023-09-08 ENCOUNTER — Ambulatory Visit: Payer: Medicare PPO | Admitting: Family Medicine

## 2023-09-08 VITALS — BP 146/82 | HR 79 | Wt 254.1 lb

## 2023-09-08 DIAGNOSIS — J069 Acute upper respiratory infection, unspecified: Secondary | ICD-10-CM

## 2023-09-08 DIAGNOSIS — L089 Local infection of the skin and subcutaneous tissue, unspecified: Secondary | ICD-10-CM

## 2023-09-08 DIAGNOSIS — R0989 Other specified symptoms and signs involving the circulatory and respiratory systems: Secondary | ICD-10-CM

## 2023-09-08 DIAGNOSIS — S80822A Blister (nonthermal), left lower leg, initial encounter: Secondary | ICD-10-CM | POA: Diagnosis not present

## 2023-09-08 MED ORDER — DOXYCYCLINE HYCLATE 100 MG PO TABS: 100.0000 mg | ORAL_TABLET | Freq: Two times a day (BID) | ORAL | 0 refills | Status: AC

## 2023-09-08 MED ORDER — MUPIROCIN 2 % EX OINT: 1.0000 | TOPICAL_OINTMENT | Freq: Two times a day (BID) | CUTANEOUS | 0 refills | Status: DC

## 2023-09-08 NOTE — Progress Notes (Signed)
 Established Patient Office Visit   Subjective  Patient ID: Julie Bean, female    DOB: 1951-02-06  Age: 73 y.o. MRN: [id number]  Chief Complaint  Patient presents with   Acute Visit    Large blister on left shin. Has discoloration, around 1/2+  inch in diameter  Cold symptoms, cough, ear pain and congestion, sore throat, runny nose x 2 weeks.     She  has a past medical history of Arthritis, Cataract, CHF (congestive heart failure) (HCC), Chronic kidney disease, Closed fracture of left proximal humerus (04/07/2018), Diabetes mellitus without complication (HCC), DISH (diffuse idiopathic skeletal hyperostosis), Encounter for well adult exam with abnormal findings (08/10/2022), GERD (gastroesophageal reflux disease), Headache, hemicrania continua, Hyperlipidemia, Hypertension, Hypothyroidism, Macular degeneration, Macular degeneration, PONV (postoperative nausea and vomiting), Proximal humerus fracture, and Thyroid  disease.  Patient complains of persistent  cough. Patient describes symptoms of bilateral ear pain, fatigue, headache, malaise, and sore throat. Symptoms began several days ago and are gradually improving since that time. Patient denies dyspnea, chest pain, or nausea and vomiting. Treatment thus far includes facial warm compress with mild improvement  Past pulmonary history is significant for no history of pneumonia or bronchitis     Review of Systems  Constitutional:  Negative for chills and fever.  HENT:  Positive for congestion, ear pain and sore throat.   Eyes:  Negative for blurred vision.  Respiratory:  Positive for cough and sputum production.   Cardiovascular:  Negative for chest pain.  Neurological:  Positive for headaches. Negative for dizziness.      Objective:     BP (!) 146/82   Pulse 79   Wt 254 lb 1.9 oz (115.3 kg)   SpO2 93%   BMI 38.64 kg/m  BP Readings from Last 3 Encounters:  09/08/23 (!) 146/82  09/04/23 118/72  09/01/23 138/78       Physical Exam Vitals reviewed.  Constitutional:      General: She is not in acute distress.    Appearance: Normal appearance. She is not ill-appearing, toxic-appearing or diaphoretic.  HENT:     Head: Normocephalic.     Mouth/Throat:     Pharynx: Posterior oropharyngeal erythema present.  Eyes:     General:        Right eye: No discharge.        Left eye: No discharge.     Conjunctiva/sclera: Conjunctivae normal.  Cardiovascular:     Rate and Rhythm: Normal rate.     Pulses: Normal pulses.     Heart sounds: Normal heart sounds.  Pulmonary:     Effort: Pulmonary effort is normal. No respiratory distress.     Breath sounds: Wheezing present.  Abdominal:     General: Bowel sounds are normal.     Palpations: Abdomen is soft.  Musculoskeletal:     Cervical back: Normal range of motion.     Right lower leg: Edema present.     Left lower leg: Edema present.  Skin:    General: Skin is warm and dry.     Capillary Refill: Capillary refill takes less than 2 seconds.  Neurological:     Mental Status: She is alert.  Psychiatric:        Mood and Affect: Mood normal.        Behavior: Behavior normal.      No results found for any visits on 09/08/23.  The 10-year ASCVD risk score (Arnett DK, et al., 2019) is: 32.2%    Assessment & Plan:  Skin infection -     Doxycycline  Hyclate; Take 1 tablet (100 mg total) by mouth 2 (two) times daily for 10 days.  Dispense: 20 tablet; Refill: 0 -     Mupirocin ; Apply 1 Application topically 2 (two) times daily. For 10 days  Dispense: 30 g; Refill: 0  Upper respiratory symptom -     Doxycycline  Hyclate; Take 1 tablet (100 mg total) by mouth 2 (two) times daily for 10 days.  Dispense: 20 tablet; Refill: 0  Upper respiratory tract infection, unspecified type Assessment & Plan: Doxycyline 100 mg twice daily x 7 days, Xyzal 5 mg at bedtime. Advise patient to rest to support your body's recovery. Stay hydrated by drinking water, tea, or broth.  Using a humidifier can help soothe throat irritation and ease nasal congestion. For fever or pain, acetaminophen  (Tylenol ) is recommended. To relieve other symptoms, try saline nasal sprays, throat lozenges, or gargling with saltwater. Focus on eating light, healthy meals like fruits and vegetables to keep your strength up. Practice good hygiene by washing your hands frequently and covering your mouth when coughing or sneezing.Follow-up for worsening or persistent symptoms. Patient verbalizes understanding regarding plan of care and all questions answered     Blister of left leg, initial encounter Assessment & Plan: Blister on the left shin with purulent yellow drainage and an erythematous surrounding area. Started Bactroban  ointment twice daily x 10 days Discussed to keep skin clean and dry. Place a cool, damp cloth on the affected area, Apply compression wraps or stockings to help reduce the swelling Elevate the affected part of the body,  Follow up for erythema, purulent drainage, fever. . Patient verbalizes understanding regarding plan of care and all questions answered      Return if symptoms worsen or fail to improve.   Hilario Kidd Wilhelmena Falter, FNP

## 2023-09-08 NOTE — Patient Instructions (Signed)

## 2023-09-08 NOTE — Assessment & Plan Note (Signed)
 Blister on the left shin with purulent yellow drainage and an erythematous surrounding area. Started Bactroban  ointment twice daily x 10 days Discussed to keep skin clean and dry. Place a cool, damp cloth on the affected area, Apply compression wraps or stockings to help reduce the swelling Elevate the affected part of the body,  Follow up for erythema, purulent drainage, fever. . Patient verbalizes understanding regarding plan of care and all questions answered

## 2023-09-08 NOTE — Assessment & Plan Note (Addendum)
 Doxycyline 100 mg twice daily x 7 days, Xyzal 5 mg at bedtime. Advise patient to rest to support your body's recovery. Stay hydrated by drinking water, tea, or broth. Using a humidifier can help soothe throat irritation and ease nasal congestion. For fever or pain, acetaminophen  (Tylenol ) is recommended. To relieve other symptoms, try saline nasal sprays, throat lozenges, or gargling with saltwater. Focus on eating light, healthy meals like fruits and vegetables to keep your strength up. Practice good hygiene by washing your hands frequently and covering your mouth when coughing or sneezing.Follow-up for worsening or persistent symptoms. Patient verbalizes understanding regarding plan of care and all questions answered

## 2023-09-18 DIAGNOSIS — I5033 Acute on chronic diastolic (congestive) heart failure: Secondary | ICD-10-CM | POA: Diagnosis not present

## 2023-09-18 DIAGNOSIS — R0609 Other forms of dyspnea: Secondary | ICD-10-CM | POA: Diagnosis not present

## 2023-09-18 DIAGNOSIS — I77810 Thoracic aortic ectasia: Secondary | ICD-10-CM | POA: Diagnosis not present

## 2023-09-18 DIAGNOSIS — I2510 Atherosclerotic heart disease of native coronary artery without angina pectoris: Secondary | ICD-10-CM | POA: Diagnosis not present

## 2023-09-19 LAB — COMPREHENSIVE METABOLIC PANEL
ALT: 17 [IU]/L (ref 0–32)
AST: 22 [IU]/L (ref 0–40)
Albumin: 3.7 g/dL — ABNORMAL LOW (ref 3.8–4.8)
Alkaline Phosphatase: 69 [IU]/L (ref 44–121)
BUN/Creatinine Ratio: 29 — ABNORMAL HIGH (ref 12–28)
BUN: 36 mg/dL — ABNORMAL HIGH (ref 8–27)
Bilirubin Total: 0.3 mg/dL (ref 0.0–1.2)
CO2: 24 mmol/L (ref 20–29)
Calcium: 8.7 mg/dL (ref 8.7–10.3)
Chloride: 96 mmol/L (ref 96–106)
Creatinine, Ser: 1.25 mg/dL — ABNORMAL HIGH (ref 0.57–1.00)
Globulin, Total: 2.5 g/dL (ref 1.5–4.5)
Glucose: 125 mg/dL — ABNORMAL HIGH (ref 70–99)
Potassium: 4.8 mmol/L (ref 3.5–5.2)
Sodium: 137 mmol/L (ref 134–144)
Total Protein: 6.2 g/dL (ref 6.0–8.5)
eGFR: 46 mL/min/{1.73_m2} — ABNORMAL LOW (ref >59)

## 2023-09-19 LAB — LIPID PANEL
Chol/HDL Ratio: 2.8 {ratio} (ref 0.0–4.4)
Cholesterol, Total: 128 mg/dL (ref 100–199)
HDL: 45 mg/dL (ref >39)
LDL Chol Calc (NIH): 47 mg/dL (ref 0–99)
Triglycerides: 231 mg/dL — ABNORMAL HIGH (ref 0–149)
VLDL Cholesterol Cal: 36 mg/dL (ref 5–40)

## 2023-09-22 ENCOUNTER — Encounter: Payer: Self-pay | Admitting: Physician Assistant

## 2023-09-23 ENCOUNTER — Telehealth: Payer: Self-pay | Admitting: Physician Assistant

## 2023-09-23 ENCOUNTER — Telehealth: Payer: Self-pay

## 2023-09-23 MED ORDER — ICOSAPENT ETHYL 1 G PO CAPS: 2.0000 g | ORAL_CAPSULE | Freq: Two times a day (BID) | ORAL | 3 refills | Status: DC

## 2023-09-23 NOTE — Telephone Encounter (Signed)
  Pt c/o medication issue:  1. Name of Medication:   icosapent Ethyl (VASCEPA) 1 g capsule    2. How are you currently taking this medication (dosage and times per day)?   Take 2 capsules (2 g total) by mouth 2 (two) times daily.    3. Are you having a reaction (difficulty breathing--STAT)? No   4. What is your medication issue? Pt said, [place] pharmacy told her that this medication will cost her $100 for 1 month. She said, she can't afford that and if [facility] Hy can prescribed alternative medication for her

## 2023-09-23 NOTE — Telephone Encounter (Signed)
The patient has been notified of the result and verbalized understanding.  All questions (if any) were answered. Frutoso Schatz, RN 09/23/2023 1:41 PM    Patient states that she is willing to try vascepa. Prescription has been sent in.

## 2023-09-23 NOTE — Telephone Encounter (Signed)
-----   Message from Sharlene Dory sent at 09/22/2023  9:56 AM EST ----- Ms. Gardon,   It appears that your triglycerides are still elevated.  They have been elevated for a few years now.  Have you ever tried a medication called Vascepa which is a strong fish oil that is a prescription or fenofibrate?  I think you would benefit from 1 or potentially both of these medications.  Please let me know if you are willing to try 1 of these options.  Kidneys are at baseline.   Thanks! Sharlene Dory, PA-C

## 2023-09-24 ENCOUNTER — Ambulatory Visit: Payer: Self-pay | Admitting: Internal Medicine

## 2023-09-24 NOTE — Telephone Encounter (Signed)
  Chief Complaint: left shin wound/abscess not improved after antibiotics Symptoms: boil to left shin (red, inflamed, appears to have pus) Frequency: ongoing since 08/28/23 Pertinent Negatives: Patient denies fever, red streaks, shaking, chills, rash, weakness, necrosis Disposition: [] ED /[] Urgent Care (no appt availability in office) / [x] Appointment(In office/virtual)/ []  [facility]/ [] Home Care/ [] Refused Recommended Disposition /[] [facility]/ []  Follow-up with PCP Additional Notes: Patient states she finished the oral antibiotic on Friday and today is the last day for topical cream. Patient agreeable to office visit tomorrow morning. Reason for Disposition  Boil > 2 inches across (> 5 cm; larger than a golf ball or ping pong ball)  Answer Assessment - Initial Assessment Questions 1. APPEARANCE of BOIL: "What does the boil look like?"      Red, inflamed with pus under the skin. "Feels squishy to touch"  2. LOCATION: "Where is the boil located?"      Left shin.  3. NUMBER: "How many boils are there?"      1.  4. SIZE: "How big is the boil?" (e.g., inches, cm; compare to size of a coin or other object)     3 inches wide by 1 inch long.  5. ONSET: "When did the boil start?"     Started smaller around December 27th when she scratched her leg and did not clean it properly, grew over the next few days. She states she was seen at urgent care on Dec 31st. She was told to get back on her diuretics, which did not improve. She states she was then seen in office Jan 7th.  6. PAIN: "Is there any pain?" If Yes, ask: "How bad is the pain?"   (Scale 1-10; or mild, moderate, severe)     3/10.  7. FEVER: "Do you have a fever?" If Yes, ask: "What is it, how was it measured, and when did it start?"      Denies.  8. SOURCE: "Have you been around anyone with boils or other Staph infections?" "Have you ever had boils before?"     Denies history of boils or known exposure to  Staph infection. She states the infection started when she was at an out of town wedding and is unsure if exposed to anything.  9. OTHER SYMPTOMS: "Do you have any other symptoms?" (e.g., shaking chills, weakness, rash elsewhere on body)     Denies.  Protocols used: Boil (Skin Abscess)-A-AH

## 2023-09-25 ENCOUNTER — Ambulatory Visit (INDEPENDENT_AMBULATORY_CARE_PROVIDER_SITE_OTHER): Payer: Medicare PPO | Admitting: Family Medicine

## 2023-09-25 ENCOUNTER — Encounter: Payer: Self-pay | Admitting: Family Medicine

## 2023-09-25 VITALS — BP 134/79 | HR 86 | Resp 16 | Ht 68.0 in | Wt 255.4 lb

## 2023-09-25 DIAGNOSIS — M7989 Other specified soft tissue disorders: Secondary | ICD-10-CM | POA: Diagnosis not present

## 2023-09-25 NOTE — Assessment & Plan Note (Addendum)
I recommend daily elevation of the legs for 20-30 minutes above the level of the heart. Additionally, the daily use of compression stockings is advised, along with regular exercise and weight reduction. It is also important to avoid prolonged sitting or standing to help alleviate symptoms.  The symptoms are likely consistent with stasis dermatitis, with a low suspicion of cellulitis. A referral has been placed to vascular surgery to assess the patency of the lower extremity veins and rule out venous insufficiency.  Skin Care: Encouraged to keep the skin moisturized to prevent cracking and dryness. Use fragrance-free, hypoallergenic moisturizers (e.g., Eucerin, CeraVe).
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
## 2023-09-25 NOTE — Patient Instructions (Addendum)
I appreciate the opportunity to provide care to you today!    Follow up:  pcp  Stasis Dermatitis  Stasis dermatitis, also known as venous stasis dermatitis, is a chronic inflammatory skin condition caused by poor blood circulation in the lower legs. It often develops in individuals with chronic venous insufficiency (CVI), a condition where blood pools in the veins due to weakened valves. This pooling increases pressure in the veins, leading to fluid leakage into surrounding tissues and resulting in visible skin changes.  Skin Changes Discoloration: Red, brown, or purple patches, especially around the ankles. Skin Texture: Thinning or shiny skin in affected areas.  Management and Treatment  Leg Elevation: Elevate the legs above heart level several times a day to improve blood flow and reduce swelling. Compression Therapy: Wear compression stockings to promote venous return and reduce edema. Ensure proper fitting to avoid further irritation of the skin. Skin Care: Keep the skin moisturized to prevent cracking and dryness. Use fragrance-free, hypoallergenic moisturizers (e.g., Eucerin, CeraVe). Weight Management: -Maintain a healthy weight to reduce strain on the leg veins and improve circulation.   Please stop by your local pharmacy and get your Tdap and Shingles vaccine  Referrals today- Vascular surgery  Attached with your AVS, you will find valuable resources for self-education. I highly recommend dedicating some time to thoroughly examine them.   Please continue to a heart-healthy diet and increase your physical activities. Try to exercise for at least five days a week.   It was a pleasure to see you and I look forward to continuing to work together on your health and well-being. Please do not hesitate to call the office if you need care or have questions about your care.  In case of emergency, please visit the Emergency Department for urgent care, or contact our clinic  at [phone] to schedule an appointment. We're here to help you!   Have a wonderful day and week. With Gratitude, Gilmore Laroche MSN, FNP-BC

## 2023-09-28 MED ORDER — FENOFIBRATE 145 MG PO TABS: 145.0000 mg | ORAL_TABLET | Freq: Every day | ORAL | 3 refills | Status: DC

## 2023-09-28 NOTE — Telephone Encounter (Signed)
Prescription has been sent in

## 2023-10-06 ENCOUNTER — Other Ambulatory Visit: Payer: Self-pay

## 2023-10-06 DIAGNOSIS — E782 Mixed hyperlipidemia: Secondary | ICD-10-CM

## 2023-10-15 ENCOUNTER — Ambulatory Visit (INDEPENDENT_AMBULATORY_CARE_PROVIDER_SITE_OTHER): Payer: Medicare PPO | Admitting: Internal Medicine

## 2023-10-15 ENCOUNTER — Encounter: Payer: Self-pay | Admitting: Internal Medicine

## 2023-10-15 VITALS — BP 176/72 | HR 75 | Ht 68.0 in | Wt 261.0 lb

## 2023-10-15 DIAGNOSIS — Z9181 History of falling: Secondary | ICD-10-CM | POA: Diagnosis not present

## 2023-10-15 DIAGNOSIS — R2689 Other abnormalities of gait and mobility: Secondary | ICD-10-CM | POA: Diagnosis not present

## 2023-10-15 NOTE — Assessment & Plan Note (Signed)
Presenting today for an acute visit in the setting of a recent fall as described above.  She fell forward while getting undressed, hitting her nose and the top of her head.  Questionable LOC.  Neurologic exam is grossly intact.  Strength and sensation are grossly intact bilaterally.  Her fall is attributed to becoming off balance while leaning forward.  Overall she is concerned about unsteady gait and is worried about the potential for falls.  Based on reassuring exam today, no imaging is indicated in the setting of recent fall.  Continue current supportive care measures.  I agree with Ms. Gervase that she would benefit from physical therapy evaluation in the setting of impaired gait and mobility.  Physical therapy referral placed today.  She will return to care for previously scheduled follow-up next month (3/19).

## 2023-10-15 NOTE — Progress Notes (Signed)
Acute Office Visit  Subjective:     Patient ID: Julie Bean, female    DOB: 05-07-1951, 73 y.o.   MRN: [id number]  Chief Complaint  Patient presents with   [name] on 10/10/2023. Pain in face, head, and along the arm   [name] presents today for an acute visit in the setting of a recent fall.  She reports a fall on Saturday night (2/8) that occurred while she was getting undressed in her room.  She describes leaning too far forward, becoming unsteady, and falling into the side of her bed.  She hit her nose and the top of her head.  Questionable loss of consciousness.  She recalls laying on the floor with a significant amount of blood beside her.  She feels sore and is chiefly concerned about her balance.  Denies persistent dizziness, lightheadedness, headaches, difficulty concentrating, tinnitus, and numbness/weakness in her extremities.  Her iPhone has alerted her to unsteady gait recently and says that her chances of falling over the next 12 months are high.  Review of Systems  Neurological:  Positive for weakness.  All other systems reviewed and are negative.     Objective:    BP (!) 176/72 (BP Location: Left Arm, Patient Position: Sitting, Cuff Size: Large)   Pulse 75   Ht 5\' 8"  (1.727 m)   Wt 261 lb (118.4 kg)   SpO2 96%   BMI 39.68 kg/m   Physical Exam Vitals reviewed.  Constitutional:      General: She is not in acute distress.    Appearance: Normal appearance. She is obese. She is not toxic-appearing.  HENT:     Head: Normocephalic.     Comments: Ecchymoses noted below each eye and across the bridge of the nose    Right Ear: External ear normal.     Left Ear: External ear normal.     Nose: Nose normal. No congestion or rhinorrhea.     Mouth/Throat:     Mouth: Mucous membranes are moist.     Pharynx: Oropharynx is clear. No oropharyngeal exudate or posterior oropharyngeal erythema.  Eyes:     General: No scleral icterus.    Extraocular Movements:  Extraocular movements intact.     Conjunctiva/sclera: Conjunctivae normal.     Pupils: Pupils are equal, round, and reactive to light.  Cardiovascular:     Rate and Rhythm: Normal rate and regular rhythm.     Pulses: Normal pulses.     Heart sounds: Normal heart sounds. No murmur heard.    No friction rub. No gallop.  Pulmonary:     Effort: Pulmonary effort is normal.     Breath sounds: Normal breath sounds. No wheezing, rhonchi or rales.  Abdominal:     General: Abdomen is flat. Bowel sounds are normal. There is no distension.     Palpations: Abdomen is soft.     Tenderness: There is no abdominal tenderness.  Musculoskeletal:        General: No swelling. Normal range of motion.     Cervical back: Normal range of motion.     Right lower leg: No edema.     Left lower leg: No edema.  Lymphadenopathy:     Cervical: No cervical adenopathy.  Skin:    General: Skin is warm and dry.     Capillary Refill: Capillary refill takes less than 2 seconds.     Coloration: Skin is not jaundiced.  Neurological:     General: No  focal deficit present.     Mental Status: She is alert and oriented to person, place, and time.  Psychiatric:        Mood and Affect: Mood normal.        Behavior: Behavior normal.       Assessment & Plan:   Problem List Items Addressed This Visit       History of recent fall   Presenting today for an acute visit in the setting of a recent fall as described above.  She fell forward while getting undressed, hitting her nose and the top of her head.  Questionable LOC.  Neurologic exam is grossly intact.  Strength and sensation are grossly intact bilaterally.  Her fall is attributed to becoming off balance while leaning forward.  Overall she is concerned about unsteady gait and is worried about the potential for falls.  Based on reassuring exam today, no imaging is indicated in the setting of recent fall.  Continue current supportive care measures.  I agree with Ms.  Brierley that she would benefit from physical therapy evaluation in the setting of impaired gait and mobility.  Physical therapy referral placed today.  She will return to care for previously scheduled follow-up next month (3/19).      Return if symptoms worsen or fail to improve.  Billie Lade, MD

## 2023-10-15 NOTE — Patient Instructions (Signed)
It was a pleasure to see you today.  Thank you for giving [place] the opportunity to be involved in your care.  Below is a brief recap of your visit and next steps.  We will plan to see you again on 3/19.  Summary Physical therapy referral placed today Follow up as scheduled on 3/19

## 2023-11-04 ENCOUNTER — Other Ambulatory Visit: Payer: Self-pay

## 2023-11-04 ENCOUNTER — Ambulatory Visit (HOSPITAL_COMMUNITY): Payer: Medicare PPO | Attending: Internal Medicine

## 2023-11-04 DIAGNOSIS — R2689 Other abnormalities of gait and mobility: Secondary | ICD-10-CM | POA: Insufficient documentation

## 2023-11-04 DIAGNOSIS — R296 Repeated falls: Secondary | ICD-10-CM | POA: Diagnosis not present

## 2023-11-04 DIAGNOSIS — R29898 Other symptoms and signs involving the musculoskeletal system: Secondary | ICD-10-CM | POA: Diagnosis not present

## 2023-11-04 DIAGNOSIS — R262 Difficulty in walking, not elsewhere classified: Secondary | ICD-10-CM | POA: Insufficient documentation
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
## 2023-11-11 ENCOUNTER — Other Ambulatory Visit: Payer: Self-pay

## 2023-11-11 ENCOUNTER — Ambulatory Visit (HOSPITAL_COMMUNITY): Admitting: Physical Therapy

## 2023-11-11 DIAGNOSIS — R296 Repeated falls: Secondary | ICD-10-CM

## 2023-11-11 DIAGNOSIS — R2689 Other abnormalities of gait and mobility: Secondary | ICD-10-CM

## 2023-11-11 DIAGNOSIS — M7989 Other specified soft tissue disorders: Secondary | ICD-10-CM

## 2023-11-11 DIAGNOSIS — R262 Difficulty in walking, not elsewhere classified: Secondary | ICD-10-CM | POA: Diagnosis not present

## 2023-11-11 DIAGNOSIS — R29898 Other symptoms and signs involving the musculoskeletal system: Secondary | ICD-10-CM
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
## 2023-11-16 ENCOUNTER — Other Ambulatory Visit: Payer: Self-pay | Admitting: Internal Medicine

## 2023-11-16 DIAGNOSIS — E039 Hypothyroidism, unspecified: Secondary | ICD-10-CM

## 2023-11-17 ENCOUNTER — Ambulatory Visit (HOSPITAL_COMMUNITY)

## 2023-11-17 ENCOUNTER — Encounter (HOSPITAL_COMMUNITY): Payer: Self-pay

## 2023-11-17 DIAGNOSIS — R296 Repeated falls: Secondary | ICD-10-CM | POA: Diagnosis not present

## 2023-11-17 DIAGNOSIS — R262 Difficulty in walking, not elsewhere classified: Secondary | ICD-10-CM

## 2023-11-17 DIAGNOSIS — R2689 Other abnormalities of gait and mobility: Secondary | ICD-10-CM | POA: Diagnosis not present

## 2023-11-17 DIAGNOSIS — R29898 Other symptoms and signs involving the musculoskeletal system: Secondary | ICD-10-CM
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
## 2023-11-18 ENCOUNTER — Encounter: Payer: Self-pay | Admitting: Internal Medicine

## 2023-11-18 ENCOUNTER — Ambulatory Visit: Payer: Medicare PPO | Admitting: Internal Medicine

## 2023-11-18 VITALS — BP 119/70 | HR 87 | Ht 68.0 in | Wt 258.8 lb

## 2023-11-18 DIAGNOSIS — E039 Hypothyroidism, unspecified: Secondary | ICD-10-CM | POA: Diagnosis not present

## 2023-11-18 DIAGNOSIS — D510 Vitamin B12 deficiency anemia due to intrinsic factor deficiency: Secondary | ICD-10-CM | POA: Diagnosis not present

## 2023-11-18 DIAGNOSIS — N1831 Chronic kidney disease, stage 3a: Secondary | ICD-10-CM | POA: Diagnosis not present

## 2023-11-18 DIAGNOSIS — E1165 Type 2 diabetes mellitus with hyperglycemia: Secondary | ICD-10-CM

## 2023-11-18 DIAGNOSIS — E1159 Type 2 diabetes mellitus with other circulatory complications: Secondary | ICD-10-CM

## 2023-11-18 DIAGNOSIS — Z7984 Long term (current) use of oral hypoglycemic drugs: Secondary | ICD-10-CM

## 2023-11-18 DIAGNOSIS — I152 Hypertension secondary to endocrine disorders: Secondary | ICD-10-CM | POA: Diagnosis not present

## 2023-11-18 NOTE — Patient Instructions (Signed)
 It was a pleasure to see you today.  Thank you for giving [place] the opportunity to be involved in your care.  Below is a brief recap of your visit and next steps.  We will plan to see you again in 6 months.  Summary No medication changes today Repeat labs ordered We will tentatively plan for follow up in 6 months
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
## 2023-11-19 ENCOUNTER — Ambulatory Visit (HOSPITAL_COMMUNITY)
Admission: RE | Admit: 2023-11-19 | Discharge: 2023-11-19 | Disposition: A | Discharge status: Home / Self Care | Payer: Medicare PPO | Source: Ambulatory Visit | Attending: Vascular Surgery | Admitting: Vascular Surgery

## 2023-11-19 ENCOUNTER — Encounter: Payer: Self-pay | Admitting: Internal Medicine

## 2023-11-19 ENCOUNTER — Encounter: Payer: Self-pay | Admitting: Vascular Surgery

## 2023-11-19 ENCOUNTER — Ambulatory Visit (INDEPENDENT_AMBULATORY_CARE_PROVIDER_SITE_OTHER): Payer: Medicare PPO | Admitting: Vascular Surgery

## 2023-11-19 VITALS — BP 119/71 | HR 78 | Temp 98.0°F | Resp 20 | Ht 68.0 in | Wt 261.3 lb

## 2023-11-19 DIAGNOSIS — M7989 Other specified soft tissue disorders: Secondary | ICD-10-CM

## 2023-11-19 DIAGNOSIS — I872 Venous insufficiency (chronic) (peripheral): Secondary | ICD-10-CM
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
## 2023-11-19 NOTE — Assessment & Plan Note (Signed)
 Remains adequately controlled on current antihypertensive regimen.  No medication changes are indicated today.

## 2023-11-19 NOTE — Assessment & Plan Note (Signed)
 A1c 8.0 on labs from September 2024.  She is currently prescribed glipizide 10 mg daily, Farxiga 10 mg daily, and metformin 1000 mg twice daily.  Repeat A1c and urine microalbumin/creatinine ratio ordered today

## 2023-11-19 NOTE — Assessment & Plan Note (Signed)
 Renal function stable on recent labs.  Followed by nephrology and will be seen this summer.  Currently prescribed Farxiga.

## 2023-11-19 NOTE — Assessment & Plan Note (Signed)
 TSH WNL on labs from September 2024.  She is currently prescribed levothyroxine 175 mcg daily except on Mondays and Fridays where she takes half a tablet (87.5 mcg).  She remains asymptomatic.  Repeat thyroid studies ordered today.

## 2023-11-20 LAB — TSH+FREE T4
Free T4: 1.25 ng/dL (ref 0.82–1.77)
TSH: 8.63 u[IU]/mL — ABNORMAL HIGH (ref 0.450–4.500)

## 2023-11-20 LAB — B12 AND FOLATE PANEL
Folate: >20 ng/mL (ref >3.0)
Vitamin B-12: 1038 pg/mL (ref 232–1245)

## 2023-11-20 LAB — HEMOGLOBIN A1C
Est. average glucose Bld gHb Est-mCnc: 160 mg/dL
Hgb A1c MFr Bld: 7.2 % — ABNORMAL HIGH (ref 4.8–5.6)

## 2023-11-20 LAB — MICROALBUMIN / CREATININE URINE RATIO: Creatinine, Urine: 78.4 mg/dL

## 2023-11-20 LAB — VITAMIN D 25 HYDROXY (VIT D DEFICIENCY, FRACTURES): Vit D, 25-Hydroxy: 34.9 ng/mL (ref 30.0–100.0)

## 2023-11-25 ENCOUNTER — Encounter (HOSPITAL_COMMUNITY)

## 2023-11-25 ENCOUNTER — Ambulatory Visit: Payer: Medicare PPO | Attending: Cardiology | Admitting: Pharmacist

## 2023-11-25 DIAGNOSIS — E1165 Type 2 diabetes mellitus with hyperglycemia: Secondary | ICD-10-CM | POA: Diagnosis not present

## 2023-11-25 DIAGNOSIS — E782 Mixed hyperlipidemia: Secondary | ICD-10-CM

## 2023-11-25 MED ORDER — DAPAGLIFLOZIN PROPANEDIOL 10 MG PO TABS: 10.0000 mg | ORAL_TABLET | Freq: Every day | ORAL | 3 refills | Status: DC

## 2023-11-25 NOTE — Patient Instructions (Addendum)
 After you go for labs, please stop taking rosuvastatin for 1-2 weeks to see if pain resolves. Please then re-challenge with the same dose.  Please call or send mychart message with how you feel

## 2023-11-25 NOTE — Progress Notes (Signed)
 Patient ID: Julie Bean                 DOB: [birth year]                    MRN: [id number]      HPI: Julie Bean is a 73 y.o. female patient referred to lipid clinic by Jari Favre. PMH is significant for hypertriglyceridemia, PVCs, aortic root dilation, heart failure with preserved ejection fraction, mild nonobstructive CAD.  In January her triglycerides were 231.  Previously close to 500.  LDL-C calculation 47.  Patient was started on Vascepa but co-pay was $100.  She was started on fenofibrate however she developed muscle pain and stopped taking.  She was referred to lipid clinic.  Patient presents today to lipid clinic.  She reports pain when she took the fenofibrate that resolved upon stopping.  However she states that recently this pain has come back.  Reports pain in her neck and upper back along with mainly her 1 arm.  We discussed that typically statin pain is mainly in the legs, but can occur in the arms but generally both arms.  She does have DISH which can affect her posture.  She is currently in physical therapy to help work on balance and leg strength as she has had a few falls.  Started about 2 weeks ago.  Has been working to improve her diet. A1c has improved from 8>7.2.  Current Medications: Rosuvastatin 10 mg daily Intolerances: Fenofibrate (muscle pain) Risk Factors: Nonobstructive CAD, age, diabetes, CKD LDL-C goal: Less than 70 ApoB goal: Less than 80 TG: <150  Diet:  Grilled chicken, canned tuna Drinks: unsweet ice tea, water Loves vegetables  Exercise: physical therapy for leg strength and balance  Family History:  Family History  Problem Relation Age of Onset   Cancer Mother        abdominal cancer of unknown primary site   Hearing loss Mother    Dementia Father    Cancer Sister        breast    Asthma Sister    Cancer Sister        breast   Cancer Sister        breast   Hearing loss Sister    Colon cancer Neg Hx     Social History: to  tobacco, no ETOH  Labs: Lipid Panel     Component Value Date/Time   CHOL 128 09/18/2023 0834   TRIG 231 (H) 09/18/2023 0834   HDL 45 09/18/2023 0834   CHOLHDL 2.8 09/18/2023 0834   LDLCALC 47 09/18/2023 0834   LABVLDL 36 09/18/2023 0834    Past Medical History:  Diagnosis Date   Arthritis    knees, shoulder, neck and hip   Cataract    Phreesia 09/18/2020   CHF (congestive heart failure) (HCC)    Chronic kidney disease    Closed fracture of left proximal humerus 04/07/2018   Diabetes mellitus without complication (HCC)    Type II   DISH (diffuse idiopathic skeletal hyperostosis)    Encounter for well adult exam with abnormal findings 08/10/2022   GERD (gastroesophageal reflux disease)    Headache, hemicrania continua    Hyperlipidemia    Phreesia 09/18/2020   Hypertension    Hypothyroidism    Macular degeneration    Macular degeneration    followed by Dr. Sherryll Burger in [place]   PONV (postoperative nausea and vomiting)    Proximal humerus fracture    Left  Thyroid disease    Phreesia 09/18/2020    Current Outpatient Medications on File Prior to Visit  Medication Sig Dispense Refill   acetaminophen (TYLENOL) 500 MG tablet Take 1,000 mg by mouth every 8 (eight) hours as needed for moderate pain.     diclofenac Sodium (VOLTAREN) 1 % GEL      divalproex (DEPAKOTE) 500 MG DR tablet Take 500 mg by mouth 3 (three) times daily.     gabapentin (NEURONTIN) 400 MG capsule Take 400 mg by mouth 2 (two) times daily.     glipiZIDE (GLUCOTROL XL) 10 MG 24 hr tablet TAKE ONE TABLET (10 MG TOTAL) BY MOUTH DAILY WITH BREAKFAST. 90 tablet 3   glucose blood (CONTOUR TEST) test strip Use as instructed once daily testing dx e11.9 100 each 5   indomethacin (INDOCIN) 50 MG capsule Take 1 capsule (50 mg total) by mouth 2 (two) times daily with a meal. 180 capsule 3   levothyroxine (SYNTHROID) 175 MCG tablet TAKE ONE TABLET ( ) BY MOUTH DAILY EXCEPT ON MONDAYS AND FRIDAYS TAKE ONE-HALF TABLET  (88.5MCG) 90 tablet 1   metFORMIN (GLUCOPHAGE) 500 MG tablet Take 2 tablets (1,000 mg total) by mouth 2 (two) times daily with a meal. 360 tablet 1   metoprolol succinate (TOPROL-XL) 25 MG 24 hr tablet Take 1 tablet (25 mg total) by mouth daily. 90 tablet 3   Multiple Vitamins-Minerals (PRESERVISION AREDS 2+MULTI VIT PO) Take 1 tablet by mouth 2 (two) times daily.     mupirocin ointment (BACTROBAN) 2 % Apply 1 Application topically 2 (two) times daily. For 10 days 30 g 0   omeprazole (PRILOSEC) 40 MG capsule Take 1 capsule (40 mg total) by mouth daily. 90 capsule 3   rosuvastatin (CRESTOR) 10 MG tablet Take 1 tablet (10 mg total) by mouth daily. 90 tablet 3   torsemide (DEMADEX) 20 MG tablet Take 2 tablets (40 mg total) by mouth daily. 180 tablet 3   No current facility-administered medications on file prior to visit.    No Known Allergies  Assessment/Plan:  1. Hyperlipidemia -  Mixed hyperlipidemia Assessment: On latest labs in January 2025 LDL-C was at goal.  However due to elevated triglycerides would be very interested to see what her ApoB is She reports some recent muscle pain which could or could not be from statin.  We discussed the easiest way to determine this is to hold statin for 1 to 2 weeks and see if pain improves.  If pain comes back on rechallenge then likely from medication Since her A1c has improved, I wonder if her triglycerides have also improved without medication. Recommended she try a desk cycle for exercise since there is concerns about her stability walking.  Continue working to build strength in legs with physical therapy  Plan: Repeat lipid panel along with ApoB Once labs are collected, hold statin for 1 to 2 weeks to see if improvement in pain.  Advised patient we will also need to rechallenge Patient to call me with how she does on statin holiday Depending on lab work we will determine if we pursue Vascepa with [facility]    Thank you,  Olene Floss, Pharm.D, BCACP, CPP [facility] [address] [place], [place] [address]  Phone: [phone]; Fax: [phone]

## 2023-11-25 NOTE — Assessment & Plan Note (Addendum)
 Assessment: On latest labs in January 2025 LDL-C was at goal.  However due to elevated triglycerides would be very interested to see what her ApoB is She reports some recent muscle pain which could or could not be from statin.  We discussed the easiest way to determine this is to hold statin for 1 to 2 weeks and see if pain improves.  If pain comes back on rechallenge then likely from medication Since her A1c has improved, I wonder if her triglycerides have also improved without medication. Recommended she try a desk cycle for exercise since there is concerns about her stability walking.  Continue working to build strength in legs with physical therapy  Plan: Repeat lipid panel along with ApoB Once labs are collected, hold statin for 1 to 2 weeks to see if improvement in pain.  Advised patient we will also need to rechallenge Patient to call me with how she does on statin holiday Depending on lab work we will determine if we pursue Vascepa with [facility]

## 2023-11-26 ENCOUNTER — Encounter (HOSPITAL_COMMUNITY): Payer: Self-pay

## 2023-11-26 ENCOUNTER — Ambulatory Visit (HOSPITAL_COMMUNITY)

## 2023-11-26 DIAGNOSIS — R296 Repeated falls: Secondary | ICD-10-CM | POA: Diagnosis not present

## 2023-11-26 DIAGNOSIS — R262 Difficulty in walking, not elsewhere classified: Secondary | ICD-10-CM

## 2023-11-26 DIAGNOSIS — R29898 Other symptoms and signs involving the musculoskeletal system: Secondary | ICD-10-CM | POA: Diagnosis not present

## 2023-11-26 DIAGNOSIS — E1165 Type 2 diabetes mellitus with hyperglycemia: Secondary | ICD-10-CM | POA: Diagnosis not present

## 2023-11-26 DIAGNOSIS — E782 Mixed hyperlipidemia: Secondary | ICD-10-CM | POA: Diagnosis not present

## 2023-11-26 DIAGNOSIS — R2689 Other abnormalities of gait and mobility: Secondary | ICD-10-CM | POA: Diagnosis not present
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
## 2023-11-27 ENCOUNTER — Telehealth: Payer: Self-pay

## 2023-11-27 ENCOUNTER — Encounter (HOSPITAL_COMMUNITY): Payer: Self-pay

## 2023-11-27 ENCOUNTER — Encounter: Payer: Self-pay | Admitting: Pharmacist

## 2023-11-27 ENCOUNTER — Ambulatory Visit (HOSPITAL_COMMUNITY)

## 2023-11-27 ENCOUNTER — Other Ambulatory Visit: Payer: Self-pay | Admitting: Pharmacist

## 2023-11-27 ENCOUNTER — Other Ambulatory Visit: Payer: Self-pay

## 2023-11-27 ENCOUNTER — Other Ambulatory Visit (HOSPITAL_COMMUNITY): Payer: Self-pay

## 2023-11-27 DIAGNOSIS — R29898 Other symptoms and signs involving the musculoskeletal system: Secondary | ICD-10-CM

## 2023-11-27 DIAGNOSIS — R262 Difficulty in walking, not elsewhere classified: Secondary | ICD-10-CM

## 2023-11-27 DIAGNOSIS — R296 Repeated falls: Secondary | ICD-10-CM | POA: Diagnosis not present

## 2023-11-27 DIAGNOSIS — R2689 Other abnormalities of gait and mobility: Secondary | ICD-10-CM

## 2023-11-27 LAB — LIPID PANEL
Chol/HDL Ratio: 3.7 ratio (ref 0.0–4.4)
Cholesterol, Total: 189 mg/dL (ref 100–199)
HDL: 51 mg/dL (ref >39)
LDL Chol Calc (NIH): 80 mg/dL (ref 0–99)
Triglycerides: 363 mg/dL — ABNORMAL HIGH (ref 0–149)
VLDL Cholesterol Cal: 58 mg/dL — ABNORMAL HIGH (ref 5–40)

## 2023-11-27 LAB — APOLIPOPROTEIN B: Apolipoprotein B: 115 mg/dL — ABNORMAL HIGH (ref <90)

## 2023-11-27 MED ORDER — ICOSAPENT ETHYL 1 G PO CAPS
2.0000 g | ORAL_CAPSULE | Freq: Two times a day (BID) | ORAL | 3 refills | Status: AC
  Filled 2023-11-27: qty 360, 90d supply, fill #0
  Filled 2024-02-21: qty 360, 90d supply, fill #1
  Filled 2024-05-23: qty 360, 90d supply, fill #2
  Filled 2024-08-18: qty 360, 90d supply, fill #3

## 2023-11-27 NOTE — Telephone Encounter (Addendum)
 Patient Advocate Encounter   The patient was approved for a [employer] grant that will help cover the cost of VASCEPA Total amount awarded, $2,500.  Effective: 10/28/23 - 10/26/24   [id number] ONG:[id number] [id number]   Pharmacy provided with approval and processing information. Pt is aware of grant.   Haze Rushing, CPhT  Pharmacy Patient Advocate Specialist  Direct Number: [phone] Fax: [phone]
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
## 2023-12-01 ENCOUNTER — Ambulatory Visit (HOSPITAL_COMMUNITY): Attending: Internal Medicine | Admitting: Physical Therapy

## 2023-12-01 DIAGNOSIS — R262 Difficulty in walking, not elsewhere classified: Secondary | ICD-10-CM | POA: Insufficient documentation

## 2023-12-01 DIAGNOSIS — R2689 Other abnormalities of gait and mobility: Secondary | ICD-10-CM | POA: Insufficient documentation

## 2023-12-01 DIAGNOSIS — R29898 Other symptoms and signs involving the musculoskeletal system: Secondary | ICD-10-CM | POA: Insufficient documentation

## 2023-12-01 DIAGNOSIS — R296 Repeated falls: Secondary | ICD-10-CM | POA: Insufficient documentation
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
## 2023-12-03 ENCOUNTER — Ambulatory Visit (HOSPITAL_COMMUNITY)

## 2023-12-03 ENCOUNTER — Encounter (HOSPITAL_COMMUNITY): Payer: Self-pay

## 2023-12-03 DIAGNOSIS — R262 Difficulty in walking, not elsewhere classified: Secondary | ICD-10-CM | POA: Diagnosis not present

## 2023-12-03 DIAGNOSIS — R296 Repeated falls: Secondary | ICD-10-CM | POA: Diagnosis not present

## 2023-12-03 DIAGNOSIS — R29898 Other symptoms and signs involving the musculoskeletal system: Secondary | ICD-10-CM

## 2023-12-03 DIAGNOSIS — R2689 Other abnormalities of gait and mobility: Secondary | ICD-10-CM
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
## 2023-12-04 ENCOUNTER — Encounter: Payer: Self-pay | Admitting: Pharmacist

## 2023-12-04 NOTE — Addendum Note (Signed)
 Addended by: Starling Manns D on: 12/04/2023 09:05 AM   Modules accepted: Orders

## 2023-12-08 ENCOUNTER — Ambulatory Visit (HOSPITAL_COMMUNITY): Admitting: Physical Therapy

## 2023-12-08 DIAGNOSIS — R29898 Other symptoms and signs involving the musculoskeletal system: Secondary | ICD-10-CM | POA: Diagnosis not present

## 2023-12-08 DIAGNOSIS — R262 Difficulty in walking, not elsewhere classified: Secondary | ICD-10-CM

## 2023-12-08 DIAGNOSIS — R2689 Other abnormalities of gait and mobility: Secondary | ICD-10-CM

## 2023-12-08 DIAGNOSIS — R296 Repeated falls: Secondary | ICD-10-CM
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
## 2023-12-10 ENCOUNTER — Ambulatory Visit (HOSPITAL_COMMUNITY)

## 2023-12-10 ENCOUNTER — Encounter (HOSPITAL_COMMUNITY): Payer: Self-pay

## 2023-12-10 DIAGNOSIS — R296 Repeated falls: Secondary | ICD-10-CM

## 2023-12-10 DIAGNOSIS — R29898 Other symptoms and signs involving the musculoskeletal system: Secondary | ICD-10-CM

## 2023-12-10 DIAGNOSIS — R2689 Other abnormalities of gait and mobility: Secondary | ICD-10-CM

## 2023-12-10 DIAGNOSIS — R262 Difficulty in walking, not elsewhere classified: Secondary | ICD-10-CM
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
## 2023-12-15 ENCOUNTER — Encounter (HOSPITAL_COMMUNITY): Payer: Self-pay

## 2023-12-15 ENCOUNTER — Ambulatory Visit (HOSPITAL_COMMUNITY)

## 2023-12-15 DIAGNOSIS — R29898 Other symptoms and signs involving the musculoskeletal system: Secondary | ICD-10-CM

## 2023-12-15 DIAGNOSIS — R2689 Other abnormalities of gait and mobility: Secondary | ICD-10-CM | POA: Diagnosis not present

## 2023-12-15 DIAGNOSIS — R296 Repeated falls: Secondary | ICD-10-CM

## 2023-12-15 DIAGNOSIS — R262 Difficulty in walking, not elsewhere classified: Secondary | ICD-10-CM
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
## 2023-12-17 ENCOUNTER — Encounter (HOSPITAL_COMMUNITY): Payer: Self-pay

## 2023-12-17 ENCOUNTER — Ambulatory Visit (HOSPITAL_COMMUNITY)

## 2023-12-17 DIAGNOSIS — R29898 Other symptoms and signs involving the musculoskeletal system: Secondary | ICD-10-CM

## 2023-12-17 DIAGNOSIS — R262 Difficulty in walking, not elsewhere classified: Secondary | ICD-10-CM | POA: Diagnosis not present

## 2023-12-17 DIAGNOSIS — R296 Repeated falls: Secondary | ICD-10-CM | POA: Diagnosis not present

## 2023-12-17 DIAGNOSIS — R2689 Other abnormalities of gait and mobility: Secondary | ICD-10-CM | POA: Diagnosis not present
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
## 2023-12-22 ENCOUNTER — Ambulatory Visit (HOSPITAL_COMMUNITY): Admitting: Physical Therapy

## 2023-12-22 DIAGNOSIS — R296 Repeated falls: Secondary | ICD-10-CM | POA: Diagnosis not present

## 2023-12-22 DIAGNOSIS — R2689 Other abnormalities of gait and mobility: Secondary | ICD-10-CM | POA: Diagnosis not present

## 2023-12-22 DIAGNOSIS — R262 Difficulty in walking, not elsewhere classified: Secondary | ICD-10-CM

## 2023-12-22 DIAGNOSIS — R29898 Other symptoms and signs involving the musculoskeletal system: Secondary | ICD-10-CM | POA: Diagnosis not present
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
## 2023-12-24 ENCOUNTER — Encounter: Payer: Self-pay | Admitting: Radiology

## 2023-12-24 ENCOUNTER — Encounter (HOSPITAL_COMMUNITY): Payer: Self-pay

## 2023-12-24 ENCOUNTER — Ambulatory Visit (HOSPITAL_COMMUNITY)

## 2023-12-24 DIAGNOSIS — R2689 Other abnormalities of gait and mobility: Secondary | ICD-10-CM

## 2023-12-24 DIAGNOSIS — R262 Difficulty in walking, not elsewhere classified: Secondary | ICD-10-CM | POA: Diagnosis not present

## 2023-12-24 DIAGNOSIS — R296 Repeated falls: Secondary | ICD-10-CM | POA: Diagnosis not present

## 2023-12-24 DIAGNOSIS — R29898 Other symptoms and signs involving the musculoskeletal system: Secondary | ICD-10-CM
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
## 2023-12-28 ENCOUNTER — Ambulatory Visit: Payer: Medicare PPO | Attending: Internal Medicine | Admitting: Internal Medicine

## 2023-12-28 VITALS — BP 117/74 | HR 78 | Ht 68.0 in | Wt 263.2 lb

## 2023-12-28 DIAGNOSIS — I2510 Atherosclerotic heart disease of native coronary artery without angina pectoris: Secondary | ICD-10-CM | POA: Diagnosis not present

## 2023-12-28 DIAGNOSIS — I77810 Thoracic aortic ectasia: Secondary | ICD-10-CM

## 2023-12-28 DIAGNOSIS — E782 Mixed hyperlipidemia: Secondary | ICD-10-CM

## 2023-12-28 DIAGNOSIS — I5032 Chronic diastolic (congestive) heart failure: Secondary | ICD-10-CM | POA: Diagnosis not present

## 2023-12-28 DIAGNOSIS — E1159 Type 2 diabetes mellitus with other circulatory complications: Secondary | ICD-10-CM

## 2023-12-28 DIAGNOSIS — I152 Hypertension secondary to endocrine disorders: Secondary | ICD-10-CM | POA: Diagnosis not present

## 2023-12-28 NOTE — Progress Notes (Signed)
 Cardiology Office Note:  .    Date:  12/28/2023  ID:  Julie Bean, DOB Sep 12, 1950, MRN [id number] PCP: Tobi Fortes, MD  [facility] Providers Cardiologist:  Jann Melody, MD     CC: Follow up post APP eval   History of Present Illness: Julie Bean    Julie Bean is a 73 y.o. female with heart failure with preserved ejection fraction and morbid obesity who presents with shortness of breath.  She experiences shortness of breath, which is multifactorial, attributed to both morbid obesity and heart failure with preserved ejection fraction. Her breathing is stable but not optimal, and she finds it particularly limiting during activities such as jogging up stairs. She is cautious on stairs due to a fear of falling, which has impacted her activity level.  She has a history of heart failure with preserved ejection fraction and was previously on diuretics. Earlier this year, she paused her diuretics, leading to an exacerbation of her heart failure symptoms, necessitating an increase in her torsemide  dosage. She is currently taking torsemide  and Farxiga , and her blood pressure is well controlled.  Earlier this year, she experienced a fall on February 8th, resulting in a broken nose and concussion. She has been undergoing physical therapy and uses a cane to prevent future falls. She is concerned about a spot on her leg that developed after she stopped her diuretics, which was initially swollen, red, and appeared to have pus. It has since improved with antibiotic treatment.  She is currently taking Vascepa , which has caused some digestive tract issues, but she is managing these symptoms.  Discussed the use of AI scribe software for clinical note transcription with the patient, who gave verbal consent to proceed.     Relevant histories: .  Social - from [place], prefers [place] 2023: started SGLT2i.  CCTA with mild non obstructive disease 2024: Did worse with pulmonary rehab.   It was minimally helpful. Education was all aimed at home O2.  Had some improvements on heart healthy eating.  ROS: As per HPI.   Studies Reviewed: .   Cardiac Studies & Procedures   ______________________________________________________________________________________________     ECHOCARDIOGRAM  ECHOCARDIOGRAM COMPLETE 04/08/2023  Narrative ECHOCARDIOGRAM REPORT    Patient Name:   [name] Date of Exam: 04/08/2023 Medical Rec #:  [id number]        Height:       68.0 in Accession #:    [id number]       Weight:       252.0 lb Date of Birth:  10/30/1950         BSA:          2.255 m Patient Age:    72 years         BP:           127/79 mmHg Patient Gender: F                HR:           79 bpm. Exam Location:  [facility]  Procedure: 2D Echo, Cardiac Doppler and Color Doppler  Indications:    Heart failure (HCC) [[id number]]  History:        Patient has prior history of Echocardiogram examinations, most recent 07/16/2021. AO Root Dilation and CHF, CAD; Risk Factors:Hypertension, Diabetes, Dyslipidemia and Non-Smoker.  Sonographer:    Daune Eric Referring Phys: [id number] [facility] A Paulita Boss   Sonographer Comments: Suboptimal subcostal window. Image acquisition challenging due to patient body habitus  and Image acquisition challenging due to respiratory motion. IMPRESSIONS   1. Left ventricular ejection fraction, by estimation, is 60 to 65%. The left ventricle has normal function. The left ventricle has no regional wall motion abnormalities. Left ventricular diastolic parameters are consistent with Grade I diastolic dysfunction (impaired relaxation). 2. Right ventricular systolic function is normal. The right ventricular size is normal. There is normal pulmonary artery systolic pressure. 3. The mitral valve is normal in structure. Trivial mitral valve regurgitation. No evidence of mitral stenosis. 4. The aortic valve is tricuspid. There is mild thickening of the aortic  valve. Aortic valve regurgitation is not visualized. Aortic valve sclerosis/calcification is present, without any evidence of aortic stenosis. 5. Aortic dilatation noted. There is borderline dilatation of the aortic root, measuring 37 mm. There is mild dilatation of the ascending aorta, measuring 43 mm. 6. The inferior vena cava is normal in size with greater than 50% respiratory variability, suggesting right atrial pressure of 3 mmHg.  Comparison(s): No significant change from prior study.  FINDINGS Left Ventricle: Left ventricular ejection fraction, by estimation, is 60 to 65%. The left ventricle has normal function. The left ventricle has no regional wall motion abnormalities. The left ventricular internal cavity size was normal in size. There is no left ventricular hypertrophy. Left ventricular diastolic parameters are consistent with Grade I diastolic dysfunction (impaired relaxation).  Right Ventricle: The right ventricular size is normal. No increase in right ventricular wall thickness. Right ventricular systolic function is normal. There is normal pulmonary artery systolic pressure. The tricuspid regurgitant velocity is 0.94 m/s, and with an assumed right atrial pressure of 3 mmHg, the estimated right ventricular systolic pressure is 6.5 mmHg.  Left Atrium: Left atrial size was normal in size.  Right Atrium: Right atrial size was normal in size.  Pericardium: There is no evidence of pericardial effusion.  Mitral Valve: The mitral valve is normal in structure. Trivial mitral valve regurgitation. No evidence of mitral valve stenosis. MV peak gradient, 3.5 mmHg. The mean mitral valve gradient is 1.0 mmHg.  Tricuspid Valve: The tricuspid valve is not well visualized. Tricuspid valve regurgitation is not demonstrated. No evidence of tricuspid stenosis.  Aortic Valve: The aortic valve is tricuspid. There is mild thickening of the aortic valve. Aortic valve regurgitation is not visualized.  Aortic valve sclerosis/calcification is present, without any evidence of aortic stenosis.  Pulmonic Valve: The pulmonic valve was not well visualized. Pulmonic valve regurgitation is not visualized. No evidence of pulmonic stenosis.  Aorta: Aortic dilatation noted. There is borderline dilatation of the aortic root, measuring 37 mm. There is mild dilatation of the ascending aorta, measuring 43 mm.  Venous: The inferior vena cava is normal in size with greater than 50% respiratory variability, suggesting right atrial pressure of 3 mmHg.  IAS/Shunts: No atrial level shunt detected by color flow Doppler.   LEFT VENTRICLE PLAX 2D LVIDd:         5.30 cm   Diastology LVIDs:         3.40 cm   LV e' medial:    8.24 cm/s LV PW:         1.20 cm   LV E/e' medial:  10.3 LV IVS:        1.00 cm   LV e' lateral:   8.70 cm/s LVOT diam:     2.10 cm   LV E/e' lateral: 9.7 LV SV:         81 LV SV Index:   36 LVOT Area:  3.46 cm   RIGHT VENTRICLE RV S prime:     14.00 cm/s TAPSE (M-mode): 1.4 cm  LEFT ATRIUM             Index        RIGHT ATRIUM           Index LA diam:        3.50 cm 1.55 cm/m   RA Area:     13.90 cm LA Vol (A2C):   43.2 ml 19.16 ml/m  RA Volume:   33.60 ml  14.90 ml/m LA Vol (A4C):   29.8 ml 13.22 ml/m LA Biplane Vol: 35.6 ml 15.79 ml/m AORTIC VALVE LVOT Vmax:   104.00 cm/s LVOT Vmean:  73.700 cm/s LVOT VTI:    0.233 m  AORTA Ao Root diam: 3.70 cm Ao Asc diam:  4.17 cm  MITRAL VALVE                TRICUSPID VALVE MV Area (PHT): 3.28 cm     TR Peak grad:   3.5 mmHg MV Area VTI:   2.06 cm     TR Vmax:        94.10 cm/s MV Peak grad:  3.5 mmHg MV Mean grad:  1.0 mmHg     SHUNTS MV Vmax:       0.94 m/s     Systemic VTI:  0.23 m MV Vmean:      56.2 cm/s    Systemic Diam: 2.10 cm MV Decel Time: 232 msec MR Peak grad: 28.0 mmHg MR Vmax:      264.50 cm/s MV E velocity: 84.80 cm/s MV A velocity: 102.50 cm/s MV E/A ratio:  0.83  Vishnu Priya  Mallipeddi Electronically signed by Lucetta Russel Mallipeddi Signature Date/Time: 04/08/2023/2:55:49 PM    Final      CT SCANS  CT CORONARY MORPH W/CTA COR W/SCORE 04/25/2022  Addendum 04/25/2022  4:53 PM ADDENDUM REPORT: 04/25/2022 16:50  EXAM: OVER-READ INTERPRETATION  CT CHEST  The following report is an over-read performed by radiologist Dr. Franco Isaac [facility] on 04/25/2022. This over-read does not include interpretation of cardiac or coronary anatomy or pathology. The coronary CTA interpretation by the cardiologist is attached.  COMPARISON:  04/22/2022  FINDINGS: Heart size is upper limits of normal. No pericardial effusion. Mid ascending thoracic aorta measures 4.1 cm in diameter. Central pulmonary vasculature is within normal limits.  No adenopathy within the included chest. Included lung fields are clear.  No acute findings within the included upper abdomen. No significant bony or chest wall abnormality.  IMPRESSION: The ascending thoracic aorta measures up to 4.1 cm. Recommend annual imaging followup by CTA or MRA. This recommendation follows 2010 ACCF/AHA/AATS/ACR/ASA/SCA/SCAI/SIR/STS/SVM Guidelines for the Diagnosis and Management of Patients with Thoracic Aortic Disease. Circulation. 2010; 121: Z610-R604. Aortic aneurysm NOS (ICD10-I71.9)   Electronically Signed By: Leverne Reading D.O. On: 04/25/2022 16:50  Narrative CLINICAL DATA:  56F with chest pain  EXAM: Cardiac/Coronary CTA  TECHNIQUE: The patient was scanned on a [employer].  FINDINGS: A 100 kV prospective scan was triggered in the descending thoracic aorta at 111 HU's. Axial non-contrast 3 mm slices were carried out through the heart. The data set was analyzed on a dedicated work station and scored using the Agatson method. Gantry rotation speed was 250 msecs and collimation was .6 mm. 0.8 mg of sl NTG was given. The 3D data set was reconstructed in  5% intervals of the 35-75 % of the R-R  cycle. Phases were analyzed on a dedicated work station using MPR, MIP and VRT modes. The patient received 100 cc of contrast.  Coronary Arteries:  Normal coronary origin.  Right dominance.  RCA is a large dominant artery that gives rise to PDA and PLA. Mixed plaque in the proximal RCA causes 0-24% stenosis. Calcified plaque in the mid RCA causes 0-24% stenosis. Calcified plaque in the distal RCA causes 0-24% stenosis  Left main is a large artery that gives rise to LAD and LCX arteries.  LAD is a large vessel. Mixed plaque in the proximal LAD causes 25-49% stenosis. High risk plaque features including low attenuation plaque and napkin ring sign. Calcified plaque in the mid LAD causes 0-24% stenosis. Calcicfied plaque at ostial D1 causes 25-49% stenosis  LCX is a non-dominant artery that gives rise to one large OM1 branch. Calcified plaque in the proximal LCX causes 0-24% stenosis. Calcified plaque in the mid LCX causes 25-49% stenosis. Calcified plaque in the distal LCX causes 25-49% stenosis  Other findings:  Left Ventricle: Mild dilatation  Left Atrium: Mild enlargement  Pulmonary Veins: Normal configuration  Right Ventricle: Mild dilatation  Right Atrium: Normal size  Thoracic aorta: Ascending aorta measures 41mm  Pulmonary Arteries: Normal size  Systemic Veins: Normal drainage  Pericardium: Normal thickness  IMPRESSION: 1. Coronary calcium  score of 405. This was 90th percentile for age and sex matched control.  2.  Normal coronary origin with right dominance.  3.  Nonobstructive CAD  4. Mixed plaque in the proximal LAD causes mild (25-49%) stenosis, with high risk plaque features including low attenuation plaque and napkin ring sign. Calcified plaque in the mid LAD causes minimal (0-24%) stenosis. Calcified plaque at ostial D1 causes mild (25-49%) stenosis  5. Calcified plaque in the proximal LCX causes minimal  (0-24%) stenosis. Calcified plaque in the mid and distal LCX causes mild (25-49%) stenosis.  6. Mixed plaque in proximal RCA causes minimal (0-24%) stenosis. Calcified plaque in mid and distal RCA causes minimal (0-24%) stenosis  7.  Dilated ascending aorta measures 41mm  .  CAD-RADS 2. Mild non-obstructive CAD (25-49%). Consider non-atherosclerotic causes of chest pain. Consider preventive therapy and risk factor modification.  Electronically Signed: By: Carson Clara M.D. On: 04/25/2022 15:09   CT SCANS  CT CORONARY MORPH W/CTA COR W/SCORE 04/22/2022  Narrative EXAM: OVER-READ INTERPRETATION  CT CHEST  The following report is a limited chest CT over-read performed by radiologist Dr. Tama Fails of [facility] on 04/22/2022. This over-read does not include interpretation of cardiac or coronary anatomy or pathology. The CT coronary calcium  and CTA coronary arteries interpretation by the cardiologist is attached.  COMPARISON:  CT chest April 22, 2022.  FINDINGS: Vascular: Fusiform aneurysmal dilation of the ascending aorta measures 4 cm. Aortic atherosclerosis .  Mediastinum/Nodes: No pathologically enlarged mediastinal or hilar lymph nodes in the visualized portions of the thorax. Distal esophagus is grossly unremarkable.  Lungs/Pleura: Within visualized portions of the thorax there are no suspicious pulmonary nodules or masses, no focal airspace consolidation no pleural effusion and no pneumothorax.  Upper Abdomen: No acute finding.  Musculoskeletal: No acute osseous abnormality. Multilevel degenerative changes spine with bridging anterior vertebral osteophytes.  IMPRESSION: 1. 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 Recommend annual imaging followup by CTA or MRA. This recommendation follows 2010 ACCF/AHA/AATS/ACR/ASA/SCA/SCAI/SIR/STS/SVM Guidelines for the Diagnosis and Management of Patients with  Thoracic Aortic Disease. Circulation. 2010; 121: Y865-H846. Aortic aneurysm NOS (ICD10-I71.9) 2.  Aortic Atherosclerosis (ICD10-I70.0).  Electronically Signed By: Tama Fails M.D. On: 04/23/2022 09:05     ______________________________________________________________________________________________        Physical Exam:    VS:  BP 117/74 (BP Location: Right Arm)   Pulse 78   Ht 5\' 8"  (1.727 m)   Wt 119.4 kg   SpO2 95%   BMI 40.02 kg/m    Wt Readings from Last 3 Encounters:  12/28/23 119.4 kg  11/19/23 118.5 kg  11/18/23 117.4 kg    Gen: no distress, morbid obesity   Neck: No JVD Cardiac: No Rubs or Gallops, no murmur, RRR + 2 radial pulses distant heart sounds Respiratory: Clear to auscultation bilaterally, normal effort, normal  respiratory rate GI: Soft, nontender, non-distended  MS: No  edema;  moves all extremities Integument: Small red macule on left leg, improved per patient report Neuro:  At time of evaluation, alert and oriented to person/place/time/situation  Psych: Depressed affect   ASSESSMENT AND PLAN: .    Heart failure with preserved ejection fraction, Chronic - NYHA II, Stage C Heart failure with preserved ejection fraction, both acute and chronic, with shortness of breath due to cardiac and non-cardiac factors. Blood pressure is well controlled. Recent diuretic pause led to exacerbation, resolved with increased torsemide . Breathing is suboptimal but well-managed. Emphasis on maintaining activity to prevent deconditioning. - Continue torsemide  and Farxiga  - Encourage physical activity to prevent deconditioning  HTN with DM: Well controlled on curreng therapy  Morbid obesity Morbid obesity contributes to shortness of breath. Discussed potential use of Wegovy or Ozempic. No immediate changes, but open to future consideration if breathing worsens. Encouraged to research these options. - no history of MEN or medullary thyroid  cancer - no history of  pancreatitis or gallstones. 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 Reports tolerable digestive issues with Vascepa . - continue current therapy  Aortic dilation - Echo this September as ordered by Lovette Rud PA-C  Gloriann Larger, MD FASE [facility] Cardiologist [facility]  [facility]  [address] [place], [address] [place], [place] [address] [phone]  10:50 AM

## 2023-12-28 NOTE — Patient Instructions (Signed)

## 2023-12-29 ENCOUNTER — Encounter (HOSPITAL_COMMUNITY): Admitting: Physical Therapy

## 2023-12-31 DIAGNOSIS — H25813 Combined forms of age-related cataract, bilateral: Secondary | ICD-10-CM | POA: Diagnosis not present

## 2023-12-31 DIAGNOSIS — H353132 Nonexudative age-related macular degeneration, bilateral, intermediate dry stage: Secondary | ICD-10-CM | POA: Diagnosis not present

## 2024-01-08 DIAGNOSIS — E1136 Type 2 diabetes mellitus with diabetic cataract: Secondary | ICD-10-CM | POA: Diagnosis not present

## 2024-01-08 DIAGNOSIS — E039 Hypothyroidism, unspecified: Secondary | ICD-10-CM | POA: Diagnosis not present

## 2024-01-08 DIAGNOSIS — H25811 Combined forms of age-related cataract, right eye: Secondary | ICD-10-CM | POA: Diagnosis not present

## 2024-01-22 DIAGNOSIS — I509 Heart failure, unspecified: Secondary | ICD-10-CM | POA: Diagnosis not present

## 2024-01-22 DIAGNOSIS — I2510 Atherosclerotic heart disease of native coronary artery without angina pectoris: Secondary | ICD-10-CM | POA: Diagnosis not present

## 2024-01-22 DIAGNOSIS — I110 Hypertensive heart disease with heart failure: Secondary | ICD-10-CM | POA: Diagnosis not present

## 2024-01-22 DIAGNOSIS — H25812 Combined forms of age-related cataract, left eye: Secondary | ICD-10-CM | POA: Diagnosis not present

## 2024-02-10 NOTE — Telephone Encounter (Signed)
Faxed for records

## 2024-02-11 ENCOUNTER — Other Ambulatory Visit: Payer: Self-pay | Admitting: Internal Medicine

## 2024-02-11 DIAGNOSIS — E1165 Type 2 diabetes mellitus with hyperglycemia: Secondary | ICD-10-CM

## 2024-02-16 DIAGNOSIS — M25511 Pain in right shoulder: Secondary | ICD-10-CM | POA: Diagnosis not present

## 2024-02-16 DIAGNOSIS — M4722 Other spondylosis with radiculopathy, cervical region: Secondary | ICD-10-CM | POA: Diagnosis not present

## 2024-02-22 ENCOUNTER — Other Ambulatory Visit (HOSPITAL_COMMUNITY): Payer: Self-pay

## 2024-03-27 DIAGNOSIS — I509 Heart failure, unspecified: Secondary | ICD-10-CM | POA: Insufficient documentation

## 2024-04-04 ENCOUNTER — Ambulatory Visit: Payer: Self-pay

## 2024-04-04 NOTE — Telephone Encounter (Signed)
 FYI Only or Action Required?: FYI only for provider.  Patient was last seen in primary care on 11/18/2023 by Melvenia Manus BRAVO, MD.  Called Nurse Triage reporting Leg Swelling.  Symptoms began a week ago.  Interventions attempted: Prescription medications: torsemide  .  Symptoms are: gradually worsening.  Triage Disposition: See PCP When Office is Open (Within 3 Days)  Patient/caregiver understands and will follow disposition?: Yes        Copied from CRM #[id number]. Topic: Clinical - Red Word Triage >> Apr 04, 2024  2:30 PM [place] MATSU wrote: Red Word that prompted transfer to Nurse Triage: Severe swellings in legs for more than a week its getting worse. Reason for Disposition  [1] MILD swelling of both ankles (i.e., pedal edema) AND [2] new-onset or getting worse  Answer Assessment - Initial Assessment Questions 1. ONSET: When did the swelling start? (e.g., minutes, hours, days)     Chronic but worsened in last 10 days 2. LOCATION: What part of the leg is swollen?  Are both legs swollen or just one leg?     Both legs and feet 3. SEVERITY: How bad is the swelling? (e.g., localized; mild, moderate, severe)     Mod-severe 4. REDNESS: Is there redness or signs of infection?     no 5. PAIN: Is the swelling painful to touch? If Yes, ask: How painful is it?   (Scale 1-10; mild, moderate or severe)     Uncomfortable due to skin feels tight 6. FEVER: Do you have a fever? If Yes, ask: What is it, how was it measured, and when did it start?      no 7. CAUSE: What do you think is causing the leg swelling?     chronic 8. MEDICAL HISTORY: Do you have a history of blood clots (e.g., DVT), cancer, heart failure, kidney disease, or liver failure?     chf 9. RECURRENT SYMPTOM: Have you had leg swelling before? If Yes, ask: When was the last time? What happened that time?      10. OTHER SYMPTOMS: Do you have any other symptoms? (e.g., chest pain, difficulty  breathing)       no  Protocols used: Leg Swelling and Edema-A-AH

## 2024-04-04 NOTE — Telephone Encounter (Signed)
 Patient scheduled.

## 2024-04-06 ENCOUNTER — Ambulatory Visit (INDEPENDENT_AMBULATORY_CARE_PROVIDER_SITE_OTHER)

## 2024-04-06 VITALS — BP 130/76 | HR 83 | Ht 67.0 in | Wt 266.0 lb

## 2024-04-06 DIAGNOSIS — K219 Gastro-esophageal reflux disease without esophagitis: Secondary | ICD-10-CM | POA: Diagnosis not present

## 2024-04-06 DIAGNOSIS — E1165 Type 2 diabetes mellitus with hyperglycemia: Secondary | ICD-10-CM

## 2024-04-06 DIAGNOSIS — I152 Hypertension secondary to endocrine disorders: Secondary | ICD-10-CM

## 2024-04-06 DIAGNOSIS — M4810 Ankylosing hyperostosis [Forestier], site unspecified: Secondary | ICD-10-CM

## 2024-04-06 DIAGNOSIS — Z7984 Long term (current) use of oral hypoglycemic drugs: Secondary | ICD-10-CM

## 2024-04-06 DIAGNOSIS — G8929 Other chronic pain: Secondary | ICD-10-CM

## 2024-04-06 DIAGNOSIS — E785 Hyperlipidemia, unspecified: Secondary | ICD-10-CM | POA: Diagnosis not present

## 2024-04-06 DIAGNOSIS — E1159 Type 2 diabetes mellitus with other circulatory complications: Secondary | ICD-10-CM

## 2024-04-06 DIAGNOSIS — I5032 Chronic diastolic (congestive) heart failure: Secondary | ICD-10-CM | POA: Diagnosis not present

## 2024-04-06 DIAGNOSIS — E039 Hypothyroidism, unspecified: Secondary | ICD-10-CM | POA: Diagnosis not present

## 2024-04-06 DIAGNOSIS — I10 Essential (primary) hypertension: Secondary | ICD-10-CM

## 2024-04-06 MED ORDER — ROSUVASTATIN CALCIUM 10 MG PO TABS: 10.0000 mg | ORAL_TABLET | Freq: Every day | ORAL | 3 refills | Status: AC

## 2024-04-06 MED ORDER — INDOMETHACIN 50 MG PO CAPS: 50.0000 mg | ORAL_CAPSULE | Freq: Two times a day (BID) | ORAL | 3 refills | Status: AC

## 2024-04-06 MED ORDER — METFORMIN HCL 500 MG PO TABS: 1000.0000 mg | ORAL_TABLET | Freq: Two times a day (BID) | ORAL | 3 refills | Status: AC

## 2024-04-06 MED ORDER — LEVOTHYROXINE SODIUM 175 MCG PO TABS: ORAL_TABLET | ORAL | 3 refills | Status: AC

## 2024-04-06 MED ORDER — GLIPIZIDE ER 10 MG PO TB24
ORAL_TABLET | ORAL | 3 refills | Status: AC
Start: 2024-04-06 — End: ?

## 2024-04-06 MED ORDER — OMEPRAZOLE 40 MG PO CPDR: 40.0000 mg | DELAYED_RELEASE_CAPSULE | Freq: Every day | ORAL | 3 refills | Status: AC

## 2024-04-06 MED ORDER — TORSEMIDE 20 MG PO TABS: 40.0000 mg | ORAL_TABLET | Freq: Every day | ORAL | 3 refills | Status: AC

## 2024-04-06 MED ORDER — METOPROLOL SUCCINATE ER 25 MG PO TB24: 25.0000 mg | ORAL_TABLET | Freq: Every day | ORAL | 3 refills | Status: AC

## 2024-04-06 NOTE — Progress Notes (Signed)
 Established Patient Office Visit  Subjective   Patient ID: Julie Bean, female    DOB: 07-20-1951  Age: 73 y.o. MRN: [id number]  Chief Complaint  Patient presents with   Medical Management of Chronic Issues     bilateral feet and ankle swelling pt states can wear regular shoes, always had some mild swelling but this has gotten really noticeable in the last 14 days     HPI  ROS    Objective:     BP 130/76   Pulse 83   Ht 5' 7 (1.702 m)   Wt 266 lb 0.6 oz (120.7 kg)   SpO2 97%   BMI 41.67 kg/m  BP Readings from Last 3 Encounters:  04/06/24 130/76  12/28/23 117/74  11/19/23 119/71   Wt Readings from Last 3 Encounters:  04/06/24 266 lb 0.6 oz (120.7 kg)  12/28/23 263 lb 3.2 oz (119.4 kg)  11/19/23 261 lb 4.8 oz (118.5 kg)      Physical Exam Vitals and nursing note reviewed.  Constitutional:      Appearance: Normal appearance. She is obese.  HENT:     Head: Normocephalic.  Eyes:     Extraocular Movements: Extraocular movements intact.     Pupils: Pupils are equal, round, and reactive to light.  Cardiovascular:     Rate and Rhythm: Normal rate and regular rhythm.  Pulmonary:     Effort: Pulmonary effort is normal.     Breath sounds: Normal breath sounds.  Musculoskeletal:     Cervical back: Normal range of motion and neck supple.     Right ankle: Swelling present.     Left ankle: Swelling present.     Right foot: Swelling present.     Left foot: Swelling present.  Neurological:     Mental Status: She is alert and oriented to person, place, and time.  Psychiatric:        Mood and Affect: Mood normal.        Thought Content: Thought content normal.    No results found for any visits on 04/06/24.  Last CBC Lab Results  Component Value Date   WBC 7.1 07/31/2022   HGB 11.7 07/31/2022   HCT 36.0 07/31/2022   MCV 89 07/31/2022   MCH 29.0 07/31/2022   RDW 15.5 (H) 07/31/2022   PLT 242 07/31/2022   Last metabolic panel Lab Results  Component  Value Date   GLUCOSE 125 (H) 09/18/2023   NA 137 09/18/2023   K 4.8 09/18/2023   CL 96 09/18/2023   CO2 24 09/18/2023   BUN 36 (H) 09/18/2023   CREATININE 1.25 (H) 09/18/2023   EGFR 46 (L) 09/18/2023   CALCIUM  8.7 09/18/2023   PROT 6.2 09/18/2023   ALBUMIN 3.7 (L) 09/18/2023   LABGLOB 2.5 09/18/2023   AGRATIO 1.8 07/31/2022   BILITOT 0.3 09/18/2023   ALKPHOS 69 09/18/2023   AST 22 09/18/2023   ALT 17 09/18/2023   ANIONGAP 8 04/25/2022   Last lipids Lab Results  Component Value Date   CHOL 189 11/26/2023   HDL 51 11/26/2023   LDLCALC 80 11/26/2023   TRIG 363 (H) 11/26/2023   CHOLHDL 3.7 11/26/2023   Last hemoglobin A1c Lab Results  Component Value Date   HGBA1C 7.2 (H) 11/18/2023   Last thyroid  functions Lab Results  Component Value Date   TSH 8.630 (H) 11/18/2023   Last vitamin D  Lab Results  Component Value Date   VD25OH 34.9 11/18/2023  The 10-year ASCVD risk score (Arnett DK, et al., 2019) is: 28.5%    Assessment & Plan:   Problem List Items Addressed This Visit       Cardiovascular and Mediastinum   Hypertension associated with diabetes (HCC)   Remains adequately controlled on current antihypertensive regimen.  No medication changes are indicated today.      Relevant Medications   glipiZIDE  (GLUCOTROL  XL) 10 MG 24 hr tablet   metFORMIN  (GLUCOPHAGE ) 500 MG tablet   metoprolol  succinate (TOPROL -XL) 25 MG 24 hr tablet   rosuvastatin  (CRESTOR ) 10 MG tablet   torsemide  (DEMADEX ) 20 MG tablet   Other Relevant Orders   CMP14+EGFR   Lipid panel   Chronic diastolic heart failure (HCC)   Stable with current medications.  Recommend wearing compression socks during the day and taking off at bedtime, to help with swelling in ankles and feet.  Keep elevated when at rest.      Relevant Medications   metoprolol  succinate (TOPROL -XL) 25 MG 24 hr tablet   rosuvastatin  (CRESTOR ) 10 MG tablet   torsemide  (DEMADEX ) 20 MG tablet     Digestive    Esophageal reflux   Currently stable with omeprazole  40 mg.  No medication changes made today.      Relevant Medications   omeprazole  (PRILOSEC) 40 MG capsule     Endocrine   Type 2 diabetes mellitus with hyperglycemia (HCC) - Primary   A1C was 7.2 in March.  She is currently prescribed glipizide  10 mg daily, Farxiga  10 mg daily, and metformin  1000 mg twice daily.  Repeat A1c and urine microalbumin/creatinine ratio ordered today      Relevant Medications   glipiZIDE  (GLUCOTROL  XL) 10 MG 24 hr tablet   metFORMIN  (GLUCOPHAGE ) 500 MG tablet   rosuvastatin  (CRESTOR ) 10 MG tablet   Other Relevant Orders   CMP14+EGFR   Hemoglobin A1c   Microalbumin / creatinine urine ratio   Lipid panel   Hypothyroidism   She is currently prescribed levothyroxine  175 mcg daily except on Mondays and Fridays where she takes half a tablet (87.5 mcg).  She remains asymptomatic.  Repeat thyroid  studies ordered today.      Relevant Medications   levothyroxine  (SYNTHROID ) 175 MCG tablet   metoprolol  succinate (TOPROL -XL) 25 MG 24 hr tablet   Other Relevant Orders   TSH + free T4     Musculoskeletal and Integument   DISH (diffuse idiopathic skeletal hyperostosis)   Chronic condition followed by orthopedics Patient encouraged to use walker to help prevent falls due to her gait instability, she prefers to follow up with otho first before making the decision to use a walker.  Need to prevent falls including risk of fractures discussed      Relevant Medications   indomethacin  (INDOCIN ) 50 MG capsule     Other   Hyperlipidemia   Repeat lipid panel today.  Continue with current medications.  No changes made at this time.  Recommend follow-up in 6 months or sooner if labs indicate.       Relevant Medications   metoprolol  succinate (TOPROL -XL) 25 MG 24 hr tablet   rosuvastatin  (CRESTOR ) 10 MG tablet   torsemide  (DEMADEX ) 20 MG tablet   Chronic pain   Indomethacin  refilled for prn use.       Relevant  Medications   indomethacin  (INDOCIN ) 50 MG capsule    Return in about 3 months (around 07/07/2024) for chronic follow-up with PCP.    Leita Longs, FNP

## 2024-04-14 NOTE — Assessment & Plan Note (Signed)
 She is currently prescribed levothyroxine  175 mcg daily except on Mondays and Fridays where she takes half a tablet (87.5 mcg).  She remains asymptomatic.  Repeat thyroid  studies ordered today.

## 2024-04-14 NOTE — Assessment & Plan Note (Signed)
 Chronic condition followed by orthopedics Patient encouraged to use walker to help prevent falls due to her gait instability, she prefers to follow up with otho first before making the decision to use a walker.  Need to prevent falls including risk of fractures discussed

## 2024-04-14 NOTE — Assessment & Plan Note (Signed)
 Repeat lipid panel today.  Continue with current medications.  No changes made at this time.  Recommend follow-up in 6 months or sooner if labs indicate.

## 2024-04-14 NOTE — Assessment & Plan Note (Signed)
 Remains adequately controlled on current antihypertensive regimen.  No medication changes are indicated today.

## 2024-04-14 NOTE — Assessment & Plan Note (Signed)
 Currently stable with omeprazole  40 mg.  No medication changes made today.

## 2024-04-14 NOTE — Assessment & Plan Note (Signed)
 A1C was 7.2 in March.  She is currently prescribed glipizide  10 mg daily, Farxiga  10 mg daily, and metformin  1000 mg twice daily.  Repeat A1c and urine microalbumin/creatinine ratio ordered today

## 2024-04-14 NOTE — Assessment & Plan Note (Signed)
 Stable with current medications.  Recommend wearing compression socks during the day and taking off at bedtime, to help with swelling in ankles and feet.  Keep elevated when at rest.

## 2024-04-14 NOTE — Telephone Encounter (Signed)
 The patient is asking for her after visit summary so she can view it in [place]. Please advise if this will be completed.

## 2024-04-14 NOTE — Assessment & Plan Note (Signed)
 Indomethacin  refilled for prn use.

## 2024-05-20 ENCOUNTER — Ambulatory Visit

## 2024-05-24 ENCOUNTER — Other Ambulatory Visit (HOSPITAL_COMMUNITY): Payer: Self-pay

## 2024-05-26 ENCOUNTER — Telehealth: Payer: Self-pay | Admitting: Pharmacy Technician

## 2024-05-26 MED ORDER — DAPAGLIFLOZIN PROPANEDIOL 10 MG PO TABS: 10.0000 mg | ORAL_TABLET | Freq: Every day | ORAL | 3 refills | Status: AC

## 2024-05-26 NOTE — Telephone Encounter (Signed)
 Hi, we received a request for a refill to be sent in for farxiga  to [facility].  [facility] - [place], [place] - [address] [place] [address]  Phone:?[phone]??Fax:?[phone]

## 2024-05-26 NOTE — Telephone Encounter (Signed)
 Pt's medication was sent to pt's pharmacy as requested. Confirmation received.

## 2024-05-26 NOTE — Addendum Note (Signed)
 Addended by: BLUFORD RAMP D on: 05/26/2024 04:29 PM   Modules accepted: Orders

## 2024-06-06 ENCOUNTER — Telehealth: Payer: Self-pay | Admitting: Pharmacy Technician

## 2024-06-06 NOTE — Telephone Encounter (Signed)
 SABRA

## 2024-06-07 ENCOUNTER — Other Ambulatory Visit: Payer: Self-pay | Admitting: Adult Health Nurse Practitioner

## 2024-06-07 DIAGNOSIS — E1140 Type 2 diabetes mellitus with diabetic neuropathy, unspecified: Secondary | ICD-10-CM | POA: Diagnosis not present

## 2024-06-07 DIAGNOSIS — Z1231 Encounter for screening mammogram for malignant neoplasm of breast: Secondary | ICD-10-CM

## 2024-06-07 DIAGNOSIS — Z0000 Encounter for general adult medical examination without abnormal findings: Secondary | ICD-10-CM | POA: Diagnosis not present

## 2024-06-07 DIAGNOSIS — E039 Hypothyroidism, unspecified: Secondary | ICD-10-CM | POA: Diagnosis not present

## 2024-06-07 DIAGNOSIS — E1165 Type 2 diabetes mellitus with hyperglycemia: Secondary | ICD-10-CM | POA: Diagnosis not present

## 2024-06-07 DIAGNOSIS — M4810 Ankylosing hyperostosis [Forestier], site unspecified: Secondary | ICD-10-CM | POA: Diagnosis not present

## 2024-06-07 DIAGNOSIS — G44049 Chronic paroxysmal hemicrania, not intractable: Secondary | ICD-10-CM | POA: Diagnosis not present

## 2024-06-07 DIAGNOSIS — E559 Vitamin D deficiency, unspecified: Secondary | ICD-10-CM | POA: Diagnosis not present

## 2024-06-07 DIAGNOSIS — E1122 Type 2 diabetes mellitus with diabetic chronic kidney disease: Secondary | ICD-10-CM | POA: Diagnosis not present

## 2024-06-07 DIAGNOSIS — N1832 Chronic kidney disease, stage 3b: Secondary | ICD-10-CM | POA: Diagnosis not present

## 2024-06-07 DIAGNOSIS — N183 Chronic kidney disease, stage 3 unspecified: Secondary | ICD-10-CM | POA: Diagnosis not present

## 2024-07-05 ENCOUNTER — Ambulatory Visit
Admission: RE | Admit: 2024-07-05 | Discharge: 2024-07-05 | Disposition: A | Source: Ambulatory Visit | Attending: Adult Health Nurse Practitioner

## 2024-07-05 DIAGNOSIS — Z1231 Encounter for screening mammogram for malignant neoplasm of breast: Secondary | ICD-10-CM

## 2024-07-19 DIAGNOSIS — M4810 Ankylosing hyperostosis [Forestier], site unspecified: Secondary | ICD-10-CM | POA: Diagnosis not present

## 2024-07-21 ENCOUNTER — Ambulatory Visit

## 2024-08-18 ENCOUNTER — Other Ambulatory Visit (HOSPITAL_COMMUNITY): Payer: Self-pay
